# Patient Record
Sex: Female | Born: 1935 | Race: White | Hispanic: No | Marital: Married | Smoking: Never smoker
Health system: Southern US, Community
[De-identification: ages and names within clinical notes are randomized; demographics above are authoritative.]

## PROBLEM LIST (undated history)

## (undated) DIAGNOSIS — M5412 Radiculopathy, cervical region: Secondary | ICD-10-CM

## (undated) DIAGNOSIS — I10 Essential (primary) hypertension: Secondary | ICD-10-CM

## (undated) DIAGNOSIS — K589 Irritable bowel syndrome without diarrhea: Secondary | ICD-10-CM

## (undated) DIAGNOSIS — T7840XA Allergy, unspecified, initial encounter: Secondary | ICD-10-CM

## (undated) DIAGNOSIS — H269 Unspecified cataract: Secondary | ICD-10-CM

## (undated) DIAGNOSIS — D649 Anemia, unspecified: Secondary | ICD-10-CM

## (undated) DIAGNOSIS — I059 Rheumatic mitral valve disease, unspecified: Secondary | ICD-10-CM

## (undated) DIAGNOSIS — E039 Hypothyroidism, unspecified: Secondary | ICD-10-CM

## (undated) DIAGNOSIS — Z7901 Long term (current) use of anticoagulants: Secondary | ICD-10-CM

## (undated) DIAGNOSIS — Z5189 Encounter for other specified aftercare: Secondary | ICD-10-CM

## (undated) DIAGNOSIS — E785 Hyperlipidemia, unspecified: Secondary | ICD-10-CM

## (undated) DIAGNOSIS — I2699 Other pulmonary embolism without acute cor pulmonale: Secondary | ICD-10-CM

## (undated) DIAGNOSIS — D131 Benign neoplasm of stomach: Secondary | ICD-10-CM

## (undated) DIAGNOSIS — J449 Chronic obstructive pulmonary disease, unspecified: Secondary | ICD-10-CM

## (undated) DIAGNOSIS — G629 Polyneuropathy, unspecified: Secondary | ICD-10-CM

## (undated) DIAGNOSIS — K317 Polyp of stomach and duodenum: Secondary | ICD-10-CM

## (undated) DIAGNOSIS — K219 Gastro-esophageal reflux disease without esophagitis: Secondary | ICD-10-CM

## (undated) DIAGNOSIS — M48061 Spinal stenosis, lumbar region without neurogenic claudication: Secondary | ICD-10-CM

## (undated) DIAGNOSIS — F419 Anxiety disorder, unspecified: Secondary | ICD-10-CM

## (undated) DIAGNOSIS — K439 Ventral hernia without obstruction or gangrene: Secondary | ICD-10-CM

## (undated) DIAGNOSIS — D126 Benign neoplasm of colon, unspecified: Secondary | ICD-10-CM

## (undated) DIAGNOSIS — M1990 Unspecified osteoarthritis, unspecified site: Secondary | ICD-10-CM

## (undated) DIAGNOSIS — I517 Cardiomegaly: Secondary | ICD-10-CM

## (undated) DIAGNOSIS — C4490 Unspecified malignant neoplasm of skin, unspecified: Secondary | ICD-10-CM

## (undated) HISTORY — DX: Cardiomegaly: I51.7

## (undated) HISTORY — DX: Hypothyroidism, unspecified: E03.9

## (undated) HISTORY — DX: Other pulmonary embolism without acute cor pulmonale: I26.99

## (undated) HISTORY — DX: Ventral hernia without obstruction or gangrene: K43.9

## (undated) HISTORY — PX: APPENDECTOMY: SHX54

## (undated) HISTORY — DX: Unspecified malignant neoplasm of skin, unspecified: C44.90

## (undated) HISTORY — DX: Hyperlipidemia, unspecified: E78.5

## (undated) HISTORY — DX: Allergy, unspecified, initial encounter: T78.40XA

## (undated) HISTORY — DX: Essential (primary) hypertension: I10

## (undated) HISTORY — DX: Long term (current) use of anticoagulants: Z79.01

## (undated) HISTORY — DX: Rheumatic mitral valve disease, unspecified: I05.9

## (undated) HISTORY — DX: Chronic obstructive pulmonary disease, unspecified: J44.9

## (undated) HISTORY — DX: Encounter for other specified aftercare: Z51.89

## (undated) HISTORY — DX: Unspecified osteoarthritis, unspecified site: M19.90

## (undated) HISTORY — DX: Irritable bowel syndrome, unspecified: K58.9

## (undated) HISTORY — PX: CATARACT EXTRACTION: SUR2

## (undated) HISTORY — DX: Polyp of stomach and duodenum: K31.7

## (undated) HISTORY — DX: Polyneuropathy, unspecified: G62.9

## (undated) HISTORY — PX: CHOLECYSTECTOMY: SHX55

## (undated) HISTORY — DX: Radiculopathy, cervical region: M54.12

## (undated) HISTORY — DX: Spinal stenosis, lumbar region without neurogenic claudication: M48.061

## (undated) HISTORY — DX: Anxiety disorder, unspecified: F41.9

## (undated) HISTORY — DX: Benign neoplasm of colon, unspecified: D12.6

## (undated) HISTORY — DX: Unspecified cataract: H26.9

## (undated) HISTORY — PX: ABDOMINAL HYSTERECTOMY: SHX81

## (undated) HISTORY — DX: Benign neoplasm of stomach: D13.1

## (undated) HISTORY — DX: Anemia, unspecified: D64.9

## (undated) HISTORY — PX: VARICOSE VEIN SURGERY: SHX832

## (undated) HISTORY — DX: Gastro-esophageal reflux disease without esophagitis: K21.9

---

## 2004-05-24 ENCOUNTER — Encounter: Payer: Self-pay | Admitting: Internal Medicine

## 2004-05-24 HISTORY — PX: COLONOSCOPY: SHX5424

## 2005-11-03 ENCOUNTER — Ambulatory Visit: Payer: Self-pay | Admitting: Internal Medicine

## 2005-12-17 ENCOUNTER — Ambulatory Visit: Payer: Self-pay | Admitting: Internal Medicine

## 2006-01-06 ENCOUNTER — Ambulatory Visit: Payer: Self-pay | Admitting: Internal Medicine

## 2006-01-06 ENCOUNTER — Encounter (INDEPENDENT_AMBULATORY_CARE_PROVIDER_SITE_OTHER): Payer: Self-pay | Admitting: Specialist

## 2006-01-21 ENCOUNTER — Ambulatory Visit: Payer: Self-pay | Admitting: Internal Medicine

## 2007-12-07 ENCOUNTER — Ambulatory Visit: Payer: Self-pay | Admitting: Internal Medicine

## 2007-12-10 ENCOUNTER — Ambulatory Visit (HOSPITAL_COMMUNITY): Admission: RE | Admit: 2007-12-10 | Discharge: 2007-12-10 | Payer: Self-pay | Admitting: Internal Medicine

## 2007-12-31 ENCOUNTER — Encounter: Payer: Self-pay | Admitting: Internal Medicine

## 2007-12-31 ENCOUNTER — Ambulatory Visit (HOSPITAL_COMMUNITY): Admission: RE | Admit: 2007-12-31 | Discharge: 2007-12-31 | Payer: Self-pay | Admitting: Internal Medicine

## 2007-12-31 HISTORY — PX: UPPER GASTROINTESTINAL ENDOSCOPY: SHX188

## 2008-01-05 ENCOUNTER — Ambulatory Visit: Payer: Self-pay | Admitting: Internal Medicine

## 2008-01-10 ENCOUNTER — Ambulatory Visit: Payer: Self-pay | Admitting: Vascular Surgery

## 2008-01-10 ENCOUNTER — Emergency Department (HOSPITAL_COMMUNITY): Admission: EM | Admit: 2008-01-10 | Discharge: 2008-01-10 | Payer: Self-pay | Admitting: Emergency Medicine

## 2008-01-10 ENCOUNTER — Encounter (INDEPENDENT_AMBULATORY_CARE_PROVIDER_SITE_OTHER): Payer: Self-pay | Admitting: Emergency Medicine

## 2008-12-22 DIAGNOSIS — I2699 Other pulmonary embolism without acute cor pulmonale: Secondary | ICD-10-CM

## 2008-12-22 HISTORY — DX: Other pulmonary embolism without acute cor pulmonale: I26.99

## 2009-04-25 DIAGNOSIS — E039 Hypothyroidism, unspecified: Secondary | ICD-10-CM | POA: Insufficient documentation

## 2009-04-25 DIAGNOSIS — I059 Rheumatic mitral valve disease, unspecified: Secondary | ICD-10-CM | POA: Insufficient documentation

## 2009-04-25 DIAGNOSIS — K439 Ventral hernia without obstruction or gangrene: Secondary | ICD-10-CM | POA: Insufficient documentation

## 2009-04-25 DIAGNOSIS — I82409 Acute embolism and thrombosis of unspecified deep veins of unspecified lower extremity: Secondary | ICD-10-CM

## 2009-04-25 DIAGNOSIS — I10 Essential (primary) hypertension: Secondary | ICD-10-CM | POA: Insufficient documentation

## 2009-04-25 DIAGNOSIS — I2699 Other pulmonary embolism without acute cor pulmonale: Secondary | ICD-10-CM | POA: Insufficient documentation

## 2009-04-25 DIAGNOSIS — K219 Gastro-esophageal reflux disease without esophagitis: Secondary | ICD-10-CM

## 2009-04-25 DIAGNOSIS — Z8719 Personal history of other diseases of the digestive system: Secondary | ICD-10-CM | POA: Insufficient documentation

## 2009-04-26 ENCOUNTER — Ambulatory Visit: Payer: Self-pay | Admitting: Cardiology

## 2009-05-11 ENCOUNTER — Ambulatory Visit: Payer: Self-pay | Admitting: Internal Medicine

## 2009-05-11 ENCOUNTER — Encounter: Payer: Self-pay | Admitting: Cardiology

## 2009-05-11 ENCOUNTER — Ambulatory Visit: Payer: Self-pay

## 2009-05-11 DIAGNOSIS — K589 Irritable bowel syndrome without diarrhea: Secondary | ICD-10-CM

## 2009-05-11 LAB — CONVERTED CEMR LAB
Basophils Absolute: 0 10*3/uL (ref 0.0–0.1)
Basophils Relative: 0.4 % (ref 0.0–3.0)
CO2: 31 meq/L (ref 19–32)
Calcium: 9 mg/dL (ref 8.4–10.5)
Chloride: 107 meq/L (ref 96–112)
Eosinophils Absolute: 0.2 10*3/uL (ref 0.0–0.7)
Eosinophils Relative: 2 % (ref 0.0–5.0)
HCT: 38.8 % (ref 36.0–46.0)
Hemoglobin: 12.8 g/dL (ref 12.0–15.0)
Lipase: 19 units/L (ref 11.0–59.0)
Lymphocytes Relative: 32.9 % (ref 12.0–46.0)
Lymphs Abs: 3.2 10*3/uL (ref 0.7–4.0)
MCHC: 33.1 g/dL (ref 30.0–36.0)
MCV: 88.1 fL (ref 78.0–100.0)
Monocytes Absolute: 0.8 10*3/uL (ref 0.1–1.0)
Neutro Abs: 5.4 10*3/uL (ref 1.4–7.7)
Neutrophils Relative %: 56.5 % (ref 43.0–77.0)
Platelets: 269 10*3/uL (ref 150.0–400.0)
RDW: 13.5 % (ref 11.5–14.6)
Total Bilirubin: 0.4 mg/dL (ref 0.3–1.2)
Total Protein: 6.4 g/dL (ref 6.0–8.3)

## 2009-05-14 ENCOUNTER — Ambulatory Visit: Payer: Self-pay | Admitting: Cardiovascular Disease

## 2009-05-21 ENCOUNTER — Encounter: Payer: Self-pay | Admitting: Cardiology

## 2009-05-25 ENCOUNTER — Encounter: Payer: Self-pay | Admitting: Internal Medicine

## 2009-06-22 ENCOUNTER — Ambulatory Visit: Payer: Self-pay | Admitting: Internal Medicine

## 2009-06-22 DIAGNOSIS — Z860100 Personal history of colon polyps, unspecified: Secondary | ICD-10-CM | POA: Insufficient documentation

## 2009-11-02 ENCOUNTER — Encounter (INDEPENDENT_AMBULATORY_CARE_PROVIDER_SITE_OTHER): Payer: Self-pay | Admitting: *Deleted

## 2010-01-07 ENCOUNTER — Ambulatory Visit: Payer: Self-pay | Admitting: Cardiology

## 2010-06-04 ENCOUNTER — Encounter: Payer: Self-pay | Admitting: Cardiology

## 2010-07-16 ENCOUNTER — Encounter: Admission: RE | Admit: 2010-07-16 | Discharge: 2010-07-16 | Payer: Self-pay | Admitting: Internal Medicine

## 2011-01-23 NOTE — Consult Note (Signed)
Summary: [facility]  [facility]   Imported By: Roderic Ovens 07/10/2010 12:01:00  _____________________________________________________________________  External Attachment:    Type:   Image     Comment:   External Document

## 2011-01-23 NOTE — Assessment & Plan Note (Signed)
Summary: f81m   Visit Type:  Follow-up Referring Provider:  n/a Primary Provider:  Foye Deer, MD   History of Present Illness: Patient is living in [place], and just moved back three to five years ago.   Patient is working three days per week, and works for [employer].  No chest pain, occasionally feels like heart running a way, mostly at nighttime.  Otherwise, doing well.    Current Medications (verified): 1)  Furosemide 40 Mg Tabs (Furosemide) .... Take 1 Tablet By Mouth Once A Day 2)  Nitroglycerin 0.4 Mg Subl (Nitroglycerin) .... One Tablet Under Tongue Every 5 Minutes As Needed For Chest Pain---May Repeat Times Three 3)  Lorazepam 1 Mg Tabs (Lorazepam) .... Take 1 Tablet By Mouth Two Times A Day 4)  Temazepam 30 Mg Caps (Temazepam) .... Take 1 Tablet By Mouth Once A Day At Bedtime 5)  Prilosec Otc 20 Mg Tbec (Omeprazole Magnesium) .... Take 1 Tablet By Mouth Once A Day 6)  Metoprolol Succinate 100 Mg Xr24h-Tab (Metoprolol Succinate) .... Take 1 Tablet By Mouth Once A Day 7)  Losartan Potassium 25 Mg Tabs (Losartan Potassium) .... Take One Tablet By Mouth Once Daily. 8)  Synthroid 50 Mcg Tabs (Levothyroxine Sodium) .... Take 1 Tablet By Mouth Once A Day 9)  Warfarin Sodium 6 Mg Tabs (Warfarin Sodium) .... Use As Directed By Anticoagulation Clinic 10)  Vitamin B12 Shot .... Once A Month 11)  Hyoscyamine Sulfate 0.125 Mg  Tabs (Hyoscyamine Sulfate) .... Take 1-2 Tablets Every 4-6 Hours As Needed For Abdominal Pain 12)  Amlodipine Besylate 5 Mg Tabs (Amlodipine Besylate) .... Take One Tablet By Mouth Daily 13)  Tramadol Hcl 50 Mg Tabs (Tramadol Hcl) .... As Needed 14)  Warfarin Sodium 1 Mg Tabs (Warfarin Sodium) .... Use As Directed By Anticoagualtion Clinic  Allergies: 1)  ! * Amitriptyline 2)  ! Ampicillin 3)  ! Aspirin 4)  ! Caffeine 5)  ! Penicillin 6)  ! Codeine  Vital Signs:  Patient profile:   77 year old female Height:      68 inches Weight:      181  pounds BMI:     27.62 Pulse rate:   77 / minute Pulse rhythm:   regular BP sitting:   120 / 80 Cuff size:   regular  Vitals Entered By: Laurance Flatten CMA (January 07, 2010 2:45 PM)  Physical Exam  General:  Well developed, well nourished, in no acute distress. Head:  normocephalic and atraumatic Eyes:  PERRLA/EOM intact; conjunctiva and lids normal. Ears:  TM's intact and clear with normal canals and hearing Lungs:  Clear bilaterally to auscultation and percussion. Heart:  Non-displaced PMI, chest non-tender; regular rate and rhythm, S1, S2 without murmurs, rubs or gallops. Carotid upstroke normal, no bruit. Normal abdominal aortic size, no bruits. Femorals normal pulses, no bruits. Pedals normal pulses. No edema, no varicosities. Abdomen:  Bowel sounds positive; abdomen soft and non-tender without masses, organomegaly, or hernias noted. No hepatosplenomegaly. Msk:  Back normal, normal gait. Muscle strength and tone normal. Extremities:  No clubbing or cyanosis. Neurologic:  Alert and oriented x 3.   EKG  Procedure date:  01/07/2010  Findings:      NSR.  WNL.    Impression & Recommendations:  Problem # 1:  PULMONARY EMBOLISM (ICD-415.19) Requires long term anticoagulation.  ? workup in past.  Says this was done at [facility] in [facility]Onc.  No definite source of coagulopathy.  Prob on life long anticoagulation.  Her updated medication list for this problem includes:    Warfarin Sodium 6 Mg Tabs (Warfarin sodium) ..... Use as directed by anticoagulation clinic    Warfarin Sodium 1 Mg Tabs (Warfarin sodium) ..... Use as directed by anticoagualtion clinic  Problem # 2:  HYPERTENSION (ICD-401.9) Well controlled. Her updated medication list for this problem includes:    Furosemide 40 Mg Tabs (Furosemide) .Marland Kitchen... Take 1 tablet by mouth once a day    Metoprolol Succinate 100 Mg Xr24h-tab (Metoprolol succinate) .Marland Kitchen... Take 1 tablet by mouth once a day    Losartan Potassium 25 Mg Tabs  (Losartan potassium) .Marland Kitchen... Take one tablet by mouth once daily.    Amlodipine Besylate 5 Mg Tabs (Amlodipine besylate) .Marland Kitchen... Take one tablet by mouth daily  Orders: EKG w/ Interpretation (93000)  Problem # 3:  DVT (ICD-453.40) See first  Reamains on warfarin.  Patient Instructions: 1)  Your physician recommends that you continue on your current medications as directed. Please refer to the Current Medication list given to you today. 2)  Your physician wants you to follow-up in:   1 YEAR. You will receive a reminder letter in the mail two months in advance. If you don't receive a letter, please call our office to schedule the follow-up appointment.  Prevention & Chronic Care Immunizations   Influenza vaccine: Not documented    Tetanus booster: Not documented    Pneumococcal vaccine: Not documented    H. zoster vaccine: Not documented  Colorectal Screening   Hemoccult: Not documented    Colonoscopy: Not documented  Other Screening   Pap smear: Not documented    Mammogram: Not documented    DXA bone density scan: Not documented   Smoking status: Not documented  Lipids   Total Cholesterol: Not documented   LDL: Not documented   LDL Direct: Not documented   HDL: Not documented   Triglycerides: Not documented  Hypertension   Last Blood Pressure: 120 / 80  (01/07/2010)   Serum creatinine: 0.9  (05/11/2009)   Serum potassium 3.8  (05/11/2009)  Self-Management Support :    Hypertension self-management support: Not documented

## 2011-04-22 ENCOUNTER — Encounter: Payer: Self-pay | Admitting: Cardiology

## 2011-04-23 ENCOUNTER — Encounter: Payer: Self-pay | Admitting: Cardiology

## 2011-04-24 ENCOUNTER — Encounter: Payer: Self-pay | Admitting: Cardiology

## 2011-04-24 ENCOUNTER — Ambulatory Visit (INDEPENDENT_AMBULATORY_CARE_PROVIDER_SITE_OTHER): Payer: Medicare Other | Admitting: Cardiology

## 2011-04-24 VITALS — BP 118/70 | HR 64 | Ht 68.0 in | Wt 178.0 lb

## 2011-04-24 DIAGNOSIS — I10 Essential (primary) hypertension: Secondary | ICD-10-CM

## 2011-04-24 DIAGNOSIS — R002 Palpitations: Secondary | ICD-10-CM

## 2011-04-24 DIAGNOSIS — I2699 Other pulmonary embolism without acute cor pulmonale: Secondary | ICD-10-CM

## 2011-04-24 NOTE — Progress Notes (Signed)
HPI:    Current Outpatient Prescriptions  Medication Sig Dispense Refill  . amLODipine (NORVASC) 5 MG tablet Take 5 mg by mouth daily.        . Cyanocobalamin (VITAMIN B-12 IJ) Inject as directed every 30 (thirty) days.        . furosemide (LASIX) 40 MG tablet Take 40 mg by mouth daily.        Marland Kitchen levothyroxine (SYNTHROID, LEVOTHROID) 50 MCG tablet Take 25 mcg by mouth daily.       Marland Kitchen LORazepam (ATIVAN) 1 MG tablet Take 1 mg by mouth every 8 (eight) hours.        Marland Kitchen losartan (COZAAR) 25 MG tablet Take 25 mg by mouth daily.        . metoprolol (LOPRESSOR) 50 MG tablet Take 50 mg by mouth 2 (two) times daily.        . nitroGLYCERIN (NITROSTAT) 0.4 MG SL tablet Place 0.4 mg under the tongue every 5 (five) minutes as needed.        Marland Kitchen omeprazole (PRILOSEC) 20 MG capsule Take 20 mg by mouth daily.        . temazepam (RESTORIL) 30 MG capsule Take 30 mg by mouth at bedtime as needed.        . warfarin (COUMADIN) 1 MG tablet Take 1 mg by mouth as directed.        Marland Kitchen DISCONTD: hyoscyamine (LEVSIN) 0.125 MG/5ML ELIX Take 125 mcg by mouth every 4 (four) hours as needed.        Marland Kitchen DISCONTD: metoprolol (LOPRESSOR) 100 MG tablet Take 100 mg by mouth daily.          Allergies  Allergen Reactions  . Ampicillin   . Aspirin   . Caffeine   . Codeine   . Penicillins     Past Medical History  Diagnosis Date  . Hypertension   . Pulmonary embolism   . Mitral valve prolapse   . DVT (deep venous thrombosis)   . Hypothyroidism   . Acid reflux   . Gastric polyps   . Ventral hernia   . Adenomatous colon polyp   . COPD (chronic obstructive pulmonary disease)   . Skin cancer   . Hyperlipidemia   . H/O: hysterectomy     Past Surgical History  Procedure Date  . Appendectomy   . Cesarean section   . Cataract surgery   . Cholecystectomy   . Varicose vein surgery     Family History  Problem Relation Age of Onset  . Heart disease Mother   . Clotting disorder Mother   . Cancer Sister     breast  .  Diabetes Sister   . Heart disease Sister   . Diabetes Brother     History   Social History  . Marital Status: Married    Spouse Name: N/A    Number of Children: N/A  . Years of Education: N/A   Occupational History  . baker at [employer]     Social History Main Topics  . Smoking status: Never Smoker   . Smokeless tobacco: Not on file  . Alcohol Use: No  . Drug Use: No  . Sexually Active: Not on file   Other Topics Concern  . Not on file   Social History Narrative  . No narrative on file    ROS: Please see the HPI.  All other systems reviewed and negative.  PHYSICAL EXAM:  BP 118/70  Pulse 64  Ht 5\' 8"  (1.727  m)  Wt 178 lb (80.74 kg)  BMI 27.06 kg/m2  General: Well developed, well nourished, in no acute distress. Head:  Normocephalic and atraumatic. Neck: no JVD Lungs: Clear to auscultation and percussion. Heart: Normal S1 and S2.  No murmur, rubs or gallops.  Abdomen:  Normal bowel sounds; soft; non tender; no organomegaly Pulses: Pulses normal in all 4 extremities. Extremities: No clubbing or cyanosis. No edema. Neurologic: Alert and oriented x 3.  EKG:  NSR.  Occasional PVCs.  Otherwise normal tracing.   ASSESSMENT AND PLAN:

## 2011-04-24 NOTE — Patient Instructions (Signed)
Your physician wants you to follow-up in: 1 YEAR.  You will receive a reminder letter in the mail two months in advance. If you don't receive a letter, please call our office to schedule the follow-up appointment.  Your physician has recommended that you wear an event monitor. Event monitors are medical devices that record the heart's electrical activity. Doctors most often [place] these monitors to diagnose arrhythmias. Arrhythmias are problems with the speed or rhythm of the heartbeat. The monitor is a small, portable device. You can wear one while you do your normal daily activities. This is usually used to diagnose what is causing palpitations/syncope (passing out).  Your physician recommends that you continue on your current medications as directed. Please refer to the Current Medication list given to you today.

## 2011-04-24 NOTE — Assessment & Plan Note (Signed)
Important to note.  Occur mainly at night.  She apparently snores a lot and Dr. Tomasa Blase has mentioned a sleep study to her.  Would suggest this be done.  Will place an event monitor.

## 2011-04-24 NOTE — Assessment & Plan Note (Signed)
No obvious recurrence.  On life long anticoagulation.

## 2011-04-24 NOTE — Assessment & Plan Note (Signed)
Controlled.  

## 2011-05-01 ENCOUNTER — Encounter (INDEPENDENT_AMBULATORY_CARE_PROVIDER_SITE_OTHER): Payer: Medicare Other

## 2011-05-01 DIAGNOSIS — R002 Palpitations: Secondary | ICD-10-CM

## 2011-05-06 NOTE — Letter (Signed)
Apr 26, 2009    Barney Drain, MD  [address], [address]  [place], [place] [address]   RE:  Briana Hartman, Briana Hartman  MRN:  [id number]  /  DOB:  1934/07/05   Dear Gala Romney:   Thanks for asking me to see Briana Hartman back in followup.  I do  not have complete access to her records other than what you have as  well, but I will review them in detail.  She appears to be getting along  well.  I am in the process of going ahead and getting a 2-D echo.  We  will let you know what this shows.  Importantly, this woman has had  recurrent clotting disorders, and she notes that when she came off  Coumadin for 5-7 days for a colonoscopy that she had a recurrent DVT.   This would make the question as to whether some genetic testing might be  in order.  I will leave this to your discretion.  One might consider  doing a prothrombin gene mutation, and a factor V Leiden, and/or  consider referral to the [facility] at [facility] for  complete clotting workup.  I do not know if this has been done in the  past.  We will be happy to see her on an intermittent basis, but at the  present time, there are no active issues that need to be addressed.  Thanks so much for allowing me to share in her care.    Sincerely,      Arturo Morton. Riley Kill, MD, [facility]  Electronically Signed    TDS/MedQ  DD: 04/26/2009  DT: 04/26/2009  Job #: [id number]

## 2011-05-06 NOTE — Assessment & Plan Note (Signed)
[facility]                         GASTROENTEROLOGY OFFICE NOTE   NAME:Rosenberg, EMRIE GAYLE                  MRN:          [id number]  DATE:12/07/2007                            DOB:          1934-11-09    CHIEF COMPLAINT:  Epigastric pain.   Ms. Kneale is in need for surveillance of gastric polyps.  She takes  Coumadin, so she was brought into the office prior to scheduling that.  She has had numerous fundic gland and hyperplastic polyps in the stomach  and we were planning on followup.  She has been having some mild  epigastric discomfort for several months now.  She lifts a lot of things  in her work; she is Engineer, production at [employer], and thinks that may be it.  She is  concerned that something may bleed since she is on Coumadin.  She was  told by her primary care physician that she should lose 20 pounds.  She  eats ice cream every night.  Activity is mainly at work it sounds like.   PAST MEDICAL HISTORY:  Reviewed and unchanged from January 21, 2006 and  November 03, 2005.  She takes Coumadin for pulmonary embolism and  previous DVTs.   MEDICATIONS:  Listed and reviewed in the chart.  She is now off Lopid  and off Premarin compared to her last visit in January 2007.   ALLERGIES:  She remains allergic to CODEINE, AMOXICILLIN and is  sensitive to ASPIRIN.   PHYSICAL EXAMINATION:  Physical exam shows a well-developed, obese,  elderly white woman.  Height 5 feet 8-1/2 inches.  Weight 178 pounds.  Pulse 72, blood pressure 128/78.  ABDOMEN:  Soft.  There is a right upper quadrant vertical scar just to  the right of the midline and just to the right of that is a small  ventral hernia approaching the size of a small baseball.  It is soft and  reducible and minimally tender.  There is no organomegaly, splenomegaly  or mass otherwise.   ASSESSMENT:  1. Mildly symptomatic ventral hernia.  2. History of gastric polyps.  3. Chronic Coumadin therapy for  previous deep venous thrombosis and      pulmonary emboli.  4. She does have some complaints of dysphagia not mentioned above.      Water in particular, even when it is warm, can cause problems and      it stops, as well as some solid food.  She has had these problems      before.  When I saw her last year, the symptoms improved from the      time of office visit and the time of endoscopy and we did not      dilate.   PLAN:  1. Hold Coumadin for 3 days prior to EGD with planned surveillance of      gastric polyps and possible esophageal dilation.  2. Barium swallow to assess motility; we will give a tablet.  Further      plans pending that.  The barium swallow will be completed prior to      the EGD.  Iva Boop, MD,FACG  Electronically Signed    CEG/MedQ  DD: 12/07/2007  DT: 12/08/2007  Job #: [id number]   cc:   Foye Deer MD

## 2011-05-29 ENCOUNTER — Telehealth: Payer: Self-pay | Admitting: Cardiology

## 2011-05-29 NOTE — Telephone Encounter (Signed)
Pt would like results of monitor

## 2011-05-29 NOTE — Telephone Encounter (Signed)
Reviewed monitor results which demonstrate NSR with PAC's and PVC's.  Pt aware that Dr Riley Kill has not reviewed the results as of yet but will be in the office tomorrow.  I will place the results on his cart for review and pt is aware we will call back with any recommendations.

## 2011-06-02 ENCOUNTER — Telehealth: Payer: Self-pay | Admitting: Cardiology

## 2011-06-02 NOTE — Telephone Encounter (Signed)
Pt had heart monitor about 3 weeks ago, was told by the nurse stuckey would call her Friday and she hasn't heard from him, really anxious-pls call

## 2011-06-02 NOTE — Telephone Encounter (Signed)
I spoke with the pt and made her aware of event monitor results.  The monitor showed PAC's and PVC's per Dr Riley Kill.  I spoke with the pt about stopping all caffeine intake, managing anxiety and getting enough rest.

## 2011-09-02 ENCOUNTER — Ambulatory Visit: Payer: Medicare Other | Admitting: Internal Medicine

## 2011-09-02 ENCOUNTER — Institutional Professional Consult (permissible substitution): Payer: Medicare Other | Admitting: Critical Care Medicine

## 2011-09-11 LAB — PROTIME-INR: Prothrombin Time: 16.7 — ABNORMAL HIGH

## 2011-09-11 LAB — BASIC METABOLIC PANEL
BUN: 13
Calcium: 9.2
GFR calc Af Amer: >60
GFR calc non Af Amer: 55 — ABNORMAL LOW
Glucose, Bld: 99

## 2011-09-11 LAB — CBC
HCT: 39.1
Hemoglobin: 13.4
MCHC: 34.3
Platelets: 256

## 2011-09-11 LAB — DIFFERENTIAL
Basophils Absolute: 0
Lymphs Abs: 2.6
Monocytes Absolute: 0.8
Neutrophils Relative %: 67

## 2011-09-14 ENCOUNTER — Encounter: Payer: Self-pay | Admitting: Internal Medicine

## 2011-09-14 DIAGNOSIS — Z0000 Encounter for general adult medical examination without abnormal findings: Secondary | ICD-10-CM | POA: Insufficient documentation

## 2011-09-16 ENCOUNTER — Encounter: Payer: Self-pay | Admitting: Internal Medicine

## 2011-09-16 ENCOUNTER — Ambulatory Visit: Payer: Medicare Other

## 2011-09-16 ENCOUNTER — Ambulatory Visit (INDEPENDENT_AMBULATORY_CARE_PROVIDER_SITE_OTHER): Payer: Medicare Other | Admitting: Internal Medicine

## 2011-09-16 VITALS — BP 122/78 | HR 68 | Temp 98.6°F | Ht 68.0 in | Wt 181.0 lb

## 2011-09-16 DIAGNOSIS — M5412 Radiculopathy, cervical region: Secondary | ICD-10-CM | POA: Insufficient documentation

## 2011-09-16 DIAGNOSIS — D649 Anemia, unspecified: Secondary | ICD-10-CM

## 2011-09-16 DIAGNOSIS — M48061 Spinal stenosis, lumbar region without neurogenic claudication: Secondary | ICD-10-CM | POA: Insufficient documentation

## 2011-09-16 DIAGNOSIS — E039 Hypothyroidism, unspecified: Secondary | ICD-10-CM | POA: Insufficient documentation

## 2011-09-16 DIAGNOSIS — J449 Chronic obstructive pulmonary disease, unspecified: Secondary | ICD-10-CM | POA: Insufficient documentation

## 2011-09-16 DIAGNOSIS — E1140 Type 2 diabetes mellitus with diabetic neuropathy, unspecified: Secondary | ICD-10-CM | POA: Insufficient documentation

## 2011-09-16 DIAGNOSIS — C4490 Unspecified malignant neoplasm of skin, unspecified: Secondary | ICD-10-CM | POA: Insufficient documentation

## 2011-09-16 DIAGNOSIS — H9191 Unspecified hearing loss, right ear: Secondary | ICD-10-CM | POA: Insufficient documentation

## 2011-09-16 DIAGNOSIS — T7840XA Allergy, unspecified, initial encounter: Secondary | ICD-10-CM | POA: Insufficient documentation

## 2011-09-16 DIAGNOSIS — R609 Edema, unspecified: Secondary | ICD-10-CM | POA: Insufficient documentation

## 2011-09-16 DIAGNOSIS — G609 Hereditary and idiopathic neuropathy, unspecified: Secondary | ICD-10-CM

## 2011-09-16 DIAGNOSIS — F411 Generalized anxiety disorder: Secondary | ICD-10-CM

## 2011-09-16 DIAGNOSIS — K219 Gastro-esophageal reflux disease without esophagitis: Secondary | ICD-10-CM | POA: Insufficient documentation

## 2011-09-16 DIAGNOSIS — Z7901 Long term (current) use of anticoagulants: Secondary | ICD-10-CM | POA: Insufficient documentation

## 2011-09-16 DIAGNOSIS — G629 Polyneuropathy, unspecified: Secondary | ICD-10-CM

## 2011-09-16 DIAGNOSIS — Z9229 Personal history of other drug therapy: Secondary | ICD-10-CM | POA: Insufficient documentation

## 2011-09-16 DIAGNOSIS — I10 Essential (primary) hypertension: Secondary | ICD-10-CM | POA: Insufficient documentation

## 2011-09-16 DIAGNOSIS — Z23 Encounter for immunization: Secondary | ICD-10-CM

## 2011-09-16 DIAGNOSIS — F419 Anxiety disorder, unspecified: Secondary | ICD-10-CM | POA: Insufficient documentation

## 2011-09-16 DIAGNOSIS — Z0000 Encounter for general adult medical examination without abnormal findings: Secondary | ICD-10-CM

## 2011-09-16 DIAGNOSIS — M1990 Unspecified osteoarthritis, unspecified site: Secondary | ICD-10-CM | POA: Insufficient documentation

## 2011-09-16 DIAGNOSIS — R600 Localized edema: Secondary | ICD-10-CM

## 2011-09-16 DIAGNOSIS — E785 Hyperlipidemia, unspecified: Secondary | ICD-10-CM | POA: Insufficient documentation

## 2011-09-16 LAB — URINALYSIS, ROUTINE W REFLEX MICROSCOPIC
Bilirubin Urine: NEGATIVE
Ketones, ur: NEGATIVE
Leukocytes, UA: NEGATIVE
Specific Gravity, Urine: 1.015 (ref 1.000–1.030)
Urine Glucose: NEGATIVE
pH: 6 (ref 5.0–8.0)

## 2011-09-16 LAB — CBC WITH DIFFERENTIAL/PLATELET
Basophils Absolute: 0.1 10*3/uL (ref 0.0–0.1)
Eosinophils Relative: 2.7 % (ref 0.0–5.0)
HCT: 40.9 % (ref 36.0–46.0)
Hemoglobin: 13.2 g/dL (ref 12.0–15.0)
Lymphs Abs: 4 10*3/uL (ref 0.7–4.0)
MCV: 85.9 fl (ref 78.0–100.0)
Monocytes Absolute: 0.9 10*3/uL (ref 0.1–1.0)
Neutro Abs: 6 10*3/uL (ref 1.4–7.7)
Platelets: 299 10*3/uL (ref 150.0–400.0)
RDW: 15.7 % — ABNORMAL HIGH (ref 11.5–14.6)

## 2011-09-16 LAB — PROTIME-INR: Prothrombin Time: 19.4 seconds — ABNORMAL HIGH (ref 11.6–15.2)

## 2011-09-16 LAB — BASIC METABOLIC PANEL
Calcium: 9.7 mg/dL (ref 8.4–10.5)
GFR: 61.66 mL/min (ref >60.00)
Potassium: 4.1 mEq/L (ref 3.5–5.1)
Sodium: 143 mEq/L (ref 135–145)

## 2011-09-16 LAB — HEPATIC FUNCTION PANEL
AST: 16 U/L (ref 0–37)
Alkaline Phosphatase: 59 U/L (ref 39–117)
Total Bilirubin: 0.4 mg/dL (ref 0.3–1.2)

## 2011-09-16 LAB — LIPID PANEL
HDL: 41.3 mg/dL (ref >39.00)
Total CHOL/HDL Ratio: 5
VLDL: 43.4 mg/dL — ABNORMAL HIGH (ref 0.0–40.0)

## 2011-09-16 LAB — TSH: TSH: 2.82 u[IU]/mL (ref 0.35–5.50)

## 2011-09-16 LAB — IBC PANEL
Iron: 43 ug/dL (ref 42–145)
Transferrin: 268.4 mg/dL (ref 212.0–360.0)

## 2011-09-16 LAB — BRAIN NATRIURETIC PEPTIDE: Pro B Natriuretic peptide (BNP): 99 pg/mL (ref 0.0–100.0)

## 2011-09-16 MED ORDER — FUROSEMIDE 40 MG PO TABS: 40.0000 mg | ORAL_TABLET | Freq: Two times a day (BID) | ORAL | Status: DC

## 2011-09-16 MED ORDER — AMITRIPTYLINE HCL 50 MG PO TABS: 50.0000 mg | ORAL_TABLET | Freq: Every day | ORAL | Status: DC

## 2011-09-16 MED ORDER — PNEUMOCOCCAL VAC POLYVALENT 25 MCG/0.5ML IJ INJ: 0.5000 mL | INJECTION | Freq: Once | INTRAMUSCULAR | Status: DC

## 2011-09-16 NOTE — Assessment & Plan Note (Signed)
With pain at night - for elavil 50 qhs,  to f/u any worsening symptoms or concerns

## 2011-09-16 NOTE — Assessment & Plan Note (Signed)
Moderate, situanally worse, declines further tx at this tim

## 2011-09-16 NOTE — Assessment & Plan Note (Signed)
stable overall by hx and exam, most recent data reviewed with pt, and pt to continue medical treatment as before  For INR today, and refer to coumadin clinic

## 2011-09-16 NOTE — Assessment & Plan Note (Addendum)
To also check iron/b12, no overt bleeding, bruising,  to f/u any worsening symptoms or concerns

## 2011-09-16 NOTE — Progress Notes (Signed)
Subjective:    Patient ID: Briana Hartman, female    DOB: 1934/04/17, 77 y.o.   MRN: [id number]  HPI  Here for wellness and f/u;  Overall doing ok;  Pt denies CP, worsening SOB, DOE, wheezing, orthopnea, PND, , palpitations, dizziness or syncope but has had increased LE edema that "stays" instead of off and on with her lasix.  Pt denies neurological change such as new Headache, facial weakness.  Pt denies polydipsia, polyuria, or low sugar symptoms. Pt states overall good compliance with treatment and medications, good tolerability, and trying to follow lower cholesterol, DM diet.  Pt denies worsening depressive symptoms, suicidal ideation or panic. No fever, wt loss, night sweats, loss of appetite, or other constitutional symptoms.  Pt states good ability with ADL's, low fall risk, home safety reviewed and adequate, no significant changes in hearing or vision, and occasionally active with exercise.  Husband has been ill since late June, she has been standing quite bit, last coumadin check late July, had to move out of her house due to mold which affected her husband.  Current coumadin 6 mg per day. No overt bleeding or bruising.  Normally has her coumadin checked regularly, just not recently due to husband situation.  Also with LUE numbness that seems postiional in that sh has numbness to keepng the limb more dependent, resolves with raising the limb above the shoulder level. For 2 mo, with some mild weakness intermittent   Did have echo and heart monitor recently - sees Dr Riley Kill in [place], and Dr Brennan Bailey.  Also asks for some type of pain medication to help the neuropathic pain at night.  Due for flu and pneumovax,.  Last colonscopy 2005 at [facility], with rec for f/u at 5 yrs, now sees Dr Brennan Bailey Past Medical History  Diagnosis Date  . Pulmonary embolism   . Mitral valve prolapse   . DVT (deep venous thrombosis)   . Gastric polyps   . Ventral hernia   . Adenomatous colon polyp   .  H/O: hysterectomy   . COLONIC POLYPS, HX OF 06/22/2009  . DVT 04/25/2009  . GASTRIC POLYP, HX OF 04/25/2009  . Heart palpitations 04/24/2011  . Irritable bowel syndrome 05/11/2009  . MITRAL VALVE PROLAPSE 04/25/2009  . PULMONARY EMBOLISM 04/25/2009  . VENTRAL HERNIA 04/25/2009  . Phlebitis   . Family history of polyps in the colon   . Allergy   . Arthritis   . Skin cancer   . Skin cancer   . COPD (chronic obstructive pulmonary disease)   . Diabetes mellitus   . Acid reflux   . ACID REFLUX DISEASE 04/25/2009  . Hyperlipidemia   . Hypertension   . HYPERTENSION 04/25/2009  . Hypothyroidism   . HYPOTHYROIDISM 04/25/2009  . Lumbar spinal stenosis   . Peripheral neuropathy   . Anxiety   . Asthma   . Anemia    Past Surgical History  Procedure Date  . Appendectomy   . Cesarean section   . Cataract surgery   . Cholecystectomy   . Varicose vein surgery   . Abdominal hysterectomy     reports that she has never smoked. She does not have any smokeless tobacco history on file. She reports that she does not drink alcohol or use illicit drugs. family history includes Cancer in her sister; Clotting disorder in her mother; Diabetes in her brother and sister; and Heart disease in her mother and sister. Allergies  Allergen Reactions  . Ampicillin   .  Aspirin   . Caffeine   . Codeine   . Penicillins    Current Outpatient Prescriptions on File Prior to Visit  Medication Sig Dispense Refill  . amLODipine (NORVASC) 5 MG tablet Take 5 mg by mouth daily.        . Cyanocobalamin (VITAMIN B-12 IJ) Inject as directed every 30 (thirty) days.        . furosemide (LASIX) 40 MG tablet Take 40 mg by mouth daily.        Marland Kitchen levothyroxine (SYNTHROID, LEVOTHROID) 50 MCG tablet Take 25 mcg by mouth daily.       Marland Kitchen LORazepam (ATIVAN) 1 MG tablet Take 1 mg by mouth every 8 (eight) hours.        Marland Kitchen losartan (COZAAR) 25 MG tablet Take 25 mg by mouth daily.        . metoprolol (LOPRESSOR) 50 MG tablet Take 50 mg by mouth 2  (two) times daily.        . nitroGLYCERIN (NITROSTAT) 0.4 MG SL tablet Place 0.4 mg under the tongue every 5 (five) minutes as needed.        Marland Kitchen omeprazole (PRILOSEC) 20 MG capsule Take 20 mg by mouth daily.        . temazepam (RESTORIL) 30 MG capsule Take 30 mg by mouth at bedtime as needed.        . warfarin (COUMADIN) 1 MG tablet Take 1 mg by mouth as directed.         Review of Systems Review of Systems  Constitutional: Negative for diaphoresis, activity change, appetite change and unexpected weight change.  HENT: Negative for hearing loss, ear pain, facial swelling, mouth sores and neck stiffness.   Eyes: Negative for pain, redness and visual disturbance.  Respiratory: Negative for shortness of breath and wheezing.   Cardiovascular: Negative for chest pain and palpitations.  Gastrointestinal: Negative for diarrhea, blood in stool, abdominal distention and rectal pain.  Genitourinary: Negative for hematuria, flank pain and decreased urine volume.  Musculoskeletal: Negative for myalgias and joint swelling.  Skin: Negative for color change and wound.  Neurological: Negative for syncope and numbness.  Hematological: Negative for adenopathy.  Psychiatric/Behavioral: Negative for hallucinations, self-injury, decreased concentration and agitation.     Objective:   Physical Exam BP 122/78  Pulse 68  Temp(Src) 98.6 F (37 C) (Oral)  Ht 5\' 8"  (1.727 m)  Wt 181 lb (82.101 kg)  BMI 27.52 kg/m2  SpO2 96% Physical Exam  VS noted Constitutional: Pt is oriented to person, place, and time. Appears well-developed and well-nourished.  HENT:  Head: Normocephalic and atraumatic.  Right Ear: External ear normal.  Left Ear: External ear normal.  Nose: Nose normal.  Mouth/Throat: Oropharynx is clear and moist.  Eyes: Conjunctivae and EOM are normal. Pupils are equal, round, and reactive to light.  Neck: Normal range of motion. Neck supple. No JVD present. No tracheal deviation present.    Cardiovascular: Normal rate, regular rhythm, normal heart sounds and intact distal pulses.   Pulmonary/Chest: Effort normal and breath sounds normal.  Abdominal: Soft. Bowel sounds are normal. There is no tenderness.  Musculoskeletal: Normal range of motion. Exhibits 2+ edema to knees.  Lymphadenopathy:  Has no cervical adenopathy.  Neurological: Pt is alert and oriented to person, place, and time. Pt has normal reflexes. No cranial nerve deficit.  Skin: Skin is warm and dry. No rash noted.  Psychiatric:  . Behavior is normal. 1-2+ nervous    Assessment & Plan:

## 2011-09-16 NOTE — Patient Instructions (Addendum)
You had the flu shot, and the pneumonia shots today Increase the lasix to 40 mg twice per day, and start the amytriptylene at 50 mg for the leg pain at night (call if you think you need 100 mg) Please go to LAB in the Basement for the blood and/or urine tests to be done today, including the coumadin test Please call the phone number [phone] (the [employer]Tree System) for results of testing in 2-3 days;  When calling, simply dial the number, and when prompted enter the MRN number above (the Medical Record Number) and the # key, then the message should start. You will be contacted regarding the referral for: coumadin clinic, and colonoscopy

## 2011-09-16 NOTE — Assessment & Plan Note (Signed)
Mild LUE symptoms but persistent  - for MRI c-spine and f/u with Dr Newell Coral whom she has seen in the past for lumbar

## 2011-09-16 NOTE — Assessment & Plan Note (Signed)
Recently worsening, no hx of CHF or reduced EF per recent echo per pt with Dr Riley Kill;  ? Worsening pulm HTN vs other such as anemia;  For incresaed lasix 40 bid, f/u 2 wks

## 2011-09-17 ENCOUNTER — Other Ambulatory Visit: Payer: Self-pay | Admitting: Internal Medicine

## 2011-09-17 ENCOUNTER — Telehealth: Payer: Self-pay | Admitting: Internal Medicine

## 2011-09-17 ENCOUNTER — Telehealth: Payer: Self-pay

## 2011-09-17 DIAGNOSIS — Z7901 Long term (current) use of anticoagulants: Secondary | ICD-10-CM

## 2011-09-17 MED ORDER — TRAMADOL HCL 50 MG PO TABS: 50.0000 mg | ORAL_TABLET | Freq: Four times a day (QID) | ORAL | Status: AC | PRN

## 2011-09-17 NOTE — Telephone Encounter (Signed)
Patient called requesting a prescription for pain medication. She received a prescription at her office visit, but informed it was a muscle relaxer and would like pain medication.

## 2011-09-17 NOTE — Telephone Encounter (Signed)
Ok for tramadol prn - done per emr 

## 2011-09-17 NOTE — Telephone Encounter (Signed)
Patient is wanting to discuss colon and she is on coumadin.  Patient is scheduled for an office visit for 10/31/11

## 2011-09-18 NOTE — Telephone Encounter (Signed)
Called patient informed prescription requested has been sent to pharmacy

## 2011-09-19 ENCOUNTER — Ambulatory Visit (INDEPENDENT_AMBULATORY_CARE_PROVIDER_SITE_OTHER): Payer: Medicare Other | Admitting: *Deleted

## 2011-09-19 DIAGNOSIS — I82409 Acute embolism and thrombosis of unspecified deep veins of unspecified lower extremity: Secondary | ICD-10-CM

## 2011-09-19 DIAGNOSIS — Z7901 Long term (current) use of anticoagulants: Secondary | ICD-10-CM

## 2011-09-19 DIAGNOSIS — I2699 Other pulmonary embolism without acute cor pulmonale: Secondary | ICD-10-CM

## 2011-09-19 LAB — POCT INR: INR: 2.1

## 2011-09-21 NOTE — Assessment & Plan Note (Signed)
Overall doing well, age appropriate education and counseling updated, referrals for preventative services and immunizations addressed, dietary and smoking counseling addressed, most recent labs and ECG reviewed.  I have personally reviewed and have noted: 1) the patient's medical and social history 2) The pt's use of alcohol, tobacco, and illicit drugs 3) The patient's current medications and supplements 4) Functional ability including ADL's, fall risk, home safety risk, hearing and visual impairment 5) Diet and physical activities 6) Evidence for depression or mood disorder 7) The patient's height, weight, and BMI have been recorded in the chart I have made referrals, and provided counseling and education based on review of the above Due for colonoscpy - for referral

## 2011-09-23 ENCOUNTER — Ambulatory Visit: Payer: Medicare Other | Admitting: Internal Medicine

## 2011-09-23 ENCOUNTER — Other Ambulatory Visit: Payer: Medicare Other

## 2011-09-30 ENCOUNTER — Institutional Professional Consult (permissible substitution): Payer: Medicare Other | Admitting: Critical Care Medicine

## 2011-09-30 ENCOUNTER — Ambulatory Visit: Payer: Medicare Other | Admitting: Internal Medicine

## 2011-10-03 ENCOUNTER — Ambulatory Visit (INDEPENDENT_AMBULATORY_CARE_PROVIDER_SITE_OTHER): Payer: Medicare Other | Admitting: *Deleted

## 2011-10-03 DIAGNOSIS — Z7901 Long term (current) use of anticoagulants: Secondary | ICD-10-CM

## 2011-10-03 DIAGNOSIS — I2699 Other pulmonary embolism without acute cor pulmonale: Secondary | ICD-10-CM

## 2011-10-03 DIAGNOSIS — I82409 Acute embolism and thrombosis of unspecified deep veins of unspecified lower extremity: Secondary | ICD-10-CM

## 2011-10-03 LAB — POCT INR: INR: 2.6

## 2011-10-07 ENCOUNTER — Other Ambulatory Visit: Payer: Self-pay | Admitting: Pharmacist

## 2011-10-07 MED ORDER — WARFARIN SODIUM 6 MG PO TABS: ORAL_TABLET | ORAL | Status: DC

## 2011-10-14 ENCOUNTER — Encounter: Payer: Medicare Other | Admitting: *Deleted

## 2011-10-29 ENCOUNTER — Ambulatory Visit (INDEPENDENT_AMBULATORY_CARE_PROVIDER_SITE_OTHER): Payer: Medicare Other

## 2011-10-29 DIAGNOSIS — D649 Anemia, unspecified: Secondary | ICD-10-CM

## 2011-10-29 MED ORDER — CYANOCOBALAMIN 1000 MCG/ML IJ SOLN
1000.0000 ug | Freq: Once | INTRAMUSCULAR | Status: AC
  Administered 2011-10-29: 1000 ug via INTRAMUSCULAR

## 2011-10-30 ENCOUNTER — Ambulatory Visit (INDEPENDENT_AMBULATORY_CARE_PROVIDER_SITE_OTHER): Payer: Medicare Other | Admitting: *Deleted

## 2011-10-30 ENCOUNTER — Ambulatory Visit: Payer: Medicare Other | Admitting: Internal Medicine

## 2011-10-30 DIAGNOSIS — I82409 Acute embolism and thrombosis of unspecified deep veins of unspecified lower extremity: Secondary | ICD-10-CM

## 2011-10-30 DIAGNOSIS — Z7901 Long term (current) use of anticoagulants: Secondary | ICD-10-CM

## 2011-10-30 DIAGNOSIS — I2699 Other pulmonary embolism without acute cor pulmonale: Secondary | ICD-10-CM

## 2011-10-30 LAB — POCT INR: INR: 3.6

## 2011-10-30 MED ORDER — WARFARIN SODIUM 6 MG PO TABS: ORAL_TABLET | ORAL | Status: DC

## 2011-10-31 ENCOUNTER — Encounter: Payer: Medicare Other | Admitting: *Deleted

## 2011-11-13 ENCOUNTER — Other Ambulatory Visit: Payer: Self-pay | Admitting: Internal Medicine

## 2011-11-20 ENCOUNTER — Ambulatory Visit (INDEPENDENT_AMBULATORY_CARE_PROVIDER_SITE_OTHER): Payer: Medicare Other | Admitting: *Deleted

## 2011-11-20 DIAGNOSIS — I2699 Other pulmonary embolism without acute cor pulmonale: Secondary | ICD-10-CM

## 2011-11-20 DIAGNOSIS — I82409 Acute embolism and thrombosis of unspecified deep veins of unspecified lower extremity: Secondary | ICD-10-CM

## 2011-11-20 DIAGNOSIS — Z7901 Long term (current) use of anticoagulants: Secondary | ICD-10-CM

## 2011-11-28 ENCOUNTER — Telehealth: Payer: Self-pay

## 2011-11-28 ENCOUNTER — Ambulatory Visit (INDEPENDENT_AMBULATORY_CARE_PROVIDER_SITE_OTHER): Payer: Medicare Other

## 2011-11-28 DIAGNOSIS — D649 Anemia, unspecified: Secondary | ICD-10-CM

## 2011-11-28 MED ORDER — CYANOCOBALAMIN 1000 MCG/ML IJ SOLN
1000.0000 ug | Freq: Once | INTRAMUSCULAR | Status: AC
  Administered 2011-11-28: 1000 ug via INTRAMUSCULAR

## 2011-11-28 NOTE — Telephone Encounter (Signed)
Message copied by Pincus Sanes on Fri Nov 28, 2011 12:02 PM ------      Message from: Corwin Levins      Created: Fri Nov 28, 2011 12:00 PM       I would try to stay with the current coumadin clinic for now, since they have not been able to get the new one started her yet      ----- Message -----         From: Scharlene Gloss         Sent: 11/28/2011  10:05 AM           To: Oliver Barre, MD            The patient would like to know if she could start coming here to get her PT checked. They are charged $40 each time at the coumadin clinic and due to her husbands health problems they cannot continue to do.

## 2011-11-28 NOTE — Telephone Encounter (Signed)
Patient informed. 

## 2011-12-18 ENCOUNTER — Encounter: Payer: Medicare Other | Admitting: *Deleted

## 2011-12-22 ENCOUNTER — Ambulatory Visit (INDEPENDENT_AMBULATORY_CARE_PROVIDER_SITE_OTHER): Payer: Medicare Other | Admitting: *Deleted

## 2011-12-22 ENCOUNTER — Other Ambulatory Visit: Payer: Self-pay | Admitting: *Deleted

## 2011-12-22 DIAGNOSIS — D649 Anemia, unspecified: Secondary | ICD-10-CM

## 2011-12-22 MED ORDER — CYANOCOBALAMIN 1000 MCG/ML IJ SOLN
1000.0000 ug | Freq: Once | INTRAMUSCULAR | Status: AC
  Administered 2011-12-22: 1000 ug via INTRAMUSCULAR

## 2011-12-22 MED ORDER — LORAZEPAM 1 MG PO TABS: 1.0000 mg | ORAL_TABLET | Freq: Two times a day (BID) | ORAL | Status: DC | PRN

## 2011-12-22 NOTE — Telephone Encounter (Signed)
Pt is requesting refill of Lorazepam 1 mg bid to go to [facility] on [address] advise

## 2011-12-22 NOTE — Telephone Encounter (Signed)
Faxed hardcopy to [facility]

## 2011-12-22 NOTE — Telephone Encounter (Signed)
Done hardcopy to robin  

## 2011-12-30 ENCOUNTER — Ambulatory Visit: Payer: Medicare Other

## 2011-12-30 ENCOUNTER — Ambulatory Visit (INDEPENDENT_AMBULATORY_CARE_PROVIDER_SITE_OTHER): Payer: Medicare Other | Admitting: *Deleted

## 2011-12-30 DIAGNOSIS — Z7901 Long term (current) use of anticoagulants: Secondary | ICD-10-CM

## 2011-12-30 DIAGNOSIS — I2699 Other pulmonary embolism without acute cor pulmonale: Secondary | ICD-10-CM

## 2011-12-30 DIAGNOSIS — I82409 Acute embolism and thrombosis of unspecified deep veins of unspecified lower extremity: Secondary | ICD-10-CM

## 2011-12-30 LAB — POCT INR: INR: 2.7

## 2011-12-31 ENCOUNTER — Ambulatory Visit: Payer: Medicare Other

## 2012-01-21 ENCOUNTER — Ambulatory Visit: Payer: Medicare Other

## 2012-01-23 ENCOUNTER — Telehealth: Payer: Self-pay

## 2012-01-23 ENCOUNTER — Other Ambulatory Visit: Payer: Self-pay

## 2012-01-23 MED ORDER — LEVOTHYROXINE SODIUM 50 MCG PO TABS: 25.0000 ug | ORAL_TABLET | Freq: Every day | ORAL | Status: DC

## 2012-01-23 MED ORDER — LEVOTHYROXINE SODIUM 25 MCG PO TABS: 25.0000 ug | ORAL_TABLET | Freq: Every day | ORAL | Status: DC

## 2012-01-23 NOTE — Telephone Encounter (Signed)
Pharmacy received new prescription for Synthroid 50 mcg to take 1/2 daily. Please clarify as she could get .

## 2012-01-23 NOTE — Telephone Encounter (Signed)
rx re-done per pharmacy request

## 2012-01-29 ENCOUNTER — Other Ambulatory Visit: Payer: Self-pay | Admitting: Cardiology

## 2012-02-17 ENCOUNTER — Other Ambulatory Visit: Payer: Self-pay

## 2012-02-17 ENCOUNTER — Other Ambulatory Visit: Payer: Self-pay | Admitting: Internal Medicine

## 2012-02-17 MED ORDER — METOPROLOL TARTRATE 50 MG PO TABS: 50.0000 mg | ORAL_TABLET | Freq: Two times a day (BID) | ORAL | Status: DC

## 2012-02-17 MED ORDER — LORAZEPAM 1 MG PO TABS: 1.0000 mg | ORAL_TABLET | Freq: Two times a day (BID) | ORAL | Status: DC | PRN

## 2012-02-17 NOTE — Telephone Encounter (Signed)
Ok for early refill this time only;  I reviewed Briana Hartman's chart as well and he has been prescribed ativan same rx in the past as well and is now recently post cabg  Please remind pt that "sharing" of medication of a controlled substance nature cannot be condoned or supported, and doing so in the future may result in d/c from the practice, as the laws against this are strict, and any MD supporting this can have medical license ended  Briana Hartman (or Briana Hartman) should let us know if he needs his own prescription refilled

## 2012-02-17 NOTE — Telephone Encounter (Signed)
Patient is requesting refill on her Lorazepam. States she is asking early but has been giving her medication to her husband Briana Hartman), please advise on refill

## 2012-02-17 NOTE — Telephone Encounter (Signed)
Faxed hardcopy to pharmacy and informed the patient as well of MD's instructions.

## 2012-02-24 ENCOUNTER — Ambulatory Visit (INDEPENDENT_AMBULATORY_CARE_PROVIDER_SITE_OTHER): Payer: Medicare Other

## 2012-02-24 DIAGNOSIS — I2699 Other pulmonary embolism without acute cor pulmonale: Secondary | ICD-10-CM

## 2012-02-24 DIAGNOSIS — Z7901 Long term (current) use of anticoagulants: Secondary | ICD-10-CM

## 2012-02-24 DIAGNOSIS — I82409 Acute embolism and thrombosis of unspecified deep veins of unspecified lower extremity: Secondary | ICD-10-CM

## 2012-03-01 ENCOUNTER — Other Ambulatory Visit: Payer: Self-pay

## 2012-03-01 MED ORDER — TEMAZEPAM 30 MG PO CAPS: 30.0000 mg | ORAL_CAPSULE | Freq: Every evening | ORAL | Status: DC | PRN

## 2012-03-01 NOTE — Telephone Encounter (Signed)
Done hardcopy to robin  

## 2012-03-01 NOTE — Telephone Encounter (Signed)
Faxed hardcopy to pharmacy. 

## 2012-03-12 ENCOUNTER — Other Ambulatory Visit: Payer: Self-pay

## 2012-03-12 MED ORDER — AMLODIPINE BESYLATE 5 MG PO TABS: 5.0000 mg | ORAL_TABLET | Freq: Every day | ORAL | Status: DC

## 2012-03-16 ENCOUNTER — Ambulatory Visit (INDEPENDENT_AMBULATORY_CARE_PROVIDER_SITE_OTHER): Payer: Medicare Other

## 2012-03-16 DIAGNOSIS — E538 Deficiency of other specified B group vitamins: Secondary | ICD-10-CM

## 2012-03-16 MED ORDER — CYANOCOBALAMIN 1000 MCG/ML IJ SOLN
1000.0000 ug | Freq: Once | INTRAMUSCULAR | Status: AC
  Administered 2012-03-16: 1000 ug via INTRAMUSCULAR

## 2012-03-17 ENCOUNTER — Ambulatory Visit: Payer: Medicare Other | Admitting: Internal Medicine

## 2012-04-12 ENCOUNTER — Ambulatory Visit (INDEPENDENT_AMBULATORY_CARE_PROVIDER_SITE_OTHER): Payer: Medicare Other

## 2012-04-12 DIAGNOSIS — Z7901 Long term (current) use of anticoagulants: Secondary | ICD-10-CM

## 2012-04-12 DIAGNOSIS — I82409 Acute embolism and thrombosis of unspecified deep veins of unspecified lower extremity: Secondary | ICD-10-CM

## 2012-04-12 DIAGNOSIS — I2699 Other pulmonary embolism without acute cor pulmonale: Secondary | ICD-10-CM

## 2012-04-21 ENCOUNTER — Other Ambulatory Visit: Payer: Self-pay | Admitting: Cardiology

## 2012-05-12 ENCOUNTER — Other Ambulatory Visit: Payer: Self-pay

## 2012-05-12 MED ORDER — LORAZEPAM 1 MG PO TABS: 1.0000 mg | ORAL_TABLET | Freq: Two times a day (BID) | ORAL | Status: DC | PRN

## 2012-05-13 NOTE — Telephone Encounter (Signed)
Rx faxed to pharmacy  

## 2012-05-26 ENCOUNTER — Other Ambulatory Visit: Payer: Self-pay

## 2012-05-26 MED ORDER — TEMAZEPAM 30 MG PO CAPS: 30.0000 mg | ORAL_CAPSULE | Freq: Every evening | ORAL | Status: DC | PRN

## 2012-05-26 NOTE — Telephone Encounter (Signed)
Done hardcopy to robin  

## 2012-05-27 ENCOUNTER — Ambulatory Visit (INDEPENDENT_AMBULATORY_CARE_PROVIDER_SITE_OTHER): Payer: Medicare Other | Admitting: Pharmacist

## 2012-05-27 DIAGNOSIS — I82409 Acute embolism and thrombosis of unspecified deep veins of unspecified lower extremity: Secondary | ICD-10-CM

## 2012-05-27 DIAGNOSIS — Z7901 Long term (current) use of anticoagulants: Secondary | ICD-10-CM

## 2012-05-27 DIAGNOSIS — I2699 Other pulmonary embolism without acute cor pulmonale: Secondary | ICD-10-CM

## 2012-05-27 LAB — POCT INR: INR: 2.6

## 2012-05-27 NOTE — Telephone Encounter (Signed)
Faxed hardcopy to pharmacy. 

## 2012-06-15 ENCOUNTER — Ambulatory Visit (INDEPENDENT_AMBULATORY_CARE_PROVIDER_SITE_OTHER): Payer: Medicare Other | Admitting: *Deleted

## 2012-06-15 DIAGNOSIS — E538 Deficiency of other specified B group vitamins: Secondary | ICD-10-CM

## 2012-06-15 MED ORDER — CYANOCOBALAMIN 1000 MCG/ML IJ SOLN
1000.0000 ug | Freq: Once | INTRAMUSCULAR | Status: AC
  Administered 2012-06-15: 1000 ug via INTRAMUSCULAR

## 2012-07-15 ENCOUNTER — Ambulatory Visit (INDEPENDENT_AMBULATORY_CARE_PROVIDER_SITE_OTHER): Payer: Medicare Other | Admitting: *Deleted

## 2012-07-15 ENCOUNTER — Ambulatory Visit: Payer: Medicare Other

## 2012-07-15 DIAGNOSIS — I82409 Acute embolism and thrombosis of unspecified deep veins of unspecified lower extremity: Secondary | ICD-10-CM

## 2012-07-15 DIAGNOSIS — I2699 Other pulmonary embolism without acute cor pulmonale: Secondary | ICD-10-CM

## 2012-07-15 DIAGNOSIS — Z7901 Long term (current) use of anticoagulants: Secondary | ICD-10-CM

## 2012-07-20 ENCOUNTER — Ambulatory Visit: Payer: Medicare Other

## 2012-08-09 ENCOUNTER — Telehealth: Payer: Self-pay

## 2012-08-09 NOTE — Telephone Encounter (Signed)
Sorry, this is not the standard of care at this time, and I cannot offer that kind of check as an option  We will a coumadin clinic soon in September, we hope;  Until then, all I can do is recommend to continue the same treatment and followup testing

## 2012-08-09 NOTE — Telephone Encounter (Signed)
The patient is having financial problems due to cost of medical bills for her husband and has difficulty coming once a month to check coumadin.  She has heard of a monitor that you can check coumadin with at home and call and report the results to MD.  Please advise

## 2012-08-09 NOTE — Telephone Encounter (Signed)
Patient informed. 

## 2012-08-09 NOTE — Telephone Encounter (Signed)
Pt called specifically requesting a return call from [place], [place]

## 2012-08-19 ENCOUNTER — Other Ambulatory Visit: Payer: Self-pay | Admitting: *Deleted

## 2012-08-19 MED ORDER — LORAZEPAM 1 MG PO TABS: 1.0000 mg | ORAL_TABLET | Freq: Two times a day (BID) | ORAL | Status: DC | PRN

## 2012-08-19 NOTE — Telephone Encounter (Signed)
Faxed script back to [facility]...Raechel Chute

## 2012-08-19 NOTE — Telephone Encounter (Signed)
Done hardcopy to robin   Needs rov

## 2012-08-24 ENCOUNTER — Other Ambulatory Visit: Payer: Self-pay

## 2012-08-24 MED ORDER — TEMAZEPAM 30 MG PO CAPS: 30.0000 mg | ORAL_CAPSULE | Freq: Every evening | ORAL | Status: DC | PRN

## 2012-08-24 NOTE — Telephone Encounter (Signed)
Faxed hardcopy to pharmacy. 

## 2012-08-24 NOTE — Telephone Encounter (Signed)
Done hardcopy to robin  

## 2012-08-27 ENCOUNTER — Ambulatory Visit: Payer: Medicare Other | Admitting: Cardiology

## 2012-08-30 ENCOUNTER — Ambulatory Visit (INDEPENDENT_AMBULATORY_CARE_PROVIDER_SITE_OTHER): Payer: Medicare Other | Admitting: *Deleted

## 2012-08-30 ENCOUNTER — Ambulatory Visit (INDEPENDENT_AMBULATORY_CARE_PROVIDER_SITE_OTHER): Payer: Medicare Other | Admitting: Cardiology

## 2012-08-30 ENCOUNTER — Encounter: Payer: Self-pay | Admitting: Cardiology

## 2012-08-30 VITALS — BP 126/80 | HR 67 | Ht 68.0 in | Wt 174.0 lb

## 2012-08-30 DIAGNOSIS — Z86711 Personal history of pulmonary embolism: Secondary | ICD-10-CM

## 2012-08-30 DIAGNOSIS — Z7901 Long term (current) use of anticoagulants: Secondary | ICD-10-CM

## 2012-08-30 DIAGNOSIS — I82409 Acute embolism and thrombosis of unspecified deep veins of unspecified lower extremity: Secondary | ICD-10-CM

## 2012-08-30 DIAGNOSIS — G479 Sleep disorder, unspecified: Secondary | ICD-10-CM

## 2012-08-30 DIAGNOSIS — I2699 Other pulmonary embolism without acute cor pulmonale: Secondary | ICD-10-CM

## 2012-08-30 DIAGNOSIS — I10 Essential (primary) hypertension: Secondary | ICD-10-CM

## 2012-08-30 DIAGNOSIS — I2782 Chronic pulmonary embolism: Secondary | ICD-10-CM

## 2012-08-30 LAB — BASIC METABOLIC PANEL
BUN: 16 mg/dL (ref 6–23)
Calcium: 9.1 mg/dL (ref 8.4–10.5)
GFR: 72 mL/min (ref >60.00)
Glucose, Bld: 91 mg/dL (ref 70–99)
Sodium: 139 mEq/L (ref 135–145)

## 2012-08-30 LAB — CBC WITH DIFFERENTIAL/PLATELET
Basophils Absolute: 0.1 10*3/uL (ref 0.0–0.1)
Eosinophils Relative: 1.2 % (ref 0.0–5.0)
Lymphocytes Relative: 36.1 % (ref 12.0–46.0)
Monocytes Relative: 6.2 % (ref 3.0–12.0)
Neutrophils Relative %: 55.9 % (ref 43.0–77.0)
Platelets: 266 10*3/uL (ref 150.0–400.0)
RDW: 14 % (ref 11.5–14.6)
WBC: 10.5 10*3/uL (ref 4.5–10.5)

## 2012-08-30 NOTE — Assessment & Plan Note (Signed)
The patient snores at night, she does not sleep very well. She gets up and down frequently. We previously suspected some sleep apnea, but we will go ahead and get a sleep study to evaluate this.

## 2012-08-30 NOTE — Progress Notes (Signed)
HPI:  The patient is in for followup. Overall she is doing well. Her major limitation is that she doesn't sleep very well at night she gets up frequently. We previously suggested that she have a sleep study, but this had to be delayed as she had not seen her primary care physician. She still has not had that done. In the urine her husband said bypass surgery the patient itself is done extremely well without major limitations  Current Outpatient Prescriptions  Medication Sig Dispense Refill  . amLODipine (NORVASC) 5 MG tablet Take 1 tablet (5 mg total) by mouth daily.  30 tablet  11  . BAYER CONTOUR TEST test strip as directed.      Marland Kitchen BAYER MICROLET LANCETS lancets as directed.      . Cyanocobalamin (VITAMIN B-12 IJ) Inject as directed every 30 (thirty) days.        . furosemide (LASIX) 40 MG tablet Take 1 tablet (40 mg total) by mouth 2 (two) times daily.  60 tablet  11  . levothyroxine (LEVOTHROID) 25 MCG tablet Take 1 tablet (25 mcg total) by mouth daily.  90 tablet  3  . LORazepam (ATIVAN) 1 MG tablet Take 1 tablet (1 mg total) by mouth 2 (two) times daily as needed for anxiety.  60 tablet  0  . losartan (COZAAR) 100 MG tablet TAKE ONE TABLET BY MOUTH ONE TIME DAILY  30 tablet  11  . metoprolol (LOPRESSOR) 50 MG tablet Take 1 tablet (50 mg total) by mouth 2 (two) times daily.  60 tablet  6  . nitroGLYCERIN (NITROSTAT) 0.4 MG SL tablet Place 0.4 mg under the tongue every 5 (five) minutes as needed.        Marland Kitchen omeprazole (PRILOSEC) 20 MG capsule Take 20 mg by mouth daily. As needed      . temazepam (RESTORIL) 30 MG capsule Take 1 capsule (30 mg total) by mouth at bedtime as needed.  30 capsule  0  . warfarin (COUMADIN) 6 MG tablet TAKE AS DIRECTED BY ANTICOAGULATION CLINIC.  40 tablet  3  . DISCONTD: losartan (COZAAR) 25 MG tablet Take 25 mg by mouth daily.          Allergies  Allergen Reactions  . Ampicillin   . Aspirin   . Caffeine   . Codeine   . Penicillins     Past Medical  History  Diagnosis Date  . Adenomatous colon polyp   . DVT 04/25/2009  . GASTRIC POLYP, HX OF 04/25/2009  . Heart palpitations 04/24/2011  . Irritable bowel syndrome 05/11/2009  . MITRAL VALVE PROLAPSE 04/25/2009  . PULMONARY EMBOLISM 04/25/2009  . VENTRAL HERNIA 04/25/2009  . Phlebitis   . Allergy   . Arthritis   . Skin cancer   . COPD (chronic obstructive pulmonary disease)   . Diabetes mellitus   . ACID REFLUX DISEASE 04/25/2009  . Hyperlipidemia   . HYPERTENSION 04/25/2009  . HYPOTHYROIDISM 04/25/2009  . Lumbar spinal stenosis   . Peripheral neuropathy   . Anxiety   . Asthma   . Anemia   . Hearing loss, right 09/16/2011  . Long term current use of anticoagulant 09/16/2011  . Cervical radiculopathy 09/16/2011    Past Surgical History  Procedure Date  . Appendectomy   . Cesarean section   . Cataract surgery   . Cholecystectomy   . Varicose vein surgery   . Abdominal hysterectomy   . Colonoscopy 05/24/2004    interal hemorrhoids, diverticulosis  . Upper  gastrointestinal endoscopy 12/31/2007    gastric polyp    Family History  Problem Relation Age of Onset  . Heart disease Mother   . Clotting disorder Mother   . Breast cancer Sister   . Diabetes Sister     x 3  . Heart disease Sister   . Diabetes Brother     x 3    History   Social History  . Marital Status: Married    Spouse Name: N/A    Number of Children: 3  . Years of Education: N/A   Occupational History  . baker at [employer]     Social History Main Topics  . Smoking status: Never Smoker   . Smokeless tobacco: Not on file  . Alcohol Use: No  . Drug Use: No  . Sexually Active: Not on file   Other Topics Concern  . Not on file   Social History Narrative  . No narrative on file    ROS: Please see the HPI.  All other systems reviewed and negative.  PHYSICAL EXAM:  BP 126/80  Pulse 67  Ht 5\' 8"  (1.727 m)  Wt 174 lb (78.926 kg)  BMI 26.46 kg/m2  General: Well developed, well nourished, in no acute  distress. Head:  Normocephalic and atraumatic. Neck: no JVD Lungs: Clear to auscultation and percussion. Heart: Normal S1 and S2.  No murmur, rubs or gallops.  Pulses: Pulses normal in all 4 extremities. Extremities: No clubbing or cyanosis. No edema. Neurologic: Alert and oriented x 3.  EKG:   NSR.  Minor nonspecific T flattening.    ASSESSMENT AND PLAN:

## 2012-08-30 NOTE — Assessment & Plan Note (Signed)
The patient is stable from a cardiac standpoint. We will go ahead and get labs as she is on chronic anticoagulation therapy.

## 2012-08-30 NOTE — Patient Instructions (Addendum)
Your physician recommends that you continue on your current medications as directed. Please refer to the Current Medication list given to you today.  Your physician wants you to follow-up in: 6 MONTHS WITH DR. Riley Kill You will receive a reminder letter in the mail two months in advance. If you don't receive a letter, please call our office to schedule the follow-up appointment.   Your physician recommends that you HAVE lab work TODAY; CBC, BMET  Your physician has recommended that you have a sleep study. This test records several body functions during sleep, including: brain activity, eye movement, oxygen and carbon dioxide blood levels, heart rate and rhythm, breathing rate and rhythm, the flow of air through your mouth and nose, snoring, body muscle movements, and chest and belly movement.  AT [facility] LONG FOR SLEEP DISTURBANCES

## 2012-08-30 NOTE — Assessment & Plan Note (Signed)
Patient is a remote history of pulmonary embolism. She's been on long-term Coumadin anticoagulation. She's remained stable.

## 2012-08-30 NOTE — Assessment & Plan Note (Signed)
The patient's blood pressure appears to be well-controlled at the present time on medical regimen.

## 2012-09-01 ENCOUNTER — Ambulatory Visit (INDEPENDENT_AMBULATORY_CARE_PROVIDER_SITE_OTHER): Payer: Medicare Other | Admitting: *Deleted

## 2012-09-01 DIAGNOSIS — E538 Deficiency of other specified B group vitamins: Secondary | ICD-10-CM

## 2012-09-01 DIAGNOSIS — Z23 Encounter for immunization: Secondary | ICD-10-CM

## 2012-09-01 MED ORDER — CYANOCOBALAMIN 1000 MCG/ML IJ SOLN
1000.0000 ug | Freq: Once | INTRAMUSCULAR | Status: AC
  Administered 2012-09-01: 1000 ug via INTRAMUSCULAR

## 2012-09-15 ENCOUNTER — Other Ambulatory Visit: Payer: Self-pay

## 2012-09-15 MED ORDER — LORAZEPAM 1 MG PO TABS: 1.0000 mg | ORAL_TABLET | Freq: Two times a day (BID) | ORAL | Status: DC | PRN

## 2012-09-15 NOTE — Telephone Encounter (Signed)
Done hardcopy to robin  

## 2012-09-16 NOTE — Telephone Encounter (Signed)
Faxed hardcopy to pharmacy. 

## 2012-09-20 ENCOUNTER — Other Ambulatory Visit: Payer: Self-pay | Admitting: *Deleted

## 2012-09-20 MED ORDER — WARFARIN SODIUM 6 MG PO TABS: 6.0000 mg | ORAL_TABLET | ORAL | Status: DC

## 2012-09-22 ENCOUNTER — Encounter (HOSPITAL_BASED_OUTPATIENT_CLINIC_OR_DEPARTMENT_OTHER): Payer: Medicare Other

## 2012-09-23 ENCOUNTER — Encounter: Payer: Medicare Other | Admitting: Internal Medicine

## 2012-09-24 ENCOUNTER — Other Ambulatory Visit: Payer: Self-pay | Admitting: Internal Medicine

## 2012-09-24 ENCOUNTER — Other Ambulatory Visit: Payer: Self-pay

## 2012-09-24 MED ORDER — TEMAZEPAM 30 MG PO CAPS: 30.0000 mg | ORAL_CAPSULE | Freq: Every evening | ORAL | Status: DC | PRN

## 2012-09-24 NOTE — Telephone Encounter (Signed)
Done hardcopy to robin  Due for OV

## 2012-09-24 NOTE — Telephone Encounter (Signed)
Faxed hardcopy to pharmacy. 

## 2012-10-04 ENCOUNTER — Ambulatory Visit (INDEPENDENT_AMBULATORY_CARE_PROVIDER_SITE_OTHER): Payer: Medicare Other | Admitting: General Practice

## 2012-10-04 DIAGNOSIS — Z7901 Long term (current) use of anticoagulants: Secondary | ICD-10-CM

## 2012-10-04 DIAGNOSIS — I2699 Other pulmonary embolism without acute cor pulmonale: Secondary | ICD-10-CM

## 2012-10-04 DIAGNOSIS — I82409 Acute embolism and thrombosis of unspecified deep veins of unspecified lower extremity: Secondary | ICD-10-CM

## 2012-10-04 LAB — POCT INR: INR: 2.9

## 2012-10-14 ENCOUNTER — Other Ambulatory Visit: Payer: Self-pay

## 2012-10-14 ENCOUNTER — Ambulatory Visit (HOSPITAL_BASED_OUTPATIENT_CLINIC_OR_DEPARTMENT_OTHER): Payer: Medicare Other | Attending: Cardiology | Admitting: Radiology

## 2012-10-14 VITALS — Ht 68.0 in | Wt 176.0 lb

## 2012-10-14 DIAGNOSIS — G479 Sleep disorder, unspecified: Secondary | ICD-10-CM

## 2012-10-14 DIAGNOSIS — I2782 Chronic pulmonary embolism: Secondary | ICD-10-CM | POA: Insufficient documentation

## 2012-10-14 MED ORDER — LORAZEPAM 1 MG PO TABS: 1.0000 mg | ORAL_TABLET | Freq: Two times a day (BID) | ORAL | Status: DC | PRN

## 2012-10-14 NOTE — Telephone Encounter (Signed)
Done hardcopy to robin  Zella Ball to remind pt - due for OV for further refills, last seen here sept 2012

## 2012-10-15 NOTE — Telephone Encounter (Signed)
Faxed hardcopy to pharmacy and called the patient to remind to schedule OV.  She stated she has already scheduled appt. In December 2013.

## 2012-10-26 DIAGNOSIS — G4710 Hypersomnia, unspecified: Secondary | ICD-10-CM

## 2012-10-26 DIAGNOSIS — G4730 Sleep apnea, unspecified: Secondary | ICD-10-CM

## 2012-10-26 NOTE — Procedures (Cosign Needed)
NAMEALZINA, Briana Hartman           ACCOUNT NO.:  [id number]  MEDICAL RECORD NO.:  [id number]          PATIENT TYPE:  OUT  LOCATION:  SLEEP CENTER                 FACILITY[facility]  [facility]  PHYSICIAN:  Barbaraann Share, MD,FCCPDATE OF BIRTH:  July 03[birth year] 1936  DATE OF STUDY:  10/14/2012                           NOCTURNAL POLYSOMNOGRAM  REFERRING PHYSICIAN:  Arturo Morton. Riley Kill, MD, [facility]  REFERRING PHYSICIAN:  Arturo Morton. Riley Kill, MD, [facility]  INDICATION FOR STUDY:  Hypersomnia with sleep apnea.  EPWORTH SLEEPINESS SCORE:  Zero.  SLEEP ARCHITECTURE:  The patient had a total sleep time of 286 minutes with no slow-wave sleep and decreased quantity of REM.  Sleep onset latency was normal at 27 minutes and REM onset was normal at 98 minutes. Sleep efficiency was moderately reduced at 77%.  RESPIRATORY DATA:  The patient was found to have 16 apneas and 19 obstructive hypopneas, giving her an apnea/hypopnea index of 7 events per hour.  The events occurred in all body positions that were primarily associated with REM.  Moderate snoring was noted throughout.  OXYGEN DATA:  The patient had oxygen desaturation as low as 82% with her obstructive events, but was very transient.  CARDIAC DATA:  Occasional PVC noted, but no clinically significant arrhythmias were seen.  MOVEMENT/PARASOMNIA:  The patient was found to have 158 limb movements but only 0.4 per hour resulted in arousal or awakening.  There were no abnormal behaviors seen.  IMPRESSION/RECOMMENDATION: 1. Very mild obstructive sleep apnea/hypopnea syndrome with an AHI of     7 events per hour and oxygen desaturation as low as 82%.  Treatment     for this degree of sleep apnea can include a trial of weight loss     alone, upper airway surgery, dental appliance, and also CPAP.     Clinical correlation is suggested. 2. Occasional premature ventricular contraction noted, but no     clinically significant arrhythmias were seen. 3. Large numbers of  periodic limb movements, however, very little     sleep disruption was noted.  It is unclear if this is contributing     to the patient's symptomatology, and again clinical correlation is     suggested.     Barbaraann Share, MD,FCCP Diplomate, American Board of Sleep Medicine    [facility]/MEDQ  D:  10/26/2012 08:25:07  T:  10/26/2012 11:02:43  Job:  [id number]

## 2012-10-27 ENCOUNTER — Other Ambulatory Visit: Payer: Self-pay | Admitting: Internal Medicine

## 2012-10-29 ENCOUNTER — Telehealth: Payer: Self-pay | Admitting: Internal Medicine

## 2012-10-29 MED ORDER — TEMAZEPAM 30 MG PO CAPS: 30.0000 mg | ORAL_CAPSULE | Freq: Every evening | ORAL | Status: DC | PRN

## 2012-10-29 NOTE — Telephone Encounter (Signed)
Pt req refill for Temazepam 30mg  to be call into [facility]. Please call pt once this is done or with any question.

## 2012-10-29 NOTE — Telephone Encounter (Signed)
Ok this time  Please remind pt, pt is due for rov, no further refills without OV

## 2012-10-29 NOTE — Telephone Encounter (Signed)
Faxed hardcopy to pharmacy, patient informed and does have appt.scheduled for 12/13

## 2012-11-04 ENCOUNTER — Telehealth: Payer: Self-pay | Admitting: Cardiology

## 2012-11-04 NOTE — Telephone Encounter (Signed)
New problem:   Sleep study test results.

## 2012-11-04 NOTE — Telephone Encounter (Signed)
I spoke with the pt and made her aware of sleep study results.  Study shows mild OSA. I did make the pt aware that weight loss was recommended for treatment of mild OSA.  I will forward this information to Dr Riley Kill to review sleep study (results are located in your sleep study folder in inbasket).

## 2012-11-09 ENCOUNTER — Other Ambulatory Visit: Payer: Self-pay

## 2012-11-09 MED ORDER — LORAZEPAM 1 MG PO TABS: 1.0000 mg | ORAL_TABLET | Freq: Two times a day (BID) | ORAL | Status: DC | PRN

## 2012-11-09 NOTE — Telephone Encounter (Signed)
Done hardcopy to robin  [name] to notify pt - needs OV for further refills

## 2012-11-10 NOTE — Telephone Encounter (Signed)
Patient has appointment on 11/30/12.  Faxed hardcopy to pharmacy

## 2012-11-17 ENCOUNTER — Ambulatory Visit (INDEPENDENT_AMBULATORY_CARE_PROVIDER_SITE_OTHER): Payer: Medicare Other | Admitting: General Practice

## 2012-11-17 DIAGNOSIS — I82409 Acute embolism and thrombosis of unspecified deep veins of unspecified lower extremity: Secondary | ICD-10-CM

## 2012-11-17 DIAGNOSIS — Z7901 Long term (current) use of anticoagulants: Secondary | ICD-10-CM

## 2012-11-17 DIAGNOSIS — I2699 Other pulmonary embolism without acute cor pulmonale: Secondary | ICD-10-CM

## 2012-11-30 ENCOUNTER — Ambulatory Visit (INDEPENDENT_AMBULATORY_CARE_PROVIDER_SITE_OTHER): Payer: Medicare Other | Admitting: Internal Medicine

## 2012-11-30 ENCOUNTER — Other Ambulatory Visit: Payer: Self-pay | Admitting: Internal Medicine

## 2012-11-30 ENCOUNTER — Encounter: Payer: Self-pay | Admitting: Internal Medicine

## 2012-11-30 ENCOUNTER — Other Ambulatory Visit (INDEPENDENT_AMBULATORY_CARE_PROVIDER_SITE_OTHER): Payer: Medicare Other

## 2012-11-30 VITALS — BP 120/72 | HR 56 | Temp 97.2°F | Ht 68.0 in | Wt 173.1 lb

## 2012-11-30 DIAGNOSIS — E538 Deficiency of other specified B group vitamins: Secondary | ICD-10-CM

## 2012-11-30 DIAGNOSIS — Z23 Encounter for immunization: Secondary | ICD-10-CM

## 2012-11-30 DIAGNOSIS — G4700 Insomnia, unspecified: Secondary | ICD-10-CM | POA: Insufficient documentation

## 2012-11-30 DIAGNOSIS — IMO0001 Reserved for inherently not codable concepts without codable children: Secondary | ICD-10-CM

## 2012-11-30 DIAGNOSIS — Z0000 Encounter for general adult medical examination without abnormal findings: Secondary | ICD-10-CM

## 2012-11-30 DIAGNOSIS — H9191 Unspecified hearing loss, right ear: Secondary | ICD-10-CM

## 2012-11-30 DIAGNOSIS — F419 Anxiety disorder, unspecified: Secondary | ICD-10-CM

## 2012-11-30 LAB — BASIC METABOLIC PANEL
BUN: 17 mg/dL (ref 6–23)
Creatinine, Ser: 0.8 mg/dL (ref 0.4–1.2)
GFR: 77.37 mL/min (ref >60.00)
Potassium: 3.9 mEq/L (ref 3.5–5.1)

## 2012-11-30 LAB — LDL CHOLESTEROL, DIRECT: Direct LDL: 141.1 mg/dL

## 2012-11-30 LAB — CBC WITH DIFFERENTIAL/PLATELET
Basophils Relative: 0.7 % (ref 0.0–3.0)
Eosinophils Relative: 1.3 % (ref 0.0–5.0)
HCT: 43.8 % (ref 36.0–46.0)
Hemoglobin: 14.3 g/dL (ref 12.0–15.0)
Lymphs Abs: 3.9 10*3/uL (ref 0.7–4.0)
MCV: 89.4 fl (ref 78.0–100.0)
Monocytes Absolute: 0.7 10*3/uL (ref 0.1–1.0)
RBC: 4.9 Mil/uL (ref 3.87–5.11)
WBC: 10.1 10*3/uL (ref 4.5–10.5)

## 2012-11-30 LAB — URINALYSIS, ROUTINE W REFLEX MICROSCOPIC
Nitrite: POSITIVE
Total Protein, Urine: NEGATIVE

## 2012-11-30 LAB — LIPID PANEL
Cholesterol: 203 mg/dL — ABNORMAL HIGH (ref 0–200)
Total CHOL/HDL Ratio: 6

## 2012-11-30 LAB — MICROALBUMIN / CREATININE URINE RATIO
Creatinine,U: 145.2 mg/dL
Microalb, Ur: 2.6 mg/dL — ABNORMAL HIGH (ref 0.0–1.9)

## 2012-11-30 LAB — HEPATIC FUNCTION PANEL: Total Bilirubin: 0.5 mg/dL (ref 0.3–1.2)

## 2012-11-30 MED ORDER — METOPROLOL TARTRATE 50 MG PO TABS: 50.0000 mg | ORAL_TABLET | Freq: Two times a day (BID) | ORAL | Status: DC

## 2012-11-30 MED ORDER — ATORVASTATIN CALCIUM 10 MG PO TABS: 10.0000 mg | ORAL_TABLET | Freq: Every day | ORAL | Status: DC

## 2012-11-30 MED ORDER — CITALOPRAM HYDROBROMIDE 10 MG PO TABS: 10.0000 mg | ORAL_TABLET | Freq: Every day | ORAL | Status: DC

## 2012-11-30 MED ORDER — CYANOCOBALAMIN 1000 MCG/ML IJ SOLN
1000.0000 ug | Freq: Once | INTRAMUSCULAR | Status: AC
  Administered 2012-11-30: 1000 ug via INTRAMUSCULAR

## 2012-11-30 MED ORDER — TRAZODONE HCL 50 MG PO TABS: 25.0000 mg | ORAL_TABLET | Freq: Every evening | ORAL | Status: DC | PRN

## 2012-11-30 MED ORDER — LORAZEPAM 1 MG PO TABS: 1.0000 mg | ORAL_TABLET | Freq: Two times a day (BID) | ORAL | Status: DC | PRN

## 2012-11-30 NOTE — Assessment & Plan Note (Signed)
Ok to change the temazepam to trazodone due to cost of copoay per pt request

## 2012-11-30 NOTE — Assessment & Plan Note (Signed)
Ok to add citalopram 10 qd, Continue all other medications as before,declines counseling or psychiatric referral

## 2012-11-30 NOTE — Progress Notes (Signed)
Subjective:    Patient ID: Briana Hartman, female    DOB: [birth year], 78 y.o.   MRN: [id number]  HPI  Here for wellness and f/u;  Overall doing ok;  Pt denies CP, worsening SOB, DOE, wheezing, orthopnea, PND, worsening LE edema, palpitations, dizziness or syncope.  Pt denies neurological change such as new Headache, facial or extremity weakness.  Pt denies polydipsia, polyuria, or low sugar symptoms. Pt states overall good compliance with treatment and medications, good tolerability, and trying to follow lower cholesterol diet.  Pt denies worsening depressive symptoms, suicidal ideation or panic. No fever, wt loss, night sweats, loss of appetite, or other constitutional symptoms.  Pt states good ability with ADL's, low fall risk, home safety reviewed and adequate, no significant changes in hearing or vision, and occasionally active with exercise.  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  Does also have chronic neurpathic pain to the legs, but doesnot want further meds for this Past Medical History  Diagnosis Date  . Adenomatous colon polyp   . DVT 04/25/2009  . GASTRIC POLYP, HX OF 04/25/2009  . Heart palpitations 04/24/2011  . Irritable bowel syndrome 05/11/2009  . MITRAL VALVE PROLAPSE 04/25/2009  . PULMONARY EMBOLISM 04/25/2009  . VENTRAL HERNIA 04/25/2009  . Phlebitis   . Allergy   . Arthritis   . Skin cancer   . COPD (chronic obstructive pulmonary disease)   . Diabetes mellitus   . ACID REFLUX DISEASE 04/25/2009  . Hyperlipidemia   . HYPERTENSION 04/25/2009  . HYPOTHYROIDISM 04/25/2009  . Lumbar spinal stenosis   . Peripheral neuropathy   . Anxiety   . Asthma   . Anemia   . Hearing loss, right 09/16/2011  . Long term current use of anticoagulant 09/16/2011  . Cervical radiculopathy 09/16/2011  . Insomnia  11/30/2012   Past Surgical History  Procedure Date  . Appendectomy   . Cesarean section   . Cataract surgery   . Cholecystectomy   . Varicose vein surgery   . Abdominal hysterectomy   . Colonoscopy 05/24/2004    interal hemorrhoids, diverticulosis  . Upper gastrointestinal endoscopy 12/31/2007    gastric polyp    reports that she has never smoked. She does not have any smokeless tobacco history on file. She reports that she does not drink alcohol or use illicit drugs. family history includes Breast cancer in her sister; Clotting disorder in her mother; Diabetes in her brother and sister; and Heart disease in her mother and sister. Allergies  Allergen Reactions  . Ampicillin   . Aspirin   . Caffeine   . Codeine   . Penicillins    Current Outpatient Prescriptions on File Prior to Visit  Medication Sig Dispense Refill  . BAYER CONTOUR TEST test strip as directed.      Marland Kitchen BAYER MICROLET LANCETS lancets as directed.      . Cyanocobalamin (VITAMIN B-12 IJ) Inject as directed every 30 (thirty) days.        Marland Kitchen levothyroxine (LEVOTHROID) 25 MCG tablet Take 1 tablet (25 mcg total) by mouth daily.  90 tablet  3  . losartan (COZAAR) 100 MG tablet TAKE ONE TABLET BY MOUTH ONE TIME DAILY  30 tablet  11  . nitroGLYCERIN (NITROSTAT) 0.4 MG SL tablet Place 0.4 mg under the tongue every 5 (  five) minutes as needed.        Marland Kitchen omeprazole (PRILOSEC) 20 MG capsule Take 20 mg by mouth daily. As needed      . warfarin (COUMADIN) 6 MG tablet Take 1 tablet (6 mg total) by mouth as directed.  40 tablet  3  . [DISCONTINUED] metoprolol (LOPRESSOR) 50 MG tablet TAKE ONE TABLET BY MOUTH TWICE DAILY  60 tablet  0  . amLODipine (NORVASC) 5 MG tablet Take 1 tablet (5 mg total) by mouth daily.  30 tablet  11  . citalopram (CELEXA) 10 MG tablet Take 1 tablet (10 mg total) by mouth daily.  90 tablet  3  . furosemide (LASIX) 40 MG tablet Take 1 tablet (40 mg total) by mouth 2 (two) times daily.  60 tablet  11  . traZODone  (DESYREL) 50 MG tablet Take 0.5-1 tablets (25-50 mg total) by mouth at bedtime as needed for sleep.  90 tablet  1   Review of Systems Review of Systems  Constitutional: Negative for diaphoresis, activity change, appetite change and unexpected weight change.  HENT: Negative for hearing loss, ear pain, facial swelling, mouth sores and neck stiffness.   Eyes: Negative for pain, redness and visual disturbance.  Respiratory: Negative for shortness of breath and wheezing.   Cardiovascular: Negative for chest pain and palpitations.  Gastrointestinal: Negative for diarrhea, blood in stool, abdominal distention and rectal pain.  Genitourinary: Negative for hematuria, flank pain and decreased urine volume.  Musculoskeletal: Negative for myalgias and joint swelling.  Skin: Negative for color change and wound.  Neurological: Negative for syncope and numbness.  Hematological: Negative for adenopathy.  Psychiatric/Behavioral: Negative for hallucinations, self-injury, decreased concentration and agitation.      Objective:   Physical Exam BP 120/72  Pulse 56  Temp 97.2 F (36.2 C) (Oral)  Ht 5\' 8"  (1.727 m)  Wt 173 lb 2 oz (78.529 kg)  BMI 26.32 kg/m2  SpO2 95% Physical Exam  VS noted Constitutional: Pt is oriented to person, place, and time. Appears well-developed and well-nourished.  Head: Normocephalic and atraumatic.  Right Ear: External ear normal.  Left Ear: External ear normal.  Nose: Nose normal.  Mouth/Throat: Oropharynx is clear and moist.  Bilat tm's mild erythema.  Sinus nontender.  Pharynx mild erythema Eyes: Conjunctivae and EOM are normal. Pupils are equal, round, and reactive to light.  Neck: Normal range of motion. Neck supple. No JVD present. No tracheal deviation present.  Cardiovascular: Normal rate, regular rhythm, normal heart sounds and intact distal pulses.   Pulmonary/Chest: Effort normal and breath sounds normal.  Abdominal: Soft. Bowel sounds are normal. There is no  tenderness.  Musculoskeletal: Normal range of motion. Exhibits no edema.  Lymphadenopathy:  Has no cervical adenopathy.  Neurological: Pt is alert and oriented to person, place, and time. Pt has normal reflexes. No cranial nerve deficit. Motor/dtr intact Skin: Skin is warm and dry. No rash noted.  Psychiatric:  Has  normal mood and affect. Behavior is normal. except 1-2+ nervous    Assessment & Plan:

## 2012-11-30 NOTE — Assessment & Plan Note (Signed)

## 2012-11-30 NOTE — Assessment & Plan Note (Signed)
Acute on chronic, for right wax irrigation, also for allegra/mucinex for bilat ear eustachian valve symptoms as well

## 2012-11-30 NOTE — Patient Instructions (Addendum)
Your right ear was irrigated today of wax OK to change the temazepam to trazodone for sleep You lorazepam and metoprolol were refilled today as well You should try mucinex and allegra OTC for the ear congestion and fullness Continue all other medications as before Please have the pharmacy call with any other refills you may need. You had the tetanus and B12 shots today Take all new medications as prescribed - the citalopram 10 mg for nerves Please remember to followup with the yearly  mammogram Please remember to sign up for My Chart at your earliest convenience, as this will be important to you in the future with finding out test results. You are otherwise up to date with prevention Please return in 6 mo with Lab testing done 3-5 days before

## 2012-12-07 ENCOUNTER — Telehealth: Payer: Self-pay

## 2012-12-07 ENCOUNTER — Other Ambulatory Visit: Payer: Self-pay | Admitting: Internal Medicine

## 2012-12-07 NOTE — Telephone Encounter (Signed)
It's not clear to me that this was due to the lipitor, but I did list this on her allergy list  OK to stay off all statin med for now, try to follow lower chol diet, might consider a different statin in future

## 2012-12-07 NOTE — Telephone Encounter (Signed)
The patient started on Friday.  30 to 40 mins after taking she had blurred vision, nausea, numbness in lips and face/neck.  She took another one on Saturday and the same symptoms returned.  She has not had one since Saturday, she has had some diarrhea and nausea since the weekend. She has completely stopped none since Saturday.  Advise please

## 2012-12-07 NOTE — Telephone Encounter (Signed)
plaese ask for detail on symptoms or was there a rash?

## 2012-12-07 NOTE — Telephone Encounter (Signed)
The patient called informing she has had a reaction to Lipitor.  Called her back no answer left message to call back.  Please advise

## 2012-12-08 NOTE — Telephone Encounter (Signed)
Called the patient informed of MD instructions. 

## 2012-12-11 ENCOUNTER — Encounter (HOSPITAL_COMMUNITY): Payer: Self-pay

## 2012-12-11 ENCOUNTER — Emergency Department (HOSPITAL_COMMUNITY): Payer: Medicare Other

## 2012-12-11 ENCOUNTER — Emergency Department (HOSPITAL_COMMUNITY)
Admission: EM | Admit: 2012-12-11 | Discharge: 2012-12-11 | Disposition: A | Discharge status: Home / Self Care | Payer: Medicare Other | Attending: Emergency Medicine | Admitting: Emergency Medicine

## 2012-12-11 DIAGNOSIS — E039 Hypothyroidism, unspecified: Secondary | ICD-10-CM | POA: Insufficient documentation

## 2012-12-11 DIAGNOSIS — S51019A Laceration without foreign body of unspecified elbow, initial encounter: Secondary | ICD-10-CM

## 2012-12-11 DIAGNOSIS — H9190 Unspecified hearing loss, unspecified ear: Secondary | ICD-10-CM | POA: Insufficient documentation

## 2012-12-11 DIAGNOSIS — E785 Hyperlipidemia, unspecified: Secondary | ICD-10-CM | POA: Insufficient documentation

## 2012-12-11 DIAGNOSIS — Z86711 Personal history of pulmonary embolism: Secondary | ICD-10-CM | POA: Insufficient documentation

## 2012-12-11 DIAGNOSIS — Z8679 Personal history of other diseases of the circulatory system: Secondary | ICD-10-CM | POA: Insufficient documentation

## 2012-12-11 DIAGNOSIS — J45909 Unspecified asthma, uncomplicated: Secondary | ICD-10-CM | POA: Insufficient documentation

## 2012-12-11 DIAGNOSIS — F411 Generalized anxiety disorder: Secondary | ICD-10-CM | POA: Insufficient documentation

## 2012-12-11 DIAGNOSIS — S0990XA Unspecified injury of head, initial encounter: Secondary | ICD-10-CM

## 2012-12-11 DIAGNOSIS — E119 Type 2 diabetes mellitus without complications: Secondary | ICD-10-CM | POA: Insufficient documentation

## 2012-12-11 DIAGNOSIS — I10 Essential (primary) hypertension: Secondary | ICD-10-CM | POA: Insufficient documentation

## 2012-12-11 DIAGNOSIS — Z79899 Other long term (current) drug therapy: Secondary | ICD-10-CM | POA: Insufficient documentation

## 2012-12-11 DIAGNOSIS — S0100XA Unspecified open wound of scalp, initial encounter: Secondary | ICD-10-CM | POA: Insufficient documentation

## 2012-12-11 DIAGNOSIS — Z8669 Personal history of other diseases of the nervous system and sense organs: Secondary | ICD-10-CM | POA: Insufficient documentation

## 2012-12-11 DIAGNOSIS — R51 Headache: Secondary | ICD-10-CM | POA: Insufficient documentation

## 2012-12-11 DIAGNOSIS — Z860100 Personal history of colon polyps, unspecified: Secondary | ICD-10-CM | POA: Insufficient documentation

## 2012-12-11 DIAGNOSIS — Z8719 Personal history of other diseases of the digestive system: Secondary | ICD-10-CM | POA: Insufficient documentation

## 2012-12-11 DIAGNOSIS — Y929 Unspecified place or not applicable: Secondary | ICD-10-CM | POA: Insufficient documentation

## 2012-12-11 DIAGNOSIS — Z86718 Personal history of other venous thrombosis and embolism: Secondary | ICD-10-CM | POA: Insufficient documentation

## 2012-12-11 DIAGNOSIS — Z7901 Long term (current) use of anticoagulants: Secondary | ICD-10-CM | POA: Insufficient documentation

## 2012-12-11 DIAGNOSIS — D649 Anemia, unspecified: Secondary | ICD-10-CM | POA: Insufficient documentation

## 2012-12-11 DIAGNOSIS — K219 Gastro-esophageal reflux disease without esophagitis: Secondary | ICD-10-CM | POA: Insufficient documentation

## 2012-12-11 DIAGNOSIS — Z85828 Personal history of other malignant neoplasm of skin: Secondary | ICD-10-CM | POA: Insufficient documentation

## 2012-12-11 DIAGNOSIS — W108XXA Fall (on) (from) other stairs and steps, initial encounter: Secondary | ICD-10-CM | POA: Insufficient documentation

## 2012-12-11 DIAGNOSIS — S51009A Unspecified open wound of unspecified elbow, initial encounter: Secondary | ICD-10-CM | POA: Insufficient documentation

## 2012-12-11 DIAGNOSIS — S0101XA Laceration without foreign body of scalp, initial encounter: Secondary | ICD-10-CM

## 2012-12-11 DIAGNOSIS — Z8739 Personal history of other diseases of the musculoskeletal system and connective tissue: Secondary | ICD-10-CM | POA: Insufficient documentation

## 2012-12-11 DIAGNOSIS — S0190XA Unspecified open wound of unspecified part of head, initial encounter: Secondary | ICD-10-CM | POA: Insufficient documentation

## 2012-12-11 DIAGNOSIS — Y939 Activity, unspecified: Secondary | ICD-10-CM | POA: Insufficient documentation

## 2012-12-11 LAB — COMPREHENSIVE METABOLIC PANEL
ALT: 23 U/L (ref 0–35)
Alkaline Phosphatase: 58 U/L (ref 39–117)
CO2: 24 mEq/L (ref 19–32)
GFR calc Af Amer: >90 mL/min (ref >90)
Glucose, Bld: 116 mg/dL — ABNORMAL HIGH (ref 70–99)
Potassium: 3.8 mEq/L (ref 3.5–5.1)
Sodium: 138 mEq/L (ref 135–145)
Total Protein: 6.7 g/dL (ref 6.0–8.3)

## 2012-12-11 LAB — CBC WITH DIFFERENTIAL/PLATELET
Lymphocytes Relative: 28 % (ref 12–46)
Lymphs Abs: 3.2 10*3/uL (ref 0.7–4.0)
Neutro Abs: 7.3 10*3/uL (ref 1.7–7.7)
Neutrophils Relative %: 65 % (ref 43–77)
Platelets: 262 10*3/uL (ref 150–400)
RBC: 4.7 MIL/uL (ref 3.87–5.11)
WBC: 11.3 10*3/uL — ABNORMAL HIGH (ref 4.0–10.5)

## 2012-12-11 MED ORDER — LIDOCAINE HCL (PF) 1 % IJ SOLN
30.0000 mL | Freq: Once | INTRAMUSCULAR | Status: AC
  Administered 2012-12-11: 30 mL
  Filled 2012-12-11: qty 30

## 2012-12-11 NOTE — ED Notes (Signed)
EMS-pt lost balance falling backwards and striking the back of head (right side) and right eyebrow (mild laceration noted), small avulsion to right elbow. No LOC. Pt placed on LSB and on CCollar. Vitals stable.

## 2012-12-11 NOTE — ED Provider Notes (Addendum)
History     CSN: [id number]  Arrival date & time 12/11/12  1730   First MD Initiated Contact with Patient 12/11/12 1739      Chief Complaint  Patient presents with  . Fall  . Head Laceration  . Elbow Pain    (Consider location/radiation/quality/duration/timing/severity/associated sxs/prior treatment) HPI Pt fell backward down several steps striking head. No LOC. No focal weakness or numbness. Pt was in normal states of health and lost balance. Denies CP, SOB. +headache and R elbow pain.  Past Medical History  Diagnosis Date  . Adenomatous colon polyp   . DVT 04/25/2009  . GASTRIC POLYP, HX OF 04/25/2009  . Heart palpitations 04/24/2011  . Irritable bowel syndrome 05/11/2009  . MITRAL VALVE PROLAPSE 04/25/2009  . PULMONARY EMBOLISM 04/25/2009  . VENTRAL HERNIA 04/25/2009  . Phlebitis   . Allergy   . Arthritis   . Skin cancer   . COPD (chronic obstructive pulmonary disease)   . Diabetes mellitus   . ACID REFLUX DISEASE 04/25/2009  . Hyperlipidemia   . HYPERTENSION 04/25/2009  . HYPOTHYROIDISM 04/25/2009  . Lumbar spinal stenosis   . Peripheral neuropathy   . Anxiety   . Asthma   . Anemia   . Hearing loss, right 09/16/2011  . Long term current use of anticoagulant 09/16/2011  . Cervical radiculopathy 09/16/2011  . Insomnia 11/30/2012    Past Surgical History  Procedure Date  . Appendectomy   . Cesarean section   . Cataract surgery   . Cholecystectomy   . Varicose vein surgery   . Abdominal hysterectomy   . Colonoscopy 05/24/2004    interal hemorrhoids, diverticulosis  . Upper gastrointestinal endoscopy 12/31/2007    gastric polyp    Family History  Problem Relation Age of Onset  . Heart disease Mother   . Clotting disorder Mother   . Breast cancer Sister   . Diabetes Sister     x 3  . Heart disease Sister   . Diabetes Brother     x 3    History  Substance Use Topics  . Smoking status: Never Smoker   . Smokeless tobacco: Not on file  . Alcohol Use: No    OB  History    Grav Para Term Preterm Abortions TAB SAB Ect Mult Living                  Review of Systems  Constitutional: Negative for fever and chills.  HENT: Negative for neck pain.   Eyes: Negative for visual disturbance.  Respiratory: Negative for shortness of breath.   Cardiovascular: Negative for chest pain.  Gastrointestinal: Negative for nausea, vomiting and abdominal pain.  Musculoskeletal: Negative for myalgias and back pain.  Skin: Positive for wound. Negative for rash.  Neurological: Positive for headaches. Negative for dizziness, seizures, syncope, weakness, light-headedness and numbness.  All other systems reviewed and are negative.    Allergies  Ampicillin; Aspirin; Caffeine; Codeine; Lipitor; and Penicillins  Home Medications   Current Outpatient Rx  Name  Route  Sig  Dispense  Refill  . AMLODIPINE BESYLATE 5 MG PO TABS   Oral   Take 1 tablet (5 mg total) by mouth daily.   30 tablet   11   . CITALOPRAM HYDROBROMIDE 10 MG PO TABS   Oral   Take 1 tablet (10 mg total) by mouth daily.   90 tablet   3   . FUROSEMIDE 40 MG PO TABS      TAKE ONE TABLET  BY MOUTH TWICE DAILY   60 tablet   11   . LEVOTHYROXINE SODIUM 25 MCG PO TABS   Oral   Take 1 tablet (25 mcg total) by mouth daily.   90 tablet   3   . LORAZEPAM 1 MG PO TABS   Oral   Take 1 tablet (1 mg total) by mouth 2 (two) times daily as needed for anxiety.   60 tablet   5   . LOSARTAN POTASSIUM 100 MG PO TABS      TAKE ONE TABLET BY MOUTH ONE TIME DAILY   30 tablet   11   . METOPROLOL TARTRATE 50 MG PO TABS   Oral   Take 1 tablet (50 mg total) by mouth 2 (two) times daily.   180 tablet   3   . NITROGLYCERIN 0.4 MG SL SUBL   Sublingual   Place 0.4 mg under the tongue every 5 (five) minutes as needed.           Marland Kitchen OMEPRAZOLE 20 MG PO CPDR   Oral   Take 20 mg by mouth daily. As needed         . WARFARIN SODIUM 6 MG PO TABS   Oral   Take 1 tablet (6 mg total) by mouth as  directed.   40 tablet   3   . BAYER CONTOUR TEST VI STRP      as directed.         Marland Kitchen BAYER MICROLET LANCETS MISC      as directed.         Marland Kitchen VITAMIN B-12 IJ   Injection   Inject as directed every 30 (thirty) days.             BP 154/73  Pulse 78  Temp 99.3 F (37.4 C) (Oral)  Resp 15  SpO2 95%  Physical Exam  Nursing note and vitals reviewed. Constitutional: She is oriented to person, place, and time. She appears well-developed and well-nourished. No distress.  HENT:  Head: Normocephalic.  Mouth/Throat: Oropharynx is clear and moist.       Dried blood R parietal scalp without active bleeding  Eyes: EOM are normal. Pupils are equal, round, and reactive to light.  Neck:       c-collar in place  Cardiovascular: Normal rate and regular rhythm.   Pulmonary/Chest: Effort normal and breath sounds normal. No respiratory distress. She has no wheezes. She has no rales.  Abdominal: Soft. Bowel sounds are normal. There is no tenderness. There is no rebound and no guarding.  Musculoskeletal: Normal range of motion. She exhibits tenderness (R elbow over olecrannon . Decreased ROOm due to pain). She exhibits no edema.  Neurological: She is alert and oriented to person, place, and time.       5/5 motor in all ext, sensation intact  Skin: Skin is warm and dry. No rash noted. No erythema.       2-3 cm to R elbow. No active bleeding  Psychiatric: She has a normal mood and affect. Her behavior is normal.    ED Course  LACERATION REPAIR Date/Time: 12/11/2012 7:52 AM Performed by: Loren Racer Authorized by: Ranae Palms, Chalee Hirota Consent: Verbal consent obtained. Body area: head/neck Location details: scalp Laceration length: 2 cm Irrigation solution: saline Amount of cleaning: standard Debridement: none Degree of undermining: none Skin closure: staples Approximation: close Approximation difficulty: simple Patient tolerance: Patient tolerated the procedure well with no  immediate complications.  LACERATION REPAIR Date/Time: 12/11/2012 7:53  AM Performed by: Loren Racer Authorized by: Loren Racer Consent: Verbal consent obtained. Body area: upper extremity Location details: right elbow Laceration length: 3 cm Foreign bodies: no foreign bodies Tendon involvement: none Nerve involvement: none Vascular damage: no Local anesthetic: lidocaine 1% with epinephrine Irrigation solution: saline Amount of cleaning: standard Skin closure: 5-0 nylon Technique: simple Approximation: close Approximation difficulty: simple Dressing: gauze roll Patient tolerance: Patient tolerated the procedure well with no immediate complications.   (including critical care time)  Labs Reviewed  CBC WITH DIFFERENTIAL - Abnormal; Notable for the following:    WBC 11.3 (*)     All other components within normal limits  COMPREHENSIVE METABOLIC PANEL - Abnormal; Notable for the following:    Glucose, Bld 116 (*)     Total Bilirubin 0.2 (*)     GFR calc non Af Amer 80 (*)     All other components within normal limits  PROTIME-INR - Abnormal; Notable for the following:    Prothrombin Time 21.7 (*)     INR 1.98 (*)     All other components within normal limits  APTT  LAB REPORT - SCANNED   Dg Elbow Complete Right  12/11/2012  *RADIOLOGY REPORT*  Clinical Data: Fall, elbow pain  RIGHT ELBOW - COMPLETE 3+ VIEW  Comparison: None.  Findings: Four views of the right elbow submitted.  No acute fracture or subluxation.  No posterior fat pad sign.  There is spurring of the humeral epicondyles.  IMPRESSION: No acute fracture or subluxation.   Original Report Authenticated By: Natasha Mead, M.D.    Ct Head Wo Contrast  12/11/2012  *RADIOLOGY REPORT*  Clinical Data:  Larey Seat on stairs striking back of head, on Coumadin  CT HEAD WITHOUT CONTRAST CT CERVICAL SPINE WITHOUT CONTRAST  Technique:  Multidetector CT imaging of the head and cervical spine was performed following the standard  protocol without intravenous contrast.  Multiplanar CT image reconstructions of the cervical spine were also generated.  Comparison:  None  CT HEAD  Findings: Normal ventricular morphology. No midline shift or mass effect. Dural calcifications within falx. No intracranial hemorrhage, mass lesion or evidence of acute infarction. No extra-axial fluid collections. Visualized paranasal sinuses and mastoid air cells clear. Atherosclerotic calcification within internal carotid and vertebral arteries at skull base. Hyperostosis frontalis interna. No acute osseous findings.  IMPRESSION: No acute intracranial abnormalities.  CT CERVICAL SPINE  Findings: Visualized skull base intact. Atherosclerotic calcifications throughout the carotid systems. Prevertebral soft tissues normal thickness. Disc space narrowing at C4-C5 through C6-C7. Bulky anterior C5-C6 and posterior C6-C7 endplate spurs are identified. AP narrowing of spinal canal at C6-C7 due primarily to endplate spur formation, slightly greater left of midline. Scattered facet degenerative changes. No acute fracture, subluxation or bone destruction. Lung apices clear. Asymmetric soft tissue at the right lateral aspect of the pharynx question 13 mm nodule/mass.  IMPRESSION: Degenerative disc disease changes of the cervical spine greatest at C5-C6 and C6-C7. No acute cervical spine abnormalities. Question soft tissue mass/nodule at right lateral aspect of the pharynx 13 mm diameter, recommend direct visualization to exclude tumor.  Findings called to Dr. Ranae Palms on 12/11/2012 at 1918 hrs.   Original Report Authenticated By: Ulyses Southward, M.D.    Ct Cervical Spine Wo Contrast  12/11/2012  *RADIOLOGY REPORT*  Clinical Data:  Larey Seat on stairs striking back of head, on Coumadin  CT HEAD WITHOUT CONTRAST CT CERVICAL SPINE WITHOUT CONTRAST  Technique:  Multidetector CT imaging of the head and cervical spine was  performed following the standard protocol without intravenous  contrast.  Multiplanar CT image reconstructions of the cervical spine were also generated.  Comparison:  None  CT HEAD  Findings: Normal ventricular morphology. No midline shift or mass effect. Dural calcifications within falx. No intracranial hemorrhage, mass lesion or evidence of acute infarction. No extra-axial fluid collections. Visualized paranasal sinuses and mastoid air cells clear. Atherosclerotic calcification within internal carotid and vertebral arteries at skull base. Hyperostosis frontalis interna. No acute osseous findings.  IMPRESSION: No acute intracranial abnormalities.  CT CERVICAL SPINE  Findings: Visualized skull base intact. Atherosclerotic calcifications throughout the carotid systems. Prevertebral soft tissues normal thickness. Disc space narrowing at C4-C5 through C6-C7. Bulky anterior C5-C6 and posterior C6-C7 endplate spurs are identified. AP narrowing of spinal canal at C6-C7 due primarily to endplate spur formation, slightly greater left of midline. Scattered facet degenerative changes. No acute fracture, subluxation or bone destruction. Lung apices clear. Asymmetric soft tissue at the right lateral aspect of the pharynx question 13 mm nodule/mass.  IMPRESSION: Degenerative disc disease changes of the cervical spine greatest at C5-C6 and C6-C7. No acute cervical spine abnormalities. Question soft tissue mass/nodule at right lateral aspect of the pharynx 13 mm diameter, recommend direct visualization to exclude tumor.  Findings called to Dr. Ranae Palms on 12/11/2012 at 1918 hrs.   Original Report Authenticated By: Ulyses Southward, M.D.      1. Closed head injury   2. Scalp laceration   3. Elbow laceration       MDM   Unable to visualize OP well. Informed of CT result and need to f/u with ENT for findings.        Loren Racer, MD 12/12/12 1610  Loren Racer, MD 01/07/13 [phone]

## 2012-12-14 ENCOUNTER — Telehealth: Payer: Self-pay | Admitting: Internal Medicine

## 2012-12-14 DIAGNOSIS — J392 Other diseases of pharynx: Secondary | ICD-10-CM

## 2012-12-14 NOTE — Telephone Encounter (Signed)
Patient informed. 

## 2012-12-14 NOTE — Telephone Encounter (Signed)
Briana Hartman needs a referral to [facility].  She fell and has staples in her head and elbow.  She had a CT at [facility] that showed a mass in her throat.  She was advised to go to an ENT. She has an appt Friday Dec.27 with Dr Felicity Coyer for staple removal.

## 2012-12-14 NOTE — Telephone Encounter (Signed)
referall done per emr

## 2012-12-17 ENCOUNTER — Encounter: Payer: Self-pay | Admitting: Internal Medicine

## 2012-12-17 ENCOUNTER — Ambulatory Visit (INDEPENDENT_AMBULATORY_CARE_PROVIDER_SITE_OTHER): Payer: Medicare Other | Admitting: Internal Medicine

## 2012-12-17 ENCOUNTER — Ambulatory Visit (INDEPENDENT_AMBULATORY_CARE_PROVIDER_SITE_OTHER): Payer: Medicare Other | Admitting: General Practice

## 2012-12-17 ENCOUNTER — Other Ambulatory Visit: Payer: Self-pay | Admitting: General Practice

## 2012-12-17 VITALS — BP 132/72 | HR 64 | Temp 98.0°F | Ht 68.0 in | Wt 174.0 lb

## 2012-12-17 DIAGNOSIS — S0101XA Laceration without foreign body of scalp, initial encounter: Secondary | ICD-10-CM

## 2012-12-17 DIAGNOSIS — I2699 Other pulmonary embolism without acute cor pulmonale: Secondary | ICD-10-CM

## 2012-12-17 DIAGNOSIS — I82409 Acute embolism and thrombosis of unspecified deep veins of unspecified lower extremity: Secondary | ICD-10-CM

## 2012-12-17 DIAGNOSIS — S51009A Unspecified open wound of unspecified elbow, initial encounter: Secondary | ICD-10-CM

## 2012-12-17 DIAGNOSIS — S51019A Laceration without foreign body of unspecified elbow, initial encounter: Secondary | ICD-10-CM

## 2012-12-17 DIAGNOSIS — J392 Other diseases of pharynx: Secondary | ICD-10-CM

## 2012-12-17 DIAGNOSIS — S0100XA Unspecified open wound of scalp, initial encounter: Secondary | ICD-10-CM

## 2012-12-17 DIAGNOSIS — Z7901 Long term (current) use of anticoagulants: Secondary | ICD-10-CM

## 2012-12-17 MED ORDER — WARFARIN SODIUM 6 MG PO TABS: 6.0000 mg | ORAL_TABLET | ORAL | Status: DC

## 2012-12-17 NOTE — Patient Instructions (Addendum)
It was good to see you today. We have reviewed your prior records including labs and tests today We have removed the staples and sutures today - protime check today as discussed continue wound care of your elbow as reviewed - wash, neosporin, then cover daily until healed follow up with ENT on Friday as scheduled Staple Removal Care After The staples used to close your skin have been removed. The wound needs continued care so it can heal completely and without problems. The care described here will need to be done for another 5-10 days unless your caregiver advises otherwise.   HOME CARE INSTRUCTIONS    Keep wound site dry and clean.   If skin adhesive strips were applied after the staples were removed, they will begin to peel off in a few days. If they remain after fourteen days, they may be peeled off and discarded.   If you still have a dressing, change it at least once a day or as instructed by your caregiver. If the bandage sticks, soak it off with warm water. Pat dry with a clean towel. Look for signs of infection (see below).   Reapply cream or ointment according to your caregiver's instruction. This will help prevent infection and keep the bandage from sticking. Use of a non-stick material over the wound and under the dressing or wrap will also help keep the bandage from sticking.   If the bandage becomes wet, dirty or develops a foul smell, change it as soon as possible.   New scars become sunburned easily. Use sunscreens with protection factor (SPF) of at least 15 when out in the sun.   Only take over-the-counter or prescription medicines for pain, discomfort or fever as directed by your caregiver.  SEEK IMMEDIATE MEDICAL CARE IF:    There is redness, swelling or increasing pain in the wound.   Pus is coming from the wound.   An unexplained oral temperature above 102 F (38.9 C) develops.   You notice a foul smell coming from the wound or dressing.   There is a breaking  open of the suture line (edges not staying together) of the wound edges after staples have been removed.  Document Released: 11/20/2008 Document Revised: 03/01/2012 Document Reviewed: 11/20/2008 [facility] Patient Information 2013 [place], [place].

## 2012-12-17 NOTE — Progress Notes (Signed)
Subjective:    Patient ID: Briana Hartman, female    DOB: October 25, 1934, 78 y.o.   MRN: [id number]  HPI  Here for ER follow up and suture removal - accidental fall 12/21 -back down few stairs - laceration to R parietal scalp - R elbow pain but no fracture identified on xray - CT head ok, CT c-spine with incidental ?pharyngeal mass - refer to ENT made 12/14/12  Past Medical History  Diagnosis Date  . Adenomatous colon polyp   . Irritable bowel syndrome   . MITRAL VALVE PROLAPSE   . 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 VENTRAL HERNIA   . Arthritis   . Skin cancer   . COPD (chronic obstructive pulmonary disease)   . Diabetes mellitus   . ACID REFLUX DISEASE   . Hyperlipidemia   . HYPERTENSION   . HYPOTHYROIDISM   . Lumbar spinal stenosis   . Peripheral neuropathy   . Anxiety   . Asthma   . Anemia   . Long term current use of anticoagulant   . Cervical radiculopathy    Review of Systems  Constitutional: Negative for fever and fatigue.  Musculoskeletal: Positive for myalgias. Negative for joint swelling.  Neurological: Negative for dizziness, weakness and headaches.       Objective:   Physical Exam BP 132/72  Pulse 64  Temp 98 F (36.7 C) (Oral)  Ht 5\' 8"  (1.727 m)  Wt 174 lb (78.926 kg)  BMI 26.46 kg/m2  SpO2 95% Wt Readings from Last 3 Encounters:  12/17/12 174 lb (78.926 kg)  11/30/12 173 lb 2 oz (78.529 kg)  10/14/12 176 lb (79.833 kg)   Constitutional: She appears well-developed and well-nourished. No distress.  HENT: Head: Normocephalic and atraumatic. Ears: B TMs ok, no erythema or effusion; Nose: Nose normal. Mouth/Throat: Oropharynx is clear and moist. No oropharyngeal exudate.  Eyes: Conjunctivae and EOM are normal. Pupils are equal, round, and reactive to light. No scleral icterus.  Neck: Normal range of motion. Neck supple. No JVD present. No thyromegaly present.  Cardiovascular: Normal rate, regular rhythm and normal heart  sounds.  No murmur heard. No BLE edema. Pulmonary/Chest: Effort normal and breath sounds normal. No respiratory distress. She has no wheezes.  Neurological: She is alert and oriented to person, place, and time. No cranial nerve deficit. Coordination normal.  Skin: R parietal scalp laceration well approx - 3 staples intact, removed without complication - R elbow laceration over olecronon - 5 sutures removed without complication - remaining skin is warm and dry. No rash noted. No erythema.  Psychiatric: She has a normal mood and affect. Her behavior is normal. Judgment and thought content normal.   Lab Results  Component Value Date   WBC 11.3* 12/11/2012   HGB 14.0 12/11/2012   HCT 41.7 12/11/2012   PLT 262 12/11/2012   GLUCOSE 116* 12/11/2012   CHOL 203* 11/30/2012   TRIG 188.0* 11/30/2012   HDL 34.30* 11/30/2012   LDLDIRECT 141.1 11/30/2012   ALT 23 12/11/2012   AST 29 12/11/2012   NA 138 12/11/2012   K 3.8 12/11/2012   CL 101 12/11/2012   CREATININE 0.77 12/11/2012   BUN 13 12/11/2012   CO2 24 12/11/2012   TSH 3.04 11/30/2012   INR 1.98* 12/11/2012   HGBA1C 6.3 11/30/2012   MICROALBUR 2.6* 11/30/2012   Dg Elbow Complete Right  12/11/2012  *RADIOLOGY REPORT*  Clinical Data: Fall, elbow pain  RIGHT ELBOW - COMPLETE 3+ VIEW  Comparison: None.  Findings: Four views of the right elbow submitted.  No acute fracture or subluxation.  No posterior fat pad sign.  There is spurring of the humeral epicondyles.  IMPRESSION: No acute fracture or subluxation.   Original Report Authenticated By: Natasha Mead, M.D.    Ct Head Wo Contrast  12/11/2012  *RADIOLOGY REPORT*  Clinical Data:  Larey Seat on stairs striking back of head, on Coumadin  CT HEAD WITHOUT CONTRAST CT CERVICAL SPINE WITHOUT CONTRAST  Technique:  Multidetector CT imaging of the head and cervical spine was performed following the standard protocol without intravenous contrast.  Multiplanar CT image reconstructions of the cervical spine  were also generated.  Comparison:  None  CT HEAD  Findings: Normal ventricular morphology. No midline shift or mass effect. Dural calcifications within falx. No intracranial hemorrhage, mass lesion or evidence of acute infarction. No extra-axial fluid collections. Visualized paranasal sinuses and mastoid air cells clear. Atherosclerotic calcification within internal carotid and vertebral arteries at skull base. Hyperostosis frontalis interna. No acute osseous findings.  IMPRESSION: No acute intracranial abnormalities.  CT CERVICAL SPINE  Findings: Visualized skull base intact. Atherosclerotic calcifications throughout the carotid systems. Prevertebral soft tissues normal thickness. Disc space narrowing at C4-C5 through C6-C7. Bulky anterior C5-C6 and posterior C6-C7 endplate spurs are identified. AP narrowing of spinal canal at C6-C7 due primarily to endplate spur formation, slightly greater left of midline. Scattered facet degenerative changes. No acute fracture, subluxation or bone destruction. Lung apices clear. Asymmetric soft tissue at the right lateral aspect of the pharynx question 13 mm nodule/mass.  IMPRESSION: Degenerative disc disease changes of the cervical spine greatest at C5-C6 and C6-C7. No acute cervical spine abnormalities. Question soft tissue mass/nodule at right lateral aspect of the pharynx 13 mm diameter, recommend direct visualization to exclude tumor.  Findings called to Dr. Ranae Palms on 12/11/2012 at 1918 hrs.   Original Report Authenticated By: Ulyses Southward, M.D.    Ct Cervical Spine Wo Contrast  12/11/2012  *RADIOLOGY REPORT*  Clinical Data:  Larey Seat on stairs striking back of head, on Coumadin  CT HEAD WITHOUT CONTRAST CT CERVICAL SPINE WITHOUT CONTRAST  Technique:  Multidetector CT imaging of the head and cervical spine was performed following the standard protocol without intravenous contrast.  Multiplanar CT image reconstructions of the cervical spine were also generated.  Comparison:   None  CT HEAD  Findings: Normal ventricular morphology. No midline shift or mass effect. Dural calcifications within falx. No intracranial hemorrhage, mass lesion or evidence of acute infarction. No extra-axial fluid collections. Visualized paranasal sinuses and mastoid air cells clear. Atherosclerotic calcification within internal carotid and vertebral arteries at skull base. Hyperostosis frontalis interna. No acute osseous findings.  IMPRESSION: No acute intracranial abnormalities.  CT CERVICAL SPINE  Findings: Visualized skull base intact. Atherosclerotic calcifications throughout the carotid systems. Prevertebral soft tissues normal thickness. Disc space narrowing at C4-C5 through C6-C7. Bulky anterior C5-C6 and posterior C6-C7 endplate spurs are identified. AP narrowing of spinal canal at C6-C7 due primarily to endplate spur formation, slightly greater left of midline. Scattered facet degenerative changes. No acute fracture, subluxation or bone destruction. Lung apices clear. Asymmetric soft tissue at the right lateral aspect of the pharynx question 13 mm nodule/mass.  IMPRESSION: Degenerative disc disease changes of the cervical spine greatest at C5-C6 and C6-C7. No acute cervical spine abnormalities. Question soft tissue mass/nodule at right lateral aspect of the pharynx 13 mm diameter, recommend direct visualization to exclude tumor.  Findings called to Dr. Ranae Palms on 12/11/2012  at 1918 hrs.   Original Report Authenticated By: Ulyses Southward, M.D.       Assessment & Plan:   Scalp laceration, well approximated, no drainage - staples removed today - no residual complications - aftercare instructions provided  R elbow laceration - sutures removed today - no evidence for infection/ complications - aftercare instructions provided  pharyngeal mass? - as identified on CT c-spine 12/11/12 - pt aware of same- ENT apt for Fri 12/24/12  anti coag monitoring - check INR today with [facility]

## 2013-01-28 ENCOUNTER — Ambulatory Visit (INDEPENDENT_AMBULATORY_CARE_PROVIDER_SITE_OTHER): Payer: Medicare Other | Admitting: General Practice

## 2013-01-28 DIAGNOSIS — I2699 Other pulmonary embolism without acute cor pulmonale: Secondary | ICD-10-CM

## 2013-01-28 DIAGNOSIS — I82409 Acute embolism and thrombosis of unspecified deep veins of unspecified lower extremity: Secondary | ICD-10-CM

## 2013-01-28 DIAGNOSIS — Z7901 Long term (current) use of anticoagulants: Secondary | ICD-10-CM

## 2013-01-28 LAB — POCT INR: INR: 2.4

## 2013-02-16 ENCOUNTER — Other Ambulatory Visit: Payer: Self-pay | Admitting: Internal Medicine

## 2013-02-24 ENCOUNTER — Other Ambulatory Visit: Payer: Self-pay | Admitting: Internal Medicine

## 2013-03-11 ENCOUNTER — Ambulatory Visit (INDEPENDENT_AMBULATORY_CARE_PROVIDER_SITE_OTHER): Payer: Medicare Other | Admitting: General Practice

## 2013-03-11 DIAGNOSIS — I2699 Other pulmonary embolism without acute cor pulmonale: Secondary | ICD-10-CM

## 2013-03-11 DIAGNOSIS — Z7901 Long term (current) use of anticoagulants: Secondary | ICD-10-CM

## 2013-03-11 DIAGNOSIS — I82409 Acute embolism and thrombosis of unspecified deep veins of unspecified lower extremity: Secondary | ICD-10-CM

## 2013-03-14 ENCOUNTER — Ambulatory Visit: Payer: Medicare Other | Admitting: Cardiology

## 2013-03-21 ENCOUNTER — Other Ambulatory Visit: Payer: Self-pay | Admitting: Internal Medicine

## 2013-04-22 ENCOUNTER — Ambulatory Visit (INDEPENDENT_AMBULATORY_CARE_PROVIDER_SITE_OTHER): Payer: Medicare Other | Admitting: General Practice

## 2013-04-22 DIAGNOSIS — I2699 Other pulmonary embolism without acute cor pulmonale: Secondary | ICD-10-CM

## 2013-04-22 DIAGNOSIS — Z7901 Long term (current) use of anticoagulants: Secondary | ICD-10-CM

## 2013-04-22 DIAGNOSIS — I82409 Acute embolism and thrombosis of unspecified deep veins of unspecified lower extremity: Secondary | ICD-10-CM

## 2013-04-28 ENCOUNTER — Other Ambulatory Visit: Payer: Self-pay | Admitting: Internal Medicine

## 2013-04-28 NOTE — Telephone Encounter (Signed)
Faxed hardcopy to [facility] [place] 

## 2013-04-28 NOTE — Telephone Encounter (Signed)
Done hardcopy to robin  

## 2013-05-25 ENCOUNTER — Other Ambulatory Visit: Payer: Self-pay | Admitting: Internal Medicine

## 2013-05-31 ENCOUNTER — Ambulatory Visit (INDEPENDENT_AMBULATORY_CARE_PROVIDER_SITE_OTHER): Payer: Medicare Other | Admitting: Family Medicine

## 2013-05-31 ENCOUNTER — Ambulatory Visit (INDEPENDENT_AMBULATORY_CARE_PROVIDER_SITE_OTHER): Payer: Medicare Other | Admitting: Internal Medicine

## 2013-05-31 ENCOUNTER — Encounter: Payer: Self-pay | Admitting: Internal Medicine

## 2013-05-31 ENCOUNTER — Other Ambulatory Visit (INDEPENDENT_AMBULATORY_CARE_PROVIDER_SITE_OTHER): Payer: Medicare Other

## 2013-05-31 ENCOUNTER — Ambulatory Visit (INDEPENDENT_AMBULATORY_CARE_PROVIDER_SITE_OTHER)
Admission: RE | Admit: 2013-05-31 | Discharge: 2013-05-31 | Disposition: A | Discharge status: Home / Self Care | Payer: Medicare Other | Source: Ambulatory Visit | Attending: Internal Medicine | Admitting: Internal Medicine

## 2013-05-31 VITALS — BP 112/74 | HR 74 | Temp 100.5°F | Ht 68.0 in | Wt 177.2 lb

## 2013-05-31 DIAGNOSIS — R61 Generalized hyperhidrosis: Secondary | ICD-10-CM | POA: Insufficient documentation

## 2013-05-31 DIAGNOSIS — E1149 Type 2 diabetes mellitus with other diabetic neurological complication: Secondary | ICD-10-CM

## 2013-05-31 DIAGNOSIS — I2699 Other pulmonary embolism without acute cor pulmonale: Secondary | ICD-10-CM

## 2013-05-31 DIAGNOSIS — R509 Fever, unspecified: Secondary | ICD-10-CM

## 2013-05-31 DIAGNOSIS — G609 Hereditary and idiopathic neuropathy, unspecified: Secondary | ICD-10-CM

## 2013-05-31 DIAGNOSIS — E1142 Type 2 diabetes mellitus with diabetic polyneuropathy: Secondary | ICD-10-CM

## 2013-05-31 DIAGNOSIS — E1140 Type 2 diabetes mellitus with diabetic neuropathy, unspecified: Secondary | ICD-10-CM

## 2013-05-31 DIAGNOSIS — E785 Hyperlipidemia, unspecified: Secondary | ICD-10-CM

## 2013-05-31 DIAGNOSIS — Z0000 Encounter for general adult medical examination without abnormal findings: Secondary | ICD-10-CM

## 2013-05-31 DIAGNOSIS — G629 Polyneuropathy, unspecified: Secondary | ICD-10-CM

## 2013-05-31 DIAGNOSIS — I82409 Acute embolism and thrombosis of unspecified deep veins of unspecified lower extremity: Secondary | ICD-10-CM

## 2013-05-31 DIAGNOSIS — Z7901 Long term (current) use of anticoagulants: Secondary | ICD-10-CM

## 2013-05-31 LAB — CBC WITH DIFFERENTIAL/PLATELET
Basophils Absolute: 0.1 10*3/uL (ref 0.0–0.1)
Eosinophils Relative: 1.2 % (ref 0.0–5.0)
Lymphocytes Relative: 41.9 % (ref 12.0–46.0)
Monocytes Relative: 7.8 % (ref 3.0–12.0)
Neutrophils Relative %: 48.6 % (ref 43.0–77.0)
Platelets: 292 10*3/uL (ref 150.0–400.0)
WBC: 12.8 10*3/uL — ABNORMAL HIGH (ref 4.5–10.5)

## 2013-05-31 LAB — HEPATIC FUNCTION PANEL
ALT: 14 U/L (ref 0–35)
AST: 15 U/L (ref 0–37)
Albumin: 3.7 g/dL (ref 3.5–5.2)
Total Protein: 7.1 g/dL (ref 6.0–8.3)

## 2013-05-31 LAB — URINALYSIS, ROUTINE W REFLEX MICROSCOPIC
Bilirubin Urine: NEGATIVE
Leukocytes, UA: NEGATIVE
Nitrite: NEGATIVE
Urobilinogen, UA: 0.2 (ref 0.0–1.0)

## 2013-05-31 LAB — HEMOGLOBIN A1C: Hgb A1c MFr Bld: 6.2 % (ref 4.6–6.5)

## 2013-05-31 LAB — BASIC METABOLIC PANEL
BUN: 16 mg/dL (ref 6–23)
CO2: 31 mEq/L (ref 19–32)
Chloride: 105 mEq/L (ref 96–112)
GFR: 61.38 mL/min (ref >60.00)
Glucose, Bld: 76 mg/dL (ref 70–99)
Potassium: 3.8 mEq/L (ref 3.5–5.1)

## 2013-05-31 LAB — POCT INR: INR: 2.5

## 2013-05-31 LAB — LIPID PANEL: HDL: 35.3 mg/dL — ABNORMAL LOW (ref >39.00)

## 2013-05-31 MED ORDER — AMITRIPTYLINE HCL 100 MG PO TABS: ORAL_TABLET | ORAL | Status: DC

## 2013-05-31 NOTE — Assessment & Plan Note (Signed)
Ok for elavil qhs prn,  to f/u any worsening symptoms or concerns  

## 2013-05-31 NOTE — Assessment & Plan Note (Signed)
Etiology unclear, .for labs,  to f/u any worsening symptoms or concerns  

## 2013-05-31 NOTE — Assessment & Plan Note (Addendum)
stable overall by history and exam, recent data reviewed with pt, and pt to continue medical treatment as before,  to f/u any worsening symptoms or concerns Lab Results  Component Value Date   CHOL 205* 05/31/2013   HDL 35.30* 05/31/2013   LDLDIRECT 113.4 05/31/2013   TRIG 329.0* 05/31/2013   CHOLHDL 6 05/31/2013   Consider other statin than lipitor

## 2013-05-31 NOTE — Patient Instructions (Addendum)
Please take all new medication as prescribed - the generic elavil for at bedtime for sleep and pain Please continue all other medications as before Please have the pharmacy call with any other refills you may need. Please go to the XRAY Department in the Basement (go straight as you get off the elevator) for the x-ray testing  Please go to the LAB in the Basement (turn left off the elevator) for the tests to be done today You will be contacted by phone if any changes need to be made immediately.  Otherwise, you will receive a letter about your results with an explanation  Please remember to sign up for My Chart if you have not done so, as this will be important to you in the future with finding out test results, communicating by private email, and scheduling acute appointments online when needed.   Please return in 6 months, or sooner if needed

## 2013-05-31 NOTE — Assessment & Plan Note (Signed)
Exam bening, for cxr

## 2013-05-31 NOTE — Assessment & Plan Note (Signed)
stable overall by history and exam, recent data reviewed with pt, and pt to continue medical treatment as before,  to f/u any worsening symptoms or concerns Lab Results  Component Value Date   HGBA1C 6.2 05/31/2013

## 2013-05-31 NOTE — Progress Notes (Signed)
Subjective:    Patient ID: Briana Hartman, female    DOB: 24-Oct-1934, 79 y.o.   MRN: [id number]  HPI   Here to f/u; overall doing ok,  Pt denies chest pain, increased sob or doe, wheezing, orthopnea, PND, increased LE swelling, palpitations, dizziness or syncope.  Pt denies polydipsia, polyuria, or low sugar symptoms such as weakness or confusion improved with po intake.  Pt denies new neurological symptoms such as new headache, or facial or extremity weakness, but has numbness and increased qhs discomfort related to her neuropathy in last few months.   Pt states overall good compliance with meds, has been trying to follow lower cholesterol diet, with wt overall stable,  but little exercise however.  Does c/o ongoing fatigue, but denies signficant daytime hypersomnolence, depression and recent sleep study neg.  Has significant night sweats and low grade temp she is unaware of today,  Denies urinary symptoms such as dysuria, frequency, urgency, flank pain, hematuria or n/v, fever, chills.    Sister with recent pancreatic ca, now with hospice. Denies worsening depressive symptoms, suicidal ideation, or panic; has ongoing anxiety. Not taking lipitor due to side effects Past Medical History  Diagnosis Date  . Adenomatous colon polyp   . Irritable bowel syndrome   . MITRAL VALVE PROLAPSE   . 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 VENTRAL HERNIA   . Arthritis   . Skin cancer   . COPD (chronic obstructive pulmonary disease)   . Diabetes mellitus   . ACID REFLUX DISEASE   . Hyperlipidemia   . HYPERTENSION   . HYPOTHYROIDISM   . Lumbar spinal stenosis   . Peripheral neuropathy   . Anxiety   . Asthma   . Anemia   . Long term current use of anticoagulant   . Cervical radiculopathy    Past Surgical History  Procedure Laterality Date  . Appendectomy    . Cesarean section    . Cataract surgery    . Cholecystectomy    . Varicose vein surgery    . Abdominal hysterectomy     . Colonoscopy  05/24/2004    interal hemorrhoids, diverticulosis  . Upper gastrointestinal endoscopy  12/31/2007    gastric polyp    reports that she has never smoked. She does not have any smokeless tobacco history on file. She reports that she does not drink alcohol or use illicit drugs. family history includes Breast cancer in her sister; Clotting disorder in her mother; Diabetes in her brother and sister; and Heart disease in her mother and sister. Allergies  Allergen Reactions  . Ampicillin   . Aspirin   . Caffeine   . Codeine   . Lipitor (Atorvastatin) Other (See Comments)    Headache, numbness  . Penicillins    Current Outpatient Prescriptions on File Prior to Visit  Medication Sig Dispense Refill  . amLODipine (NORVASC) 5 MG tablet TAKE ONE TABLET BY MOUTH ONE TIME DAILY  30 tablet  5  . BAYER CONTOUR TEST test strip as directed.      Marland Kitchen BAYER MICROLET LANCETS lancets as directed.      . citalopram (CELEXA) 10 MG tablet Take 1 tablet (10 mg total) by mouth daily.  90 tablet  3  . Cyanocobalamin (VITAMIN B-12 IJ) Inject as directed every 30 (thirty) days.        . furosemide (LASIX) 40 MG tablet TAKE ONE TABLET BY MOUTH TWICE DAILY  60 tablet  11  .  levothyroxine (SYNTHROID, LEVOTHROID) 25 MCG tablet TAKE ONE TABLET BY MOUTH ONE TIME DAILY  90 tablet  3  . LORazepam (ATIVAN) 1 MG tablet TAKE ONE TABLET BY MOUTH TWICE A DAY AS NEEDED FOR ANXIETY  60 tablet  3  . losartan (COZAAR) 100 MG tablet TAKE ONE TABLET BY MOUTH ONE TIME DAILY  30 tablet  9  . metoprolol (LOPRESSOR) 50 MG tablet Take 1 tablet (50 mg total) by mouth 2 (two) times daily.  180 tablet  3  . nitroGLYCERIN (NITROSTAT) 0.4 MG SL tablet Place 0.4 mg under the tongue every 5 (five) minutes as needed.        Marland Kitchen omeprazole (PRILOSEC) 20 MG capsule Take 20 mg by mouth daily. As needed      . warfarin (COUMADIN) 6 MG tablet TAKE ONE TABLET BY MOUTH AS DIRECTED BY COUMADIN CLINIC  40 tablet  0   No current  facility-administered medications on file prior to visit.   Review of Systems  Constitutional: Negative for unexpected weight change, or unusual diaphoresis  HENT: Negative for tinnitus.   Eyes: Negative for photophobia and visual disturbance.  Respiratory: Negative for choking and stridor.   Gastrointestinal: Negative for vomiting and blood in stool.  Genitourinary: Negative for hematuria and decreased urine volume.  Musculoskeletal: Negative for acute joint swelling Skin: Negative for color change and wound.  Neurological: Negative for tremors and numbness other than noted  Psychiatric/Behavioral: Negative for decreased concentration or  hyperactivity.       Objective:   Physical Exam BP 112/74  Pulse 74  Temp(Src) 100.5 F (38.1 C) (Oral)  Ht 5\' 8"  (1.727 m)  Wt 177 lb 4 oz (80.4 kg)  BMI 26.96 kg/m2  SpO2 94% VS noted,  Constitutional: Pt appears well-developed and well-nourished.  HENT: Head: NCAT.  Right Ear: External ear normal.  Left Ear: External ear normal.  Eyes: Conjunctivae and EOM are normal. Pupils are equal, round, and reactive to light.  Neck: Normal range of motion. Neck supple.  Cardiovascular: Normal rate and regular rhythm.   Pulmonary/Chest: Effort normal and breath sounds normal.  Abd:  Soft, NT, non-distended, + BS Neurological: Pt is alert. Not confused , distal LE with decreased sens to LT Skin: Skin is warm. No erythema.  Psychiatric: Pt behavior is normal. Thought content normal.     Assessment & Plan:

## 2013-06-02 ENCOUNTER — Telehealth: Payer: Self-pay

## 2013-06-02 NOTE — Telephone Encounter (Signed)
Patient informed of results and letter mailed on May 31, 2013.

## 2013-06-02 NOTE — Telephone Encounter (Signed)
Patient called [facility] requesting that Briana Hartman return her call regarding xray results. Thanks

## 2013-07-05 ENCOUNTER — Other Ambulatory Visit: Payer: Self-pay | Admitting: Internal Medicine

## 2013-07-12 ENCOUNTER — Ambulatory Visit (INDEPENDENT_AMBULATORY_CARE_PROVIDER_SITE_OTHER): Payer: Medicare Other | Admitting: General Practice

## 2013-07-12 DIAGNOSIS — I2699 Other pulmonary embolism without acute cor pulmonale: Secondary | ICD-10-CM

## 2013-07-12 DIAGNOSIS — I82409 Acute embolism and thrombosis of unspecified deep veins of unspecified lower extremity: Secondary | ICD-10-CM

## 2013-07-12 DIAGNOSIS — Z7901 Long term (current) use of anticoagulants: Secondary | ICD-10-CM

## 2013-07-31 ENCOUNTER — Other Ambulatory Visit: Payer: Self-pay | Admitting: Internal Medicine

## 2013-08-01 NOTE — Telephone Encounter (Signed)
Should have one more refill I suspect on the may refill ativan

## 2013-08-09 ENCOUNTER — Other Ambulatory Visit: Payer: Self-pay | Admitting: Internal Medicine

## 2013-08-09 NOTE — Telephone Encounter (Signed)
Faxed hardcopy to [facility]

## 2013-08-09 NOTE — Telephone Encounter (Signed)
Done hardcopy to robin  

## 2013-08-24 ENCOUNTER — Ambulatory Visit (INDEPENDENT_AMBULATORY_CARE_PROVIDER_SITE_OTHER): Payer: Medicare Other | Admitting: General Practice

## 2013-08-24 DIAGNOSIS — Z7901 Long term (current) use of anticoagulants: Secondary | ICD-10-CM

## 2013-08-24 DIAGNOSIS — I2699 Other pulmonary embolism without acute cor pulmonale: Secondary | ICD-10-CM

## 2013-08-24 DIAGNOSIS — I82409 Acute embolism and thrombosis of unspecified deep veins of unspecified lower extremity: Secondary | ICD-10-CM

## 2013-08-24 LAB — POCT INR: INR: 2.5

## 2013-09-19 ENCOUNTER — Encounter: Payer: Self-pay | Admitting: Internal Medicine

## 2013-09-19 ENCOUNTER — Ambulatory Visit (INDEPENDENT_AMBULATORY_CARE_PROVIDER_SITE_OTHER): Payer: Medicare Other | Admitting: Internal Medicine

## 2013-09-19 VITALS — BP 138/80 | HR 78 | Temp 97.9°F | Wt 184.2 lb

## 2013-09-19 DIAGNOSIS — J309 Allergic rhinitis, unspecified: Secondary | ICD-10-CM

## 2013-09-19 DIAGNOSIS — J019 Acute sinusitis, unspecified: Secondary | ICD-10-CM

## 2013-09-19 MED ORDER — CEFUROXIME AXETIL 250 MG PO TABS: 250.0000 mg | ORAL_TABLET | Freq: Two times a day (BID) | ORAL | Status: DC

## 2013-09-19 MED ORDER — BENZONATATE 100 MG PO CAPS: 100.0000 mg | ORAL_CAPSULE | Freq: Two times a day (BID) | ORAL | Status: DC | PRN

## 2013-09-19 NOTE — Progress Notes (Signed)
HPI: Pt presents to office today with one week of head and chest congestion, fever, nasal drainage, yellow/green sputum production, cough, and wheezing. Pt states her head feels "full" and endorses ear pain/fullness. Pt denies shortness of breath, chest pain, and chest tightness. Pt denies any sick contacts. Pt tried taking OTC Tylenol and Robitussin with minimal relief. Pt states he pain is 6/10 and feels very uncomfortable.   Past Medical History  Diagnosis Date  . Adenomatous colon polyp   . Irritable bowel syndrome   . MITRAL VALVE PROLAPSE   . 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 VENTRAL HERNIA   . Arthritis   . Skin cancer   . COPD (chronic obstructive pulmonary disease)   . Diabetes mellitus   . ACID REFLUX DISEASE   . Hyperlipidemia   . HYPERTENSION   . HYPOTHYROIDISM   . Lumbar spinal stenosis   . Peripheral neuropathy   . Anxiety   . Asthma   . Anemia   . Long term current use of anticoagulant   . Cervical radiculopathy     Current Outpatient Prescriptions  Medication Sig Dispense Refill  . amitriptyline (ELAVIL) 100 MG tablet 1/2 - 1 tab by mouth at bedtime as needed for sleep, pain  90 tablet  1  . amLODipine (NORVASC) 5 MG tablet TAKE ONE TABLET BY MOUTH ONE TIME DAILY  30 tablet  5  . BAYER CONTOUR TEST test strip as directed.      Marland Kitchen BAYER MICROLET LANCETS lancets as directed.      . citalopram (CELEXA) 10 MG tablet Take 1 tablet (10 mg total) by mouth daily.  90 tablet  3  . Cyanocobalamin (VITAMIN B-12 IJ) Inject as directed every 30 (thirty) days.        . furosemide (LASIX) 40 MG tablet TAKE ONE TABLET BY MOUTH TWICE DAILY  60 tablet  11  . levothyroxine (SYNTHROID, LEVOTHROID) 25 MCG tablet TAKE ONE TABLET BY MOUTH ONE TIME DAILY  90 tablet  3  . LORazepam (ATIVAN) 1 MG tablet TAKE 1 TABLET BY MOUTH TWICE DAILY AS NEEDED FOR ANXIETY  60 tablet  2  . losartan (COZAAR) 100 MG tablet TAKE ONE TABLET BY MOUTH ONE TIME DAILY  30 tablet  9  .  metoprolol (LOPRESSOR) 50 MG tablet Take 1 tablet (50 mg total) by mouth 2 (two) times daily.  180 tablet  3  . nitroGLYCERIN (NITROSTAT) 0.4 MG SL tablet Place 0.4 mg under the tongue every 5 (five) minutes as needed.        Marland Kitchen omeprazole (PRILOSEC) 20 MG capsule Take 20 mg by mouth daily. As needed      . warfarin (COUMADIN) 6 MG tablet TAKE 1 TABLET BY MOUTH AS DIRECTED BY COUMADIN CLINIC  35 tablet  2   No current facility-administered medications for this visit.    Allergies  Allergen Reactions  . Ampicillin   . Aspirin   . Caffeine   . Codeine   . Lipitor [Atorvastatin] Other (See Comments)    Headache, numbness  . Penicillins      ROS:  Constitutional: Endorses fever, chills, headache, and malaise. Denies abrupt weight changes.  HEENT: Endorses bilateral ear pain, nasal discharge/congestion, and sore throat. Denies eye pain or eye redness. Respiratory: Endorses cough, chest congestions, wheezing, and sputum production. Denies difficulty breathing or shortness of breath,  Cardiovascular: Denies chest pain, chest tightness, palpitations or swelling in the hands or feet.  Neurological: Denies  dizziness, difficulty with memory, difficulty with speech or problems with balance and coordination.   No other specific complaints in a complete review of systems (except as listed in HPI above).  PE:  BP 138/80  Pulse 78  Temp(Src) 97.9 F (36.6 C) (Oral)  Wt 184 lb 4 oz (83.575 kg)  BMI 28.02 kg/m2  SpO2 94% Wt Readings from Last 3 Encounters:  09/19/13 184 lb 4 oz (83.575 kg)  05/31/13 177 lb 4 oz (80.4 kg)  12/17/12 174 lb (78.926 kg)    General: Appears their stated age, well developed, well nourished in NAD. HEENT: Head: normal shape and size; Eyes: sclera white, no icterus, conjunctiva pink, PERRLA and EOMs intact; Ears: Tm's gray and intact, serous fluid noted right ear, normal light reflex; Nose: mucosa pink and moist, purulent drainage, unilateral maxillary/frontal  tenderness to right side, septum midline; Throat/Mouth: Teeth present, mucosa pink and moist, no lesions or ulcerations noted.  Neck: Normal range of motion. Neck supple, trachea midline. No massses, lumps or thyromegaly present.  Cardiovascular: Normal rate and rhythm. S1,S2 noted.  No murmur, rubs or gallops noted. No JVD or BLE edema. No carotid bruits noted. Pulmonary/Chest: Normal effort and positive vesicular breath sounds. No respiratory distress. No wheezes, rales or ronchi noted.    Assessment and Plan: Acute Sinusitis Start Ceftin 250mg  by mouth twice daily for 10 days (20) no refills Increase fluids and rest Follow up in 3 days if symptoms do not improve  Cough Tessalon pearles 200mg  three times a day as needed for cough   Laithan Conchas S, Student-NP

## 2013-09-19 NOTE — Patient Instructions (Signed)

## 2013-09-19 NOTE — Progress Notes (Signed)
HPI  Pt presents to the clinic today with c/o headache. Nasal congestion, cough and fatigue. This started 1 week ago. She also reports she has had fever, chills and body aches. She has taken Robitussin OTC and tylenol without much relief. She does have a history of allergy but does not take an antihistamine because she is unsure if it will affect her coumadin. She has had sick contacts.  Review of Systems    Past Medical History  Diagnosis Date  . Adenomatous colon polyp   . Irritable bowel syndrome   . MITRAL VALVE PROLAPSE   . 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 VENTRAL HERNIA   . Arthritis   . Skin cancer   . COPD (chronic obstructive pulmonary disease)   . Diabetes mellitus   . ACID REFLUX DISEASE   . Hyperlipidemia   . HYPERTENSION   . HYPOTHYROIDISM   . Lumbar spinal stenosis   . Peripheral neuropathy   . Anxiety   . Asthma   . Anemia   . Long term current use of anticoagulant   . Cervical radiculopathy     Family History  Problem Relation Age of Onset  . Heart disease Mother   . Clotting disorder Mother   . Breast cancer Sister   . Diabetes Sister     x 3  . Heart disease Sister   . Diabetes Brother     x 3    History   Social History  . Marital Status: Married    Spouse Name: N/A    Number of Children: 3  . Years of Education: N/A   Occupational History  . baker at [employer]     Social History Main Topics  . Smoking status: Never Smoker   . Smokeless tobacco: Not on file  . Alcohol Use: No  . Drug Use: No  . Sexual Activity: Not on file   Other Topics Concern  . Not on file   Social History Narrative  . No narrative on file    Allergies  Allergen Reactions  . Ampicillin   . Aspirin   . Caffeine   . Codeine   . Lipitor [Atorvastatin] Other (See Comments)    Headache, numbness  . Penicillins      Constitutional: Positive headache, fatigue and fever. Denies abrupt weight changes.  HEENT:  Positive eye pain, pressure  behind the eyes, facial pain, nasal congestion and sore throat. Denies eye redness, ear pain, ringing in the ears, wax buildup, runny nose or bloody nose. Respiratory: Positive cough and thick green sputum production. Denies difficulty breathing or shortness of breath.  Cardiovascular: Denies chest pain, chest tightness, palpitations or swelling in the hands or feet.   No other specific complaints in a complete review of systems (except as listed in HPI above).  Objective:    BP 138/80  Pulse 78  Temp(Src) 97.9 F (36.6 C) (Oral)  Wt 184 lb 4 oz (83.575 kg)  BMI 28.02 kg/m2  SpO2 94% Wt Readings from Last 3 Encounters:  09/19/13 184 lb 4 oz (83.575 kg)  05/31/13 177 lb 4 oz (80.4 kg)  12/17/12 174 lb (78.926 kg)    General: Appears his stated age, well developed, well nourished in NAD. HEENT: Head: normal shape and size; Eyes: sclera white, no icterus, conjunctiva pink, PERRLA and EOMs intact; Ears: Tm's gray and intact, normal light reflex; Nose: mucosa pink and moist, septum midline; Throat/Mouth: + PND. Teeth present, mucosa pink and  moist, no exudate noted, no lesions or ulcerations noted.  Neck: Mild cervical lymphadenopathy. Neck supple, trachea midline. No massses, lumps or thyromegaly present.  Cardiovascular: Normal rate and rhythm. S1,S2 noted.  No murmur, rubs or gallops noted. No JVD or BLE edema. No carotid bruits noted. Pulmonary/Chest: Normal effort and positive vesicular breath sounds. No respiratory distress. No wheezes, rales or ronchi noted.      Assessment & Plan:   Acute sinusitis:  Can use a Neti Pot which can be purchased from your local drug store. OTC zyrtec at night Drink lots of fluids to avoid dehydration eRx for Ceftin 250 mg BID x 10 days eRx for tessalon pearls  RTC as needed or if symptoms persist.

## 2013-09-27 ENCOUNTER — Other Ambulatory Visit: Payer: Self-pay | Admitting: Internal Medicine

## 2013-10-02 ENCOUNTER — Other Ambulatory Visit: Payer: Self-pay | Admitting: Internal Medicine

## 2013-10-03 NOTE — Telephone Encounter (Signed)
Pt was rx 3 mo total ativan  On Aug 09, 2013, too soon for refill

## 2013-10-05 ENCOUNTER — Ambulatory Visit: Payer: Medicare Other

## 2013-10-07 ENCOUNTER — Ambulatory Visit (INDEPENDENT_AMBULATORY_CARE_PROVIDER_SITE_OTHER): Payer: Medicare Other | Admitting: General Practice

## 2013-10-07 DIAGNOSIS — I2699 Other pulmonary embolism without acute cor pulmonale: Secondary | ICD-10-CM

## 2013-10-07 DIAGNOSIS — Z23 Encounter for immunization: Secondary | ICD-10-CM

## 2013-10-07 DIAGNOSIS — I82409 Acute embolism and thrombosis of unspecified deep veins of unspecified lower extremity: Secondary | ICD-10-CM

## 2013-10-07 DIAGNOSIS — Z7901 Long term (current) use of anticoagulants: Secondary | ICD-10-CM

## 2013-10-07 LAB — POCT INR: INR: 2.5

## 2013-10-17 ENCOUNTER — Other Ambulatory Visit: Payer: Self-pay | Admitting: General Practice

## 2013-10-17 ENCOUNTER — Other Ambulatory Visit: Payer: Self-pay | Admitting: Internal Medicine

## 2013-10-17 MED ORDER — WARFARIN SODIUM 6 MG PO TABS: ORAL_TABLET | ORAL | Status: DC

## 2013-10-26 ENCOUNTER — Other Ambulatory Visit: Payer: Self-pay | Admitting: Internal Medicine

## 2013-10-26 NOTE — Telephone Encounter (Signed)
Done hardcopy to robin  

## 2013-10-27 NOTE — Telephone Encounter (Signed)
Faxed hardcopy to [facility] [address] [place] 

## 2013-11-21 ENCOUNTER — Other Ambulatory Visit: Payer: Self-pay | Admitting: Internal Medicine

## 2013-11-22 NOTE — Telephone Encounter (Signed)
Faxed hardcopy to [facility] [place] 

## 2013-11-22 NOTE — Telephone Encounter (Signed)
Done hardcopy to robin  

## 2013-11-23 ENCOUNTER — Ambulatory Visit (INDEPENDENT_AMBULATORY_CARE_PROVIDER_SITE_OTHER): Payer: Medicare Other | Admitting: General Practice

## 2013-11-23 ENCOUNTER — Telehealth: Payer: Self-pay | Admitting: Internal Medicine

## 2013-11-23 DIAGNOSIS — Z7901 Long term (current) use of anticoagulants: Secondary | ICD-10-CM

## 2013-11-23 DIAGNOSIS — I82409 Acute embolism and thrombosis of unspecified deep veins of unspecified lower extremity: Secondary | ICD-10-CM

## 2013-11-23 DIAGNOSIS — I2699 Other pulmonary embolism without acute cor pulmonale: Secondary | ICD-10-CM

## 2013-11-23 LAB — POCT INR: INR: 2.3

## 2013-11-23 NOTE — Telephone Encounter (Signed)
Message copied by Corwin Levins on Wed Nov 23, 2013  7:13 PM ------      Message from: Lodema Pilot D      Created: Wed Nov 23, 2013  8:29 AM       Patient want to get Prevnar today when she come to have INR checked.  Can I give it to her?            Thanks,      Arline Asp ------

## 2013-11-23 NOTE — Telephone Encounter (Signed)
Yes, since she is due and has been more than 2 wks since the flu shot

## 2013-11-23 NOTE — Progress Notes (Signed)
Pre-visit discussion using our clinic review tool. No additional management support is needed unless otherwise documented below in the visit note.  

## 2013-11-29 ENCOUNTER — Other Ambulatory Visit: Payer: Self-pay | Admitting: Internal Medicine

## 2013-12-17 ENCOUNTER — Other Ambulatory Visit: Payer: Self-pay | Admitting: Internal Medicine

## 2014-01-04 ENCOUNTER — Ambulatory Visit (INDEPENDENT_AMBULATORY_CARE_PROVIDER_SITE_OTHER): Payer: Managed Care, Other (non HMO) | Admitting: General Practice

## 2014-01-04 DIAGNOSIS — Z7901 Long term (current) use of anticoagulants: Secondary | ICD-10-CM

## 2014-01-04 DIAGNOSIS — I2699 Other pulmonary embolism without acute cor pulmonale: Secondary | ICD-10-CM

## 2014-01-04 DIAGNOSIS — I82409 Acute embolism and thrombosis of unspecified deep veins of unspecified lower extremity: Secondary | ICD-10-CM

## 2014-01-04 LAB — POCT INR: INR: 3.1

## 2014-01-04 NOTE — Progress Notes (Signed)
Pre-visit discussion using our clinic review tool. No additional management support is needed unless otherwise documented below in the visit note.  

## 2014-01-10 ENCOUNTER — Other Ambulatory Visit: Payer: Self-pay

## 2014-01-10 MED ORDER — FUROSEMIDE 40 MG PO TABS: 40.0000 mg | ORAL_TABLET | Freq: Two times a day (BID) | ORAL | Status: AC

## 2014-01-10 MED ORDER — LORAZEPAM 1 MG PO TABS: 1.0000 mg | ORAL_TABLET | Freq: Two times a day (BID) | ORAL | Status: DC | PRN

## 2014-01-10 NOTE — Telephone Encounter (Signed)
Faxed [facility] to [facility].../lmb 

## 2014-01-10 NOTE — Telephone Encounter (Signed)
Done hardcopy to robin  

## 2014-02-15 ENCOUNTER — Ambulatory Visit (INDEPENDENT_AMBULATORY_CARE_PROVIDER_SITE_OTHER): Payer: Managed Care, Other (non HMO) | Admitting: General Practice

## 2014-02-15 ENCOUNTER — Other Ambulatory Visit: Payer: Self-pay | Admitting: Internal Medicine

## 2014-02-15 DIAGNOSIS — I82409 Acute embolism and thrombosis of unspecified deep veins of unspecified lower extremity: Secondary | ICD-10-CM

## 2014-02-15 DIAGNOSIS — Z5181 Encounter for therapeutic drug level monitoring: Secondary | ICD-10-CM | POA: Insufficient documentation

## 2014-02-15 DIAGNOSIS — Z7901 Long term (current) use of anticoagulants: Secondary | ICD-10-CM

## 2014-02-15 DIAGNOSIS — I2699 Other pulmonary embolism without acute cor pulmonale: Secondary | ICD-10-CM

## 2014-02-15 LAB — POCT INR: INR: 2.9

## 2014-02-15 NOTE — Progress Notes (Signed)
Pre visit review using our clinic review tool, if applicable. No additional management support is needed unless otherwise documented below in the visit note. 

## 2014-02-24 ENCOUNTER — Other Ambulatory Visit: Payer: Self-pay | Admitting: Internal Medicine

## 2014-03-15 ENCOUNTER — Other Ambulatory Visit: Payer: Self-pay | Admitting: Internal Medicine

## 2014-03-16 NOTE — Telephone Encounter (Signed)
Called in prescription to [facility]. Lorazepam 1 mg #60 with 1 RF.

## 2014-03-29 ENCOUNTER — Ambulatory Visit (INDEPENDENT_AMBULATORY_CARE_PROVIDER_SITE_OTHER): Payer: Managed Care, Other (non HMO) | Admitting: General Practice

## 2014-03-29 DIAGNOSIS — Z7901 Long term (current) use of anticoagulants: Secondary | ICD-10-CM

## 2014-03-29 DIAGNOSIS — I2699 Other pulmonary embolism without acute cor pulmonale: Secondary | ICD-10-CM

## 2014-03-29 DIAGNOSIS — Z5181 Encounter for therapeutic drug level monitoring: Secondary | ICD-10-CM

## 2014-03-29 DIAGNOSIS — I82409 Acute embolism and thrombosis of unspecified deep veins of unspecified lower extremity: Secondary | ICD-10-CM

## 2014-03-29 LAB — POCT INR: INR: 2.7

## 2014-03-29 NOTE — Progress Notes (Signed)
Pre visit review using our clinic review tool, if applicable. No additional management support is needed unless otherwise documented below in the visit note. 

## 2014-04-14 ENCOUNTER — Ambulatory Visit (INDEPENDENT_AMBULATORY_CARE_PROVIDER_SITE_OTHER): Payer: Managed Care, Other (non HMO) | Admitting: Cardiovascular Disease

## 2014-04-14 ENCOUNTER — Encounter: Payer: Self-pay | Admitting: Cardiovascular Disease

## 2014-04-14 VITALS — BP 150/82 | HR 86 | Ht 68.0 in | Wt 181.0 lb

## 2014-04-14 DIAGNOSIS — E1142 Type 2 diabetes mellitus with diabetic polyneuropathy: Secondary | ICD-10-CM

## 2014-04-14 DIAGNOSIS — E1140 Type 2 diabetes mellitus with diabetic neuropathy, unspecified: Secondary | ICD-10-CM

## 2014-04-14 DIAGNOSIS — R002 Palpitations: Secondary | ICD-10-CM

## 2014-04-14 DIAGNOSIS — I059 Rheumatic mitral valve disease, unspecified: Secondary | ICD-10-CM

## 2014-04-14 DIAGNOSIS — E785 Hyperlipidemia, unspecified: Secondary | ICD-10-CM

## 2014-04-14 DIAGNOSIS — Z7901 Long term (current) use of anticoagulants: Secondary | ICD-10-CM

## 2014-04-14 DIAGNOSIS — I10 Essential (primary) hypertension: Secondary | ICD-10-CM

## 2014-04-14 DIAGNOSIS — E1149 Type 2 diabetes mellitus with other diabetic neurological complication: Secondary | ICD-10-CM

## 2014-04-14 NOTE — Assessment & Plan Note (Signed)
Presumed hypercoagulable state with PE and recurrent DVTls  Consider lovenox bridging if stopping coumadin INR Rx F/u clinic

## 2014-04-14 NOTE — Progress Notes (Signed)
Patient ID: Briana Hartman, female   DOB: April 02, 1934, 80 y.o.   MRN: [id number]  80 yo patient of Dr Lia Foyer Last seen in 2013  Has HTN.  History of PE in 1995 with presumed hypercoagulable state on chronic coumadin  She indicates recurrent DVT when taken off coumadin  Has fluttery feeling in chest More anxious last 8 weeks Husband had heart surgery.  Compliant with meds No chest pain , dyspnea or LE edema INR have been Rx No embolic events or TIA No bleeding issues.  Distant history of MVP  Cannot find any old echos in chart    ROS: Denies fever, malais, weight loss, blurry vision, decreased visual acuity, cough, sputum, SOB, hemoptysis, pleuritic pain, palpitaitons, heartburn, abdominal pain, melena, lower extremity edema, claudication, or rash.  All other systems reviewed and negative  General: Affect appropriate Healthy:  appears stated age 13: normal Neck supple with no adenopathy JVP normal no bruits no thyromegaly Lungs clear with no wheezing and good diaphragmatic motion Heart:  S1/S2 no murmur, no rub, gallop or click PMI normal Abdomen: benighn, BS positve, no tenderness, no AAA no bruit.  No HSM or HJR Distal pulses intact with no bruits No edema Neuro non-focal Skin warm and dry No muscular weakness   Current Outpatient Prescriptions  Medication Sig Dispense Refill  . amitriptyline (ELAVIL) 100 MG tablet 1/2 - 1 tab by mouth at bedtime as needed for sleep, pain  90 tablet  1  . cefUROXime (CEFTIN) 250 MG tablet Take 1 tablet (250 mg total) by mouth 2 (two) times daily.  20 tablet  0  . citalopram (CELEXA) 10 MG tablet Take 1 tablet (10 mg total) by mouth daily.  90 tablet  3  . furosemide (LASIX) 40 MG tablet Take 1 tablet (40 mg total) by mouth 2 (two) times daily.  60 tablet  11  . levothyroxine (SYNTHROID, LEVOTHROID) 25 MCG tablet TAKE ONE TABLET BY MOUTH ONCE DAILY  30 tablet  6  . LORazepam (ATIVAN) 1 MG tablet TAKE ONE TABLET BY MOUTH TWICE DAILY AS NEEDED  FOR ANXIETY  60 tablet  1  . losartan (COZAAR) 100 MG tablet TAKE 1 TABLET BY MOUTH EVERY DAY  30 tablet  11  . metoprolol (LOPRESSOR) 50 MG tablet TAKE ONE TABLET BY MOUTH TWICE DAILY  180 tablet  3  . nitroGLYCERIN (NITROSTAT) 0.4 MG SL tablet Place 0.4 mg under the tongue every 5 (five) minutes as needed.        Marland Kitchen omeprazole (PRILOSEC) 20 MG capsule Take 20 mg by mouth daily. As needed      . warfarin (COUMADIN) 6 MG tablet Take as directed by anticoagulation clinic  35 tablet  3   No current facility-administered medications for this visit.    Allergies  Ampicillin; Aspirin; Caffeine; Codeine; Lipitor; and Penicillins  Electrocardiogram:  SR rate 86 PVC otherwise normal   Assessment and Plan

## 2014-04-14 NOTE — Assessment & Plan Note (Signed)
Cholesterol is at goal.  Continue current dose of statin and diet Rx.  No myalgias or side effects.  F/U  LFT's in 6 months. No results found for this basename: LDLCALC             

## 2014-04-14 NOTE — Patient Instructions (Signed)

## 2014-04-14 NOTE — Assessment & Plan Note (Signed)
Discussed low carb diet.  Target hemoglobin A1c is 6.5 or less.  Continue current medications.  

## 2014-04-14 NOTE — Assessment & Plan Note (Signed)
No murmur on exam F/U echo

## 2014-04-14 NOTE — Assessment & Plan Note (Signed)
Well controlled.  Continue current medications and low sodium Dash type diet.    

## 2014-04-21 ENCOUNTER — Ambulatory Visit: Payer: Managed Care, Other (non HMO) | Admitting: Internal Medicine

## 2014-04-27 ENCOUNTER — Ambulatory Visit (HOSPITAL_COMMUNITY): Payer: Medicare HMO | Attending: Cardiovascular Disease | Admitting: Radiology

## 2014-04-27 DIAGNOSIS — R002 Palpitations: Secondary | ICD-10-CM | POA: Insufficient documentation

## 2014-04-27 DIAGNOSIS — I059 Rheumatic mitral valve disease, unspecified: Secondary | ICD-10-CM

## 2014-04-27 NOTE — Progress Notes (Signed)
Echocardiogram performed.  

## 2014-05-10 ENCOUNTER — Other Ambulatory Visit: Payer: Self-pay | Admitting: Internal Medicine

## 2014-05-10 ENCOUNTER — Ambulatory Visit (INDEPENDENT_AMBULATORY_CARE_PROVIDER_SITE_OTHER): Payer: Managed Care, Other (non HMO) | Admitting: General Practice

## 2014-05-10 ENCOUNTER — Encounter: Payer: Self-pay | Admitting: Internal Medicine

## 2014-05-10 ENCOUNTER — Ambulatory Visit (INDEPENDENT_AMBULATORY_CARE_PROVIDER_SITE_OTHER): Payer: Managed Care, Other (non HMO) | Admitting: Internal Medicine

## 2014-05-10 ENCOUNTER — Other Ambulatory Visit (INDEPENDENT_AMBULATORY_CARE_PROVIDER_SITE_OTHER): Payer: Medicare HMO

## 2014-05-10 VITALS — BP 122/70 | HR 77 | Temp 97.9°F | Ht 68.0 in | Wt 181.5 lb

## 2014-05-10 DIAGNOSIS — Z23 Encounter for immunization: Secondary | ICD-10-CM

## 2014-05-10 DIAGNOSIS — Z5181 Encounter for therapeutic drug level monitoring: Secondary | ICD-10-CM

## 2014-05-10 DIAGNOSIS — Z7901 Long term (current) use of anticoagulants: Secondary | ICD-10-CM

## 2014-05-10 DIAGNOSIS — Z0000 Encounter for general adult medical examination without abnormal findings: Secondary | ICD-10-CM

## 2014-05-10 DIAGNOSIS — E785 Hyperlipidemia, unspecified: Secondary | ICD-10-CM

## 2014-05-10 DIAGNOSIS — I10 Essential (primary) hypertension: Secondary | ICD-10-CM

## 2014-05-10 DIAGNOSIS — F419 Anxiety disorder, unspecified: Secondary | ICD-10-CM

## 2014-05-10 DIAGNOSIS — F411 Generalized anxiety disorder: Secondary | ICD-10-CM

## 2014-05-10 DIAGNOSIS — I82409 Acute embolism and thrombosis of unspecified deep veins of unspecified lower extremity: Secondary | ICD-10-CM

## 2014-05-10 DIAGNOSIS — I2699 Other pulmonary embolism without acute cor pulmonale: Secondary | ICD-10-CM

## 2014-05-10 LAB — CBC WITH DIFFERENTIAL/PLATELET
BASOS ABS: 0 10*3/uL (ref 0.0–0.1)
Basophils Relative: 0.3 % (ref 0.0–3.0)
EOS ABS: 0.2 10*3/uL (ref 0.0–0.7)
Eosinophils Relative: 1.2 % (ref 0.0–5.0)
HCT: 42.3 % (ref 36.0–46.0)
Hemoglobin: 14.1 g/dL (ref 12.0–15.0)
LYMPHS PCT: 28.4 % (ref 12.0–46.0)
Lymphs Abs: 3.7 10*3/uL (ref 0.7–4.0)
MCHC: 33.3 g/dL (ref 30.0–36.0)
MCV: 90.1 fl (ref 78.0–100.0)
Monocytes Absolute: 0.9 10*3/uL (ref 0.1–1.0)
Monocytes Relative: 7.3 % (ref 3.0–12.0)
Neutro Abs: 8.1 10*3/uL — ABNORMAL HIGH (ref 1.4–7.7)
Neutrophils Relative %: 62.8 % (ref 43.0–77.0)
Platelets: 286 10*3/uL (ref 150.0–400.0)
RBC: 4.7 Mil/uL (ref 3.87–5.11)
RDW: 14.2 % (ref 11.5–15.5)
WBC: 12.9 10*3/uL — ABNORMAL HIGH (ref 4.0–10.5)

## 2014-05-10 LAB — LIPID PANEL
Cholesterol: 208 mg/dL — ABNORMAL HIGH (ref 0–200)
HDL: 32.4 mg/dL — ABNORMAL LOW (ref >39.00)
LDL CALC: 119 mg/dL — AB (ref 0–99)
TRIGLYCERIDES: 285 mg/dL — AB (ref 0.0–149.0)
Total CHOL/HDL Ratio: 6
VLDL: 57 mg/dL — ABNORMAL HIGH (ref 0.0–40.0)

## 2014-05-10 LAB — URINALYSIS, ROUTINE W REFLEX MICROSCOPIC
Bilirubin Urine: NEGATIVE
Ketones, ur: NEGATIVE
Leukocytes, UA: NEGATIVE
Nitrite: NEGATIVE
Specific Gravity, Urine: 1.015 (ref 1.000–1.030)
Total Protein, Urine: NEGATIVE
URINE GLUCOSE: NEGATIVE
UROBILINOGEN UA: 0.2 (ref 0.0–1.0)
pH: 5.5 (ref 5.0–8.0)

## 2014-05-10 LAB — VITAMIN B12: VITAMIN B 12: 318 pg/mL (ref 211–911)

## 2014-05-10 LAB — HEPATIC FUNCTION PANEL
ALK PHOS: 50 U/L (ref 39–117)
ALT: 17 U/L (ref 0–35)
AST: 16 U/L (ref 0–37)
Albumin: 3.5 g/dL (ref 3.5–5.2)
BILIRUBIN TOTAL: 0.3 mg/dL (ref 0.2–1.2)
Bilirubin, Direct: 0.1 mg/dL (ref 0.0–0.3)
Total Protein: 6.6 g/dL (ref 6.0–8.3)

## 2014-05-10 LAB — BASIC METABOLIC PANEL
BUN: 16 mg/dL (ref 6–23)
CO2: 30 mEq/L (ref 19–32)
CREATININE: 1 mg/dL (ref 0.4–1.2)
Calcium: 8.9 mg/dL (ref 8.4–10.5)
Chloride: 106 mEq/L (ref 96–112)
GFR: 55.72 mL/min — ABNORMAL LOW (ref >60.00)
Glucose, Bld: 91 mg/dL (ref 70–99)
Potassium: 3.7 mEq/L (ref 3.5–5.1)
SODIUM: 142 meq/L (ref 135–145)

## 2014-05-10 LAB — TSH: TSH: 2.32 u[IU]/mL (ref 0.35–4.50)

## 2014-05-10 LAB — POCT INR: INR: 2.5

## 2014-05-10 MED ORDER — ATORVASTATIN CALCIUM 10 MG PO TABS: 10.0000 mg | ORAL_TABLET | Freq: Every day | ORAL | Status: DC

## 2014-05-10 NOTE — Assessment & Plan Note (Signed)

## 2014-05-10 NOTE — Assessment & Plan Note (Signed)
Declines add zoloft

## 2014-05-10 NOTE — Patient Instructions (Signed)
You had the new Prevnar pneumonia shot  Please continue all other medications as before, and refills have been done if requested. Please have the pharmacy call with any other refills you may need.  Please continue your efforts at being more active, low cholesterol diet, and weight control.  You are otherwise up to date with prevention measures today.  Please go to the LAB in the Basement (turn left off the elevator) for the tests to be done today, including the B12 level  You will be contacted by phone if any changes need to be made immediately.  Otherwise, you will receive a letter about your results with an explanation, but please check with MyChart first.  Please remember to sign up for MyChart if you have not done so, as this will be important to you in the future with finding out test results, communicating by private email, and scheduling acute appointments online when needed.  Please return in 6 months, or sooner if needed

## 2014-05-10 NOTE — Addendum Note (Signed)
Addended by: Sharon Seller B on: 05/10/2014 02:21 PM   Modules accepted: Orders

## 2014-05-10 NOTE — Progress Notes (Signed)
Subjective:    Patient ID: Briana Hartman, female    DOB: 1934-04-19, 80 y.o.   MRN: [id number]  HPI  Here for wellness and f/u;  Overall doing ok;  Pt denies CP, worsening SOB, DOE, wheezing, orthopnea, PND, worsening LE edema, palpitations, dizziness or syncope.  Pt denies neurological change such as new headache, facial or extremity weakness.  Pt denies polydipsia, polyuria, or low sugar symptoms. Pt states overall good compliance with treatment and medications, good tolerability, and has been trying to follow lower cholesterol diet.  Pt states good ability with ADL's, has low fall risk, home safety reviewed and adequate, no other significant changes in hearing or vision, and only occasionally active with exercise.  Does c/o ongoing fatigue, but denies signficant daytime hypersomnolence., actually feels better later in the day.  Denies worsening depressive symptoms, suicidal ideation, or panic; has ongoing anxiety, feels "shaky on the time."  Had some sort of reaction to celexa she cannot recall.  Decelins zoloft or other trial today.  Also with night sweats, but  Pt denies fever, wt loss, loss of appetite, or other constitutional symptoms.  Has ongoing painful neuropathy, and Pt continues to have recurring LBP without change in severity, bowel or bladder change, fever, wt loss,  worsening LE pain/numbness/weakness, gait change or falls. Not interested in more medications.  Past Medical History  Diagnosis Date  . Adenomatous colon polyp   . Irritable bowel syndrome   . MITRAL VALVE PROLAPSE   . 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 VENTRAL HERNIA   . Arthritis   . Skin cancer   . COPD (chronic obstructive pulmonary disease)   . Diabetes mellitus   . ACID REFLUX DISEASE   . Hyperlipidemia   . HYPERTENSION   . HYPOTHYROIDISM   . Lumbar spinal stenosis   . Peripheral neuropathy   . Anxiety   . Asthma   . Anemia   . Long term current use of anticoagulant   .  Cervical radiculopathy    Past Surgical History  Procedure Laterality Date  . Appendectomy    . Cesarean section    . Cataract surgery    . Cholecystectomy    . Varicose vein surgery    . Abdominal hysterectomy    . Colonoscopy  05/24/2004    interal hemorrhoids, diverticulosis  . Upper gastrointestinal endoscopy  12/31/2007    gastric polyp    reports that she has never smoked. She does not have any smokeless tobacco history on file. She reports that she does not drink alcohol or use illicit drugs. family history includes Breast cancer in her sister; Clotting disorder in her mother; Diabetes in her brother and sister; Heart disease in her mother and sister. Allergies  Allergen Reactions  . Ampicillin   . Aspirin   . Caffeine   . Codeine   . Lipitor [Atorvastatin] Other (See Comments)    Headache, numbness  . Penicillins    Current Outpatient Prescriptions on File Prior to Visit  Medication Sig Dispense Refill  . furosemide (LASIX) 40 MG tablet Take 1 tablet (40 mg total) by mouth 2 (two) times daily.  60 tablet  11  . levothyroxine (SYNTHROID, LEVOTHROID) 25 MCG tablet TAKE ONE TABLET BY MOUTH ONCE DAILY  30 tablet  6  . LORazepam (ATIVAN) 1 MG tablet TAKE ONE TABLET BY MOUTH TWICE DAILY AS NEEDED FOR ANXIETY  60 tablet  1  . losartan (COZAAR) 100 MG tablet TAKE  1 TABLET BY MOUTH EVERY DAY  30 tablet  11  . metoprolol (LOPRESSOR) 50 MG tablet TAKE ONE TABLET BY MOUTH TWICE DAILY  180 tablet  3  . nitroGLYCERIN (NITROSTAT) 0.4 MG SL tablet Place 0.4 mg under the tongue every 5 (five) minutes as needed.        Marland Kitchen omeprazole (PRILOSEC) 20 MG capsule Take 20 mg by mouth daily. As needed      . warfarin (COUMADIN) 6 MG tablet Take as directed by anticoagulation clinic  35 tablet  3   No current facility-administered medications on file prior to visit.    Review of Systems Constitutional: Negative for increased diaphoresis, other activity, appetite or other siginficant weight change    HENT: Negative for worsening hearing loss, ear pain, facial swelling, mouth sores and neck stiffness.   Eyes: Negative for other worsening pain, redness or visual disturbance.  Respiratory: Negative for shortness of breath and wheezing.   Cardiovascular: Negative for chest pain and palpitations.  Gastrointestinal: Negative for diarrhea, blood in stool, abdominal distention or other pain Genitourinary: Negative for hematuria, flank pain or change in urine volume.  Musculoskeletal: Negative for myalgias or other joint complaints.  Skin: Negative for color change and wound.  Neurological: Negative for syncope and numbness. other than noted Hematological: Negative for adenopathy. or other swelling Psychiatric/Behavioral: Negative for hallucinations, self-injury, decreased concentration or other worsening agitation.      Objective:   Physical Exam BP 122/70  Pulse 77  Temp(Src) 97.9 F (36.6 C) (Oral)  Ht 5\' 8"  (1.727 m)  Wt 181 lb 8 oz (82.328 kg)  BMI 27.60 kg/m2  SpO2 94% VS noted,  Constitutional: Pt is oriented to person, place, and time. Appears well-developed and well-nourished.  Head: Normocephalic and atraumatic.  Right Ear: External ear normal.  Left Ear: External ear normal.  Nose: Nose normal.  Mouth/Throat: Oropharynx is clear and moist.  Eyes: Conjunctivae and EOM are normal. Pupils are equal, round, and reactive to light.  Neck: Normal range of motion. Neck supple. No JVD present. No tracheal deviation present.  Cardiovascular: Normal rate, regular rhythm, normal heart sounds and intact distal pulses.   Pulmonary/Chest: Effort normal and breath sounds without rales or wheezing  Abdominal: Soft. Bowel sounds are normal. NT. No HSM  Musculoskeletal: Normal range of motion. Exhibits no edema.  Lymphadenopathy:  Has no cervical adenopathy.  Neurological: Pt is alert and oriented to person, place, and time. Pt has normal reflexes. No cranial nerve deficit. Motor grossly  intact Skin: Skin is warm and dry. No rash noted.  Psychiatric:  Has 1-2+ nervous mood and affect. Behavior is normal.     Assessment & Plan:

## 2014-05-10 NOTE — Progress Notes (Signed)
Pre visit review using our clinic review tool, if applicable. No additional management support is needed unless otherwise documented below in the visit note. 

## 2014-05-16 ENCOUNTER — Other Ambulatory Visit: Payer: Self-pay | Admitting: Internal Medicine

## 2014-05-16 NOTE — Telephone Encounter (Signed)
Faxed hardcopy to [facility] [place] 

## 2014-05-16 NOTE — Telephone Encounter (Signed)
Done hardcopy to robin  

## 2014-06-21 ENCOUNTER — Ambulatory Visit (INDEPENDENT_AMBULATORY_CARE_PROVIDER_SITE_OTHER): Payer: Managed Care, Other (non HMO) | Admitting: General Practice

## 2014-06-21 DIAGNOSIS — Z5181 Encounter for therapeutic drug level monitoring: Secondary | ICD-10-CM

## 2014-06-21 DIAGNOSIS — I82409 Acute embolism and thrombosis of unspecified deep veins of unspecified lower extremity: Secondary | ICD-10-CM

## 2014-06-21 DIAGNOSIS — I2699 Other pulmonary embolism without acute cor pulmonale: Secondary | ICD-10-CM

## 2014-06-21 DIAGNOSIS — Z7901 Long term (current) use of anticoagulants: Secondary | ICD-10-CM

## 2014-06-21 LAB — POCT INR: INR: 3.2

## 2014-06-21 NOTE — Progress Notes (Signed)
Pre visit review using our clinic review tool, if applicable. No additional management support is needed unless otherwise documented below in the visit note. 

## 2014-07-04 ENCOUNTER — Other Ambulatory Visit: Payer: Self-pay

## 2014-07-04 MED ORDER — LOSARTAN POTASSIUM 100 MG PO TABS: 100.0000 mg | ORAL_TABLET | Freq: Every day | ORAL | Status: DC

## 2014-07-11 ENCOUNTER — Other Ambulatory Visit: Payer: Self-pay | Admitting: Internal Medicine

## 2014-07-12 ENCOUNTER — Other Ambulatory Visit: Payer: Self-pay | Admitting: General Practice

## 2014-07-12 MED ORDER — WARFARIN SODIUM 6 MG PO TABS: ORAL_TABLET | ORAL | Status: DC

## 2014-07-25 ENCOUNTER — Telehealth: Payer: Self-pay | Admitting: Family Medicine

## 2014-07-25 ENCOUNTER — Ambulatory Visit (INDEPENDENT_AMBULATORY_CARE_PROVIDER_SITE_OTHER): Payer: Medicare HMO | Admitting: Family Medicine

## 2014-07-25 DIAGNOSIS — I2699 Other pulmonary embolism without acute cor pulmonale: Secondary | ICD-10-CM

## 2014-07-25 DIAGNOSIS — Z7901 Long term (current) use of anticoagulants: Secondary | ICD-10-CM

## 2014-07-25 DIAGNOSIS — Z5181 Encounter for therapeutic drug level monitoring: Secondary | ICD-10-CM

## 2014-07-25 DIAGNOSIS — I82409 Acute embolism and thrombosis of unspecified deep veins of unspecified lower extremity: Secondary | ICD-10-CM

## 2014-07-25 LAB — POCT INR: INR: 4.2

## 2014-07-25 NOTE — Telephone Encounter (Signed)
Ok to continue to follow BP for now, I suspect the increase was a reaction to her symptoms, which I presume have now resolved.

## 2014-07-25 NOTE — Telephone Encounter (Signed)
Pt was here for INR check.  She said that she woke up yesterday, her lips were tingling and as she started to walk around the house, her whole body started to tingle.  BP results at home 206/104.  She continued to take it throughout the day with results ranging from 013'H-438'O systolic over 87'N-79'J diastolic.  BP today at visit was 148/82.

## 2014-07-26 NOTE — Telephone Encounter (Signed)
Patient informed of MD instructions. 

## 2014-08-09 ENCOUNTER — Other Ambulatory Visit: Payer: Self-pay | Admitting: Internal Medicine

## 2014-08-09 NOTE — Telephone Encounter (Signed)
Done hardcopy to robin  

## 2014-08-09 NOTE — Telephone Encounter (Signed)
Faxed hardcopy to [place]

## 2014-08-16 ENCOUNTER — Ambulatory Visit: Payer: Medicare HMO

## 2014-08-22 ENCOUNTER — Ambulatory Visit (INDEPENDENT_AMBULATORY_CARE_PROVIDER_SITE_OTHER): Payer: Medicare HMO | Admitting: *Deleted

## 2014-08-22 DIAGNOSIS — Z5181 Encounter for therapeutic drug level monitoring: Secondary | ICD-10-CM

## 2014-08-22 DIAGNOSIS — Z7901 Long term (current) use of anticoagulants: Secondary | ICD-10-CM

## 2014-08-22 DIAGNOSIS — I82409 Acute embolism and thrombosis of unspecified deep veins of unspecified lower extremity: Secondary | ICD-10-CM

## 2014-08-22 DIAGNOSIS — I2699 Other pulmonary embolism without acute cor pulmonale: Secondary | ICD-10-CM

## 2014-08-22 LAB — POCT INR: INR: 2.6

## 2014-09-27 ENCOUNTER — Other Ambulatory Visit: Payer: Self-pay

## 2014-09-27 MED ORDER — LEVOTHYROXINE SODIUM 25 MCG PO TABS: 25.0000 ug | ORAL_TABLET | Freq: Every day | ORAL | Status: DC

## 2014-09-28 ENCOUNTER — Other Ambulatory Visit: Payer: Self-pay

## 2014-09-28 MED ORDER — LEVOTHYROXINE SODIUM 25 MCG PO TABS: 25.0000 ug | ORAL_TABLET | Freq: Every day | ORAL | Status: DC

## 2014-10-03 ENCOUNTER — Other Ambulatory Visit: Payer: Medicare HMO

## 2014-10-03 ENCOUNTER — Ambulatory Visit: Payer: Medicare HMO

## 2014-10-03 DIAGNOSIS — Z5181 Encounter for therapeutic drug level monitoring: Secondary | ICD-10-CM

## 2014-10-03 DIAGNOSIS — Z23 Encounter for immunization: Secondary | ICD-10-CM

## 2014-10-06 ENCOUNTER — Ambulatory Visit (INDEPENDENT_AMBULATORY_CARE_PROVIDER_SITE_OTHER): Payer: Medicare HMO | Admitting: *Deleted

## 2014-10-06 DIAGNOSIS — Z5181 Encounter for therapeutic drug level monitoring: Secondary | ICD-10-CM

## 2014-10-06 LAB — POCT INR: INR: 2.4

## 2014-11-10 ENCOUNTER — Ambulatory Visit: Payer: Managed Care, Other (non HMO) | Admitting: Internal Medicine

## 2014-11-11 ENCOUNTER — Other Ambulatory Visit: Payer: Self-pay | Admitting: Internal Medicine

## 2014-11-14 NOTE — Telephone Encounter (Signed)
Done hardcopy to robin  

## 2014-11-15 NOTE — Telephone Encounter (Signed)
Faxed hardcopy for Lorazepam to [place]

## 2014-11-21 ENCOUNTER — Telehealth: Payer: Self-pay | Admitting: Internal Medicine

## 2014-11-21 DIAGNOSIS — R1013 Epigastric pain: Principal | ICD-10-CM

## 2014-11-21 DIAGNOSIS — G8929 Other chronic pain: Secondary | ICD-10-CM

## 2014-11-21 DIAGNOSIS — Z800 Family history of malignant neoplasm of digestive organs: Secondary | ICD-10-CM

## 2014-11-21 NOTE — Telephone Encounter (Signed)
Left message for patient to call back Patient is scheduled for 11/28/14 10:00 at [facility]. She will only need one bottle of contrast at 9:00 am

## 2014-11-21 NOTE — Telephone Encounter (Signed)
2 siblings with pancreatic cancer She has some upper abd pain - epigastric  Please contact her and arrange for BUN/creat and CT abdomen to evaluate epigastric pain and FHx pancreatic cancer

## 2014-11-21 NOTE — Telephone Encounter (Signed)
Patient notified I have helped her to reschedule for 11/28/14 at 1:00.  New instructions placed out front.  She verbalized understanding to come for lab work prior to the CT and to pick up her contrast and instructions.

## 2014-11-23 ENCOUNTER — Other Ambulatory Visit (INDEPENDENT_AMBULATORY_CARE_PROVIDER_SITE_OTHER): Payer: Medicare HMO

## 2014-11-23 DIAGNOSIS — Z800 Family history of malignant neoplasm of digestive organs: Secondary | ICD-10-CM

## 2014-11-23 DIAGNOSIS — G8929 Other chronic pain: Secondary | ICD-10-CM

## 2014-11-23 DIAGNOSIS — R1013 Epigastric pain: Secondary | ICD-10-CM

## 2014-11-23 LAB — BUN: BUN: 17 mg/dL (ref 6–23)

## 2014-11-23 LAB — CREATININE, SERUM: CREATININE: 0.9 mg/dL (ref 0.4–1.2)

## 2014-11-27 ENCOUNTER — Telehealth: Payer: Self-pay | Admitting: Internal Medicine

## 2014-11-27 NOTE — Telephone Encounter (Signed)
Patient is scheduled for CT abdomen tomorrow.  She is advised that the CT will look at the lower part of her lungs and abdomen.  She is advised based on the results other testing will be ordered, or will set her up for an office visit.  She verbalized understanding.

## 2014-11-28 ENCOUNTER — Ambulatory Visit (INDEPENDENT_AMBULATORY_CARE_PROVIDER_SITE_OTHER)
Admission: RE | Admit: 2014-11-28 | Discharge: 2014-11-28 | Disposition: A | Discharge status: Home / Self Care | Payer: Medicare HMO | Source: Ambulatory Visit | Attending: Internal Medicine | Admitting: Internal Medicine

## 2014-11-28 DIAGNOSIS — Z800 Family history of malignant neoplasm of digestive organs: Secondary | ICD-10-CM

## 2014-11-28 DIAGNOSIS — R1013 Epigastric pain: Secondary | ICD-10-CM

## 2014-11-28 DIAGNOSIS — G8929 Other chronic pain: Secondary | ICD-10-CM

## 2014-11-28 MED ORDER — IOHEXOL 300 MG/ML  SOLN: 100.0000 mL | Freq: Once | INTRAMUSCULAR | Status: AC | PRN

## 2014-11-28 NOTE — Progress Notes (Signed)
Quick Note:  Let her know overall ok - especially pancreas is NL F/U me prn ______

## 2015-01-22 ENCOUNTER — Ambulatory Visit (INDEPENDENT_AMBULATORY_CARE_PROVIDER_SITE_OTHER): Payer: Medicare HMO | Admitting: Internal Medicine

## 2015-01-22 ENCOUNTER — Encounter: Payer: Self-pay | Admitting: Internal Medicine

## 2015-01-22 VITALS — BP 114/64 | HR 72 | Ht 66.0 in | Wt 189.2 lb

## 2015-01-22 DIAGNOSIS — K439 Ventral hernia without obstruction or gangrene: Secondary | ICD-10-CM

## 2015-01-22 DIAGNOSIS — K219 Gastro-esophageal reflux disease without esophagitis: Secondary | ICD-10-CM

## 2015-01-22 NOTE — Patient Instructions (Signed)
You have been scheduled for an endoscopy. Please follow written instructions given to you at your visit today. If you use inhalers (even only as needed), please bring them with you on the day of your procedure.  STAY ON YOUR WARFARIN PER Dr. Carlean Purl.  I appreciate the opportunity to care for you. Silvano Rusk, M.D., [facility]

## 2015-01-22 NOTE — Progress Notes (Signed)
Subjective:    Patient ID: Briana Hartman, female    DOB: 22-Aug-1934, 81 y.o.   MRN: [id number]  HPI   81 y/o female presents with epigastric pain and regurgitation x 3 months.  States that she has a hx of GERD but hasn't had symptoms for a while until about 3 months ago.  Feels she has reflux especially when she bends over to do something. Complains of early satiety and feeling bloated after she eats.  Also complains of postprandial nausea and states sometimes she feels like liquids more so than solids get stuck in the middle of her throat.  Patients stopped taking Prilosec about 2 years ago on her own as she states her GERD was under-control. Epigastric pain has also been getting worse in the last three months and described it as a burning, aching feeling.  Patient has a history of a ventral hernia which has been bulging lately, especially when she coughs.  States she recently got over a "cold" and had been cough a lot.   Denies diarrhea, constipation, blood in stool, hemoptysis, unintentional weight loss.  CT scan abd/pelvis with contrast ordered last month was negative - she was concerned about relatives with pancreatic cancer and her own risk given abdominal sxs - she had contacted [place] before this visit.  Allergies  Allergen Reactions  . Ampicillin   . Aspirin   . Caffeine   . Codeine   . Lipitor [Atorvastatin] Other (See Comments)    Headache, numbness  . Penicillins    Outpatient Prescriptions Prior to Visit  Medication Sig Dispense Refill  . atorvastatin (LIPITOR) 10 MG tablet Take 1 tablet (10 mg total) by mouth daily. 90 tablet 3  . furosemide (LASIX) 40 MG tablet Take 1 tablet (40 mg total) by mouth 2 (two) times daily. 60 tablet 11  . levothyroxine (SYNTHROID, LEVOTHROID) 25 MCG tablet Take 1 tablet (25 mcg total) by mouth daily. 30 tablet 11  . LORazepam (ATIVAN) 1 MG tablet TAKE ONE TABLET BY MOUTH TWICE DAILY AS NEEDED FOR ANXIETY 60 tablet 2  . losartan (COZAAR) 100 MG  tablet Take 1 tablet (100 mg total) by mouth daily. 90 tablet 3  . metoprolol (LOPRESSOR) 50 MG tablet TAKE ONE TABLET BY MOUTH TWICE DAILY 180 tablet 3  . nitroGLYCERIN (NITROSTAT) 0.4 MG SL tablet Place 0.4 mg under the tongue every 5 (five) minutes as needed.      . warfarin (COUMADIN) 6 MG tablet Take as directed by anticoagulation clinic 35 tablet 3  . omeprazole (PRILOSEC) 20 MG capsule Take 20 mg by mouth daily. As needed     No facility-administered medications prior to visit.   Past Medical History  Diagnosis Date  . Adenomatous colon polyp   . Irritable bowel syndrome   . MITRAL VALVE PROLAPSE   . 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 VENTRAL HERNIA   . Arthritis   . Skin cancer   . COPD (chronic obstructive pulmonary disease)   . Diabetes mellitus   . ACID REFLUX DISEASE   . Hyperlipidemia   . HYPERTENSION   . HYPOTHYROIDISM   . Lumbar spinal stenosis   . Peripheral neuropathy   . Anxiety   . Asthma   . Anemia   . Long term current use of anticoagulant   . Cervical radiculopathy   . Enlarged heart     born with   Past Surgical History  Procedure Laterality Date  . Appendectomy    .  Cesarean section    . Cataract extraction Right   . Cholecystectomy    . Varicose vein surgery Right   . Abdominal hysterectomy    . Colonoscopy  05/24/2004    interal hemorrhoids, diverticulosis  . Upper gastrointestinal endoscopy  12/31/2007    gastric polyp   History   Social History  . Marital Status: Married    Spouse Name: N/A    Number of Children: 3  . Years of Education: N/A   Occupational History  . baker at [employer]     Social History Main Topics  . Smoking status: Never Smoker   . Smokeless tobacco: Never Used  . Alcohol Use: No  . Drug Use: No  . Sexual Activity: None   Other Topics Concern  . None   Social History Narrative   Family History  Problem Relation Age of Onset  . Heart disease Mother   . Clotting disorder Mother   .  Breast cancer Sister   . Diabetes Sister     x 3  . Heart disease Sister   . Diabetes Brother     x 3  . Pancreatic cancer Brother   . Cancer Brother     lower bowel  . Pancreatic disease Sister   . Liver cancer Sister      Review of Systems  Constitutional: Negative for fatigue and unexpected weight change.  HENT: Negative for voice change.   Respiratory: Positive for cough. Negative for shortness of breath.   Cardiovascular: Negative for chest pain.  Gastrointestinal: Positive for nausea, abdominal pain and abdominal distention. Negative for diarrhea, constipation and blood in stool.  Genitourinary: Negative for dysuria and difficulty urinating.  Neurological: Negative for dizziness and light-headedness.       Objective:   Physical Exam  General: Well developed, well nourished, appears in no apparent distress HEENT: Anicteic Sclera. No pharyngeal erythema or exudates  Neck: Supple, no JVD, no masses  Cardiovascular: RRR, S1 S2 auscultated, no rubs, murmurs or gallops.  Respiratory: Clear to auscultation bilaterally with equal chest rise  Abdomen: Tender to palpation in epigastric region particularly over the ventral hernia, + BS, no masses.; No guarding or peritoneal signs Extremities: warm dry without cyanosis clubbing. Neuro: AAOx3, cranial nerves grossly intact.  Skin: Without rashes exudates or nodules.  Psych: Normal affect and demeanor with intact judgement and insight      Assessment & Plan:   Gastroesophageal reflux disease, esophagitis presence not specified -Scheduled for Endoscopy.   -Patient is on Coumadin.  Will not stop Coumadin for the procedure as last time Coumadin was stopped for 3 days, patient developed a clot in her leg.  Ventral hernia without obstruction or gangrene -most likely aggravated by her coughing.  Instructed to try to hold her hand over hernia while coughing.  Donald Siva Karolee Stamps 01/22/2015  I have  personally seen the patient, reviewed and repeated key elements of the history and physical and participated in formation of the assessment and plan the student has documented.  She has a hx of gastric polyps also so EGd to f/u these and to assess mucosa off PPI reasonable. Most of her reflux seems to occur with bending. ? Needs abdominal hernia binder?  The risks and benefits as well as alternatives of endoscopic procedure(s) have been discussed and reviewed. All questions answered. The patient agrees to proceed.

## 2015-02-09 ENCOUNTER — Encounter: Payer: Self-pay | Admitting: Internal Medicine

## 2015-02-22 ENCOUNTER — Ambulatory Visit (INDEPENDENT_AMBULATORY_CARE_PROVIDER_SITE_OTHER): Payer: Medicare HMO | Admitting: General Practice

## 2015-02-22 DIAGNOSIS — Z5181 Encounter for therapeutic drug level monitoring: Secondary | ICD-10-CM

## 2015-03-02 ENCOUNTER — Ambulatory Visit (AMBULATORY_SURGERY_CENTER): Payer: Medicare HMO | Admitting: Internal Medicine

## 2015-03-02 ENCOUNTER — Encounter: Payer: Self-pay | Admitting: Internal Medicine

## 2015-03-02 ENCOUNTER — Encounter: Payer: Medicare HMO | Admitting: Internal Medicine

## 2015-03-02 VITALS — BP 147/77 | HR 62 | Temp 97.0°F | Resp 19 | Ht 66.0 in | Wt 189.0 lb

## 2015-03-02 DIAGNOSIS — K219 Gastro-esophageal reflux disease without esophagitis: Secondary | ICD-10-CM

## 2015-03-02 DIAGNOSIS — K317 Polyp of stomach and duodenum: Secondary | ICD-10-CM

## 2015-03-02 DIAGNOSIS — K228 Other specified diseases of esophagus: Secondary | ICD-10-CM | POA: Diagnosis not present

## 2015-03-02 MED ORDER — ABDOMINAL BINDER/ELASTIC XL MISC: 1.0000 | Status: AC | PRN

## 2015-03-02 MED ORDER — SODIUM CHLORIDE 0.9 % IV SOLN: 500.0000 mL | INTRAVENOUS | Status: DC

## 2015-03-02 NOTE — Progress Notes (Signed)
Report to PACU, RN, vss, BBS= Clear.  

## 2015-03-02 NOTE — Patient Instructions (Addendum)
There were some polyps in the stomach. I took biopsies and will call with results. I have written a prescription for an abdominal binder to help your hernia pain.  I appreciate the opportunity to care for you. Gatha Mayer, MD, FACG  YOU HAD AN ENDOSCOPIC PROCEDURE TODAY AT [facility]:   Refer to the procedure report that was given to you for any specific questions about what was found during the examination.  If the procedure report does not answer your questions, please call your gastroenterologist to clarify.  If you requested that your care partner not be given the details of your procedure findings, then the procedure report has been included in a sealed envelope for you to review at your convenience later.  YOU SHOULD EXPECT: Some feelings of bloating in the abdomen. Passage of more gas than usual.  Walking can help get rid of the air that was put into your GI tract during the procedure and reduce the bloating. If you had a lower endoscopy (such as a colonoscopy or flexible sigmoidoscopy) you may notice spotting of blood in your stool or on the toilet paper. If you underwent a bowel prep for your procedure, you may not have a normal bowel movement for a few days.  Please Note:  You might notice some irritation and congestion in your nose or some drainage.  This is from the oxygen used during your procedure.  There is no need for concern and it should clear up in a day or so.  SYMPTOMS TO REPORT IMMEDIATELY:   Following lower endoscopy (colonoscopy or flexible sigmoidoscopy):  Excessive amounts of blood in the stool  Significant tenderness or worsening of abdominal pains  Swelling of the abdomen that is new, acute  Fever of 100F or higher    For urgent or emergent issues, a gastroenterologist can be reached at any hour by calling [phone].   DIET: Your first meal following the procedure should be a small meal and then it is ok to progress to your  normal diet. Heavy or fried foods are harder to digest and may make you feel nauseous or bloated.  Likewise, meals heavy in dairy and vegetables can increase bloating.  Drink plenty of fluids but you should avoid alcoholic beverages for 24 hours.  ACTIVITY:  You should plan to take it easy for the rest of today and you should NOT DRIVE or use heavy machinery until tomorrow (because of the sedation medicines used during the test).    FOLLOW UP: Our staff will call the number listed on your records the next business day following your procedure to check on you and address any questions or concerns that you may have regarding the information given to you following your procedure. If we do not reach you, we will leave a message.  However, if you are feeling well and you are not experiencing any problems, there is no need to return our call.  We will assume that you have returned to your regular daily activities without incident.  If any biopsies were taken you will be contacted by phone or by letter within the next 1-3 weeks.  Please call us at [phone] if you have not heard about the biopsies in 3 weeks.    SIGNATURES/CONFIDENTIALITY: You and/or your care partner have signed paperwork which will be entered into your electronic medical record.  These signatures attest to the fact that that the information above on your After Visit Summary has  been reviewed and is understood.  Full responsibility of the confidentiality of this discharge information lies with you and/or your care-partner.   YOU HAD AN ENDOSCOPIC PROCEDURE TODAY AT [facility]:   Refer to the procedure report that was given to you for any specific questions about what was found during the examination.  If the procedure report does not answer your questions, please call your gastroenterologist to clarify.  If you requested that your care partner not be given the details of your procedure findings, then the procedure  report has been included in a sealed envelope for you to review at your convenience later.  YOU SHOULD EXPECT: Some feelings of bloating in the abdomen. Passage of more gas than usual.  Walking can help get rid of the air that was put into your GI tract during the procedure and reduce the bloating. If you had a lower endoscopy (such as a colonoscopy or flexible sigmoidoscopy) you may notice spotting of blood in your stool or on the toilet paper. If you underwent a bowel prep for your procedure, you may not have a normal bowel movement for a few days.  Please Note:  You might notice some irritation and congestion in your nose or some drainage.  This is from the oxygen used during your procedure.  There is no need for concern and it should clear up in a day or so.  SYMPTOMS TO REPORT IMMEDIATELY:   Following lower endoscopy (colonoscopy or flexible sigmoidoscopy):  Excessive amounts of blood in the stool  Significant tenderness or worsening of abdominal pains  Swelling of the abdomen that is new, acute  Fever of 100F or higher   Following upper endoscopy (EGD)  Vomiting of blood or coffee ground material  New chest pain or pain under the shoulder blades  Painful or persistently difficult swallowing  New shortness of breath  Fever of 100F or higher  Black, tarry-looking stools  For urgent or emergent issues, a gastroenterologist can be reached at any hour by calling [phone].   DIET: Your first meal following the procedure should be a small meal and then it is ok to progress to your normal diet. Heavy or fried foods are harder to digest and may make you feel nauseous or bloated.  Likewise, meals heavy in dairy and vegetables can increase bloating.  Drink plenty of fluids but you should avoid alcoholic beverages for 24 hours.  ACTIVITY:  You should plan to take it easy for the rest of today and you should NOT DRIVE or use heavy machinery until tomorrow (because of the sedation  medicines used during the test).    FOLLOW UP: Our staff will call the number listed on your records the next business day following your procedure to check on you and address any questions or concerns that you may have regarding the information given to you following your procedure. If we do not reach you, we will leave a message.  However, if you are feeling well and you are not experiencing any problems, there is no need to return our call.  We will assume that you have returned to your regular daily activities without incident.  If any biopsies were taken you will be contacted by phone or by letter within the next 1-3 weeks.  Please call us at [phone] if you have not heard about the biopsies in 3 weeks.    SIGNATURES/CONFIDENTIALITY: You and/or your care partner have signed paperwork which will be entered into  your electronic medical record.  These signatures attest to the fact that that the information above on your After Visit Summary has been reviewed and is understood.  Full responsibility of the confidentiality of this discharge information lies with you and/or your care-partner.

## 2015-03-02 NOTE — Progress Notes (Signed)
Pt to continue anti-coagulant med today as pre procedure she is aware of this.

## 2015-03-02 NOTE — Op Note (Signed)
[facility]  [address] [place], [address]   ENDOSCOPY PROCEDURE REPORT  PATIENT: Briana, Hartman  MR#: [id number] BIRTHDATE: February 29, 1934 , 80  yrs. old GENDER: female ENDOSCOPIST: Gatha Mayer, MD, [facility] PROCEDURE DATE:  03/02/2015 PROCEDURE:  EGD w/ biopsy ASA CLASS:     Class III INDICATIONS:  reflux despite PPI. MEDICATIONS: Propofol 100 mg IV and Lidocaine 150 mg IV TOPICAL ANESTHETIC: none  DESCRIPTION OF PROCEDURE: After the risks benefits and alternatives of the procedure were thoroughly explained, informed consent was obtained.  The LB [id number] endoscope was introduced through the mouth and advanced to the second portion of the duodenum , Without limitations.  The instrument was slowly withdrawn as the mucosa was fully examined.    1) 1 cm polyp at GE junction - biopsies taken - ? neoplasia or inflammatory 2) Multiple diminutve polyps in fundus and body - biopsies of some taken - look like fundic gland polyps 3) otherwise normal egd.  Retroflexed views revealed no abnormalities.     The scope was then withdrawn from the patient and the procedure completed.  COMPLICATIONS: There were no immediate complications.  ENDOSCOPIC IMPRESSION: 1) 1 cm polyp at GE junction - biopsies taken - ? neoplasia or inflammatory 2) Multiple diminutve polyps in fundus and body - biopsies of some taken - look like fundic gland polyps 3) otherwise normal egd  RECOMMENDATIONS: 1.  Office will call with results 2.  Abdominal binder for hernia  REPEAT EXAM:  eSigned:  Gatha Mayer, MD, [facility] 03/02/2015 8:02 AM    CC: The Patient and Dr. Hilbert Corrigan

## 2015-03-02 NOTE — Progress Notes (Signed)
Called to room to assist during endoscopic procedure.  Patient ID and intended procedure confirmed with present staff. Received instructions for my participation in the procedure from the performing physician.  

## 2015-03-05 ENCOUNTER — Telehealth: Payer: Self-pay | Admitting: *Deleted

## 2015-03-05 NOTE — Telephone Encounter (Signed)
  Follow up Call-  Call back number 03/02/2015  Post procedure Call Back phone  # [phone]  Permission to leave phone message Yes     Patient questions:  Do you have a fever, pain , or abdominal swelling? No. Pain Score  0 *  Have you tolerated food without any problems? Yes.    Have you been able to return to your normal activities? Yes.    Do you have any questions about your discharge instructions: Diet   No. Medications  No. Follow up visit  No.  Do you have questions or concerns about your Care? No.  Actions: * If pain score is 4 or above: No action needed, pain <4.

## 2015-03-06 ENCOUNTER — Encounter: Payer: Self-pay | Admitting: Internal Medicine

## 2015-03-06 DIAGNOSIS — K317 Polyp of stomach and duodenum: Secondary | ICD-10-CM | POA: Insufficient documentation

## 2015-03-06 DIAGNOSIS — D131 Benign neoplasm of stomach: Secondary | ICD-10-CM

## 2015-03-06 HISTORY — DX: Polyp of stomach and duodenum: K31.7

## 2015-03-06 HISTORY — DX: Benign neoplasm of stomach: D13.1

## 2015-03-06 NOTE — Progress Notes (Signed)
Quick Note:  Please call patient from office - polyps benign and not usually pre-cancerous. Office visit recall 1 year please - we will consider checking the polyps again then.  [facility] - no letter ______

## 2015-03-06 NOTE — Progress Notes (Signed)
Quick Note:  Hyperplastic gastric polyps almost never become cancer and no signs of cancer anywhere - hers is small and I would not remove it do to bleeding issues in her case Fundic gland polyps do not have any cancer potential Any further ? I will call her  ______

## 2015-03-28 IMAGING — CT CT ABDOMEN W/ CM
2 of 5 series · 17 of 46 positions shown, 19 images · IV contrast (Omnipaque 300)
Comparison: 05/14/2009

CLINICAL DATA: Chronic epigastric pain and nausea.

EXAM:
CT ABDOMEN WITH CONTRAST
TECHNIQUE: Multidetector CT imaging of the abdomen was performed using the
standard protocol following bolus administration of intravenous
contrast.
CONTRAST:  100 cc Omnipaque 300

[Series 3: abd/ pel 5mm · axial · 0.70mm/px · z∈[-394,-168]mm · 14 of 52 slices shown, 16 images]
[im 4/52  soft-tissue]
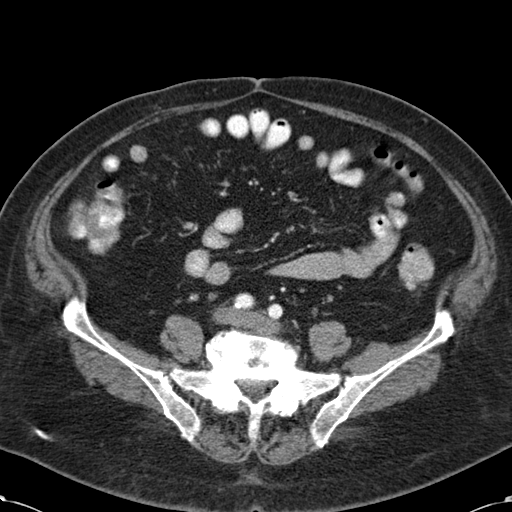
[im 4/52  bone]
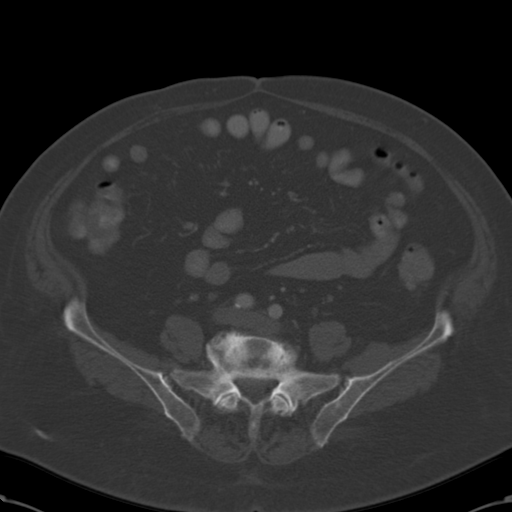
[im 7/52  soft-tissue]
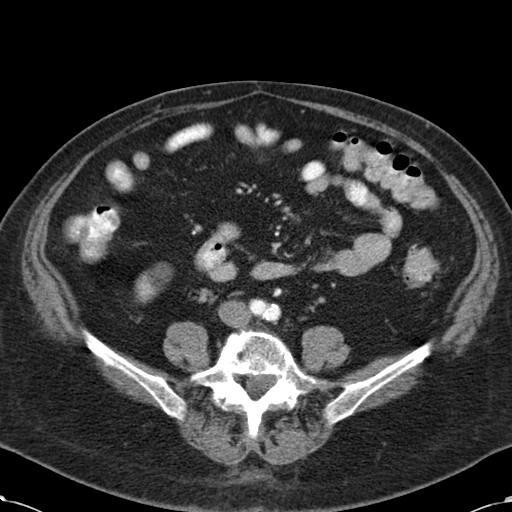
[im 10/52  soft-tissue]
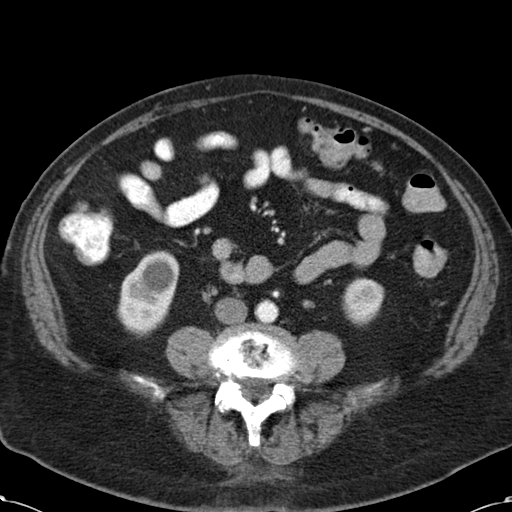
[im 16/52  soft-tissue]
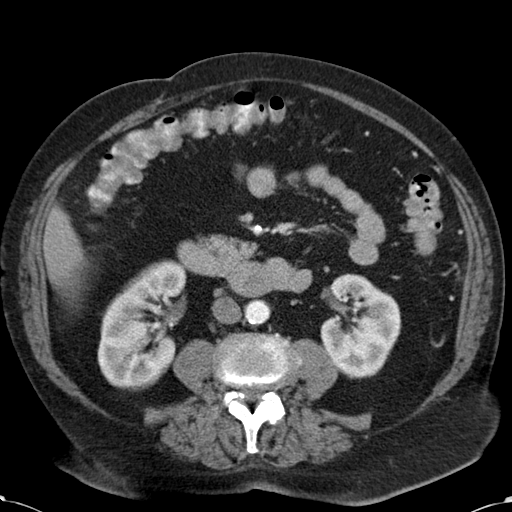
[im 19/52  soft-tissue]
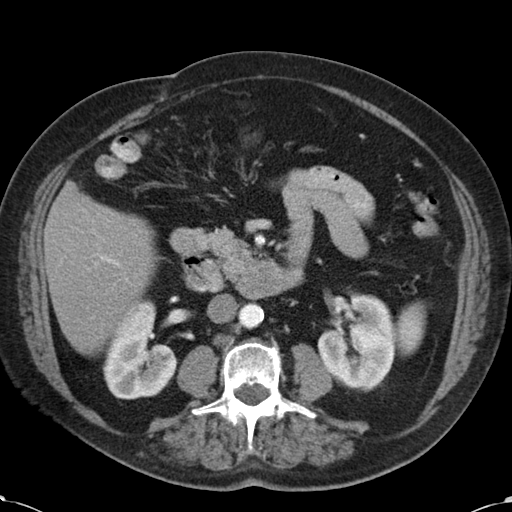
[im 22/52  soft-tissue]
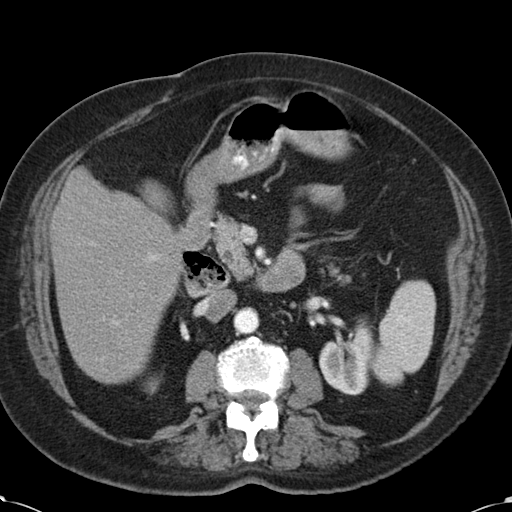
[im 25/52  soft-tissue]
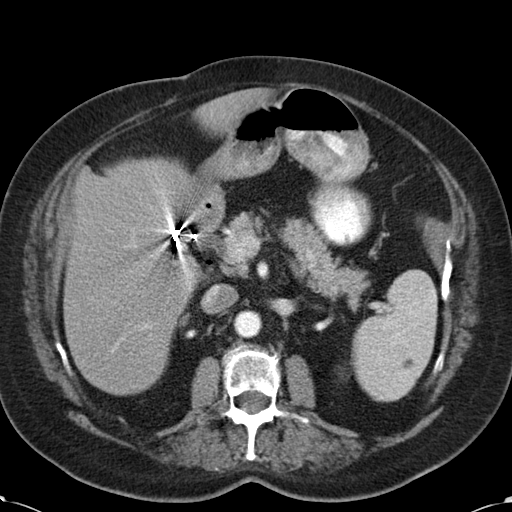
[im 28/52  soft-tissue]
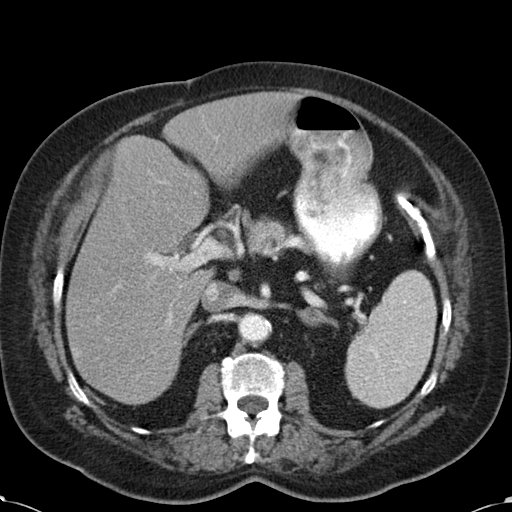
[im 31/52  soft-tissue]
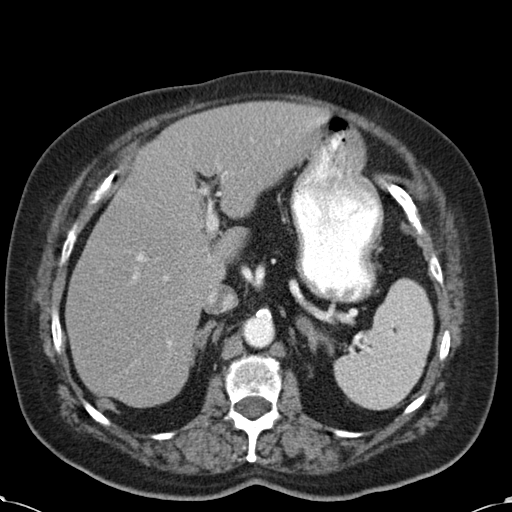
[im 31/52  bone]
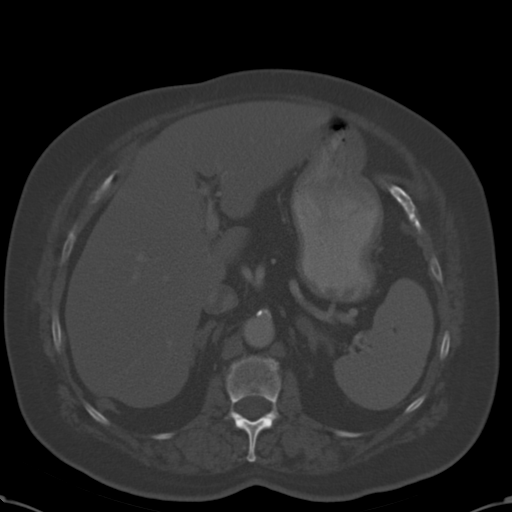
[im 34/52  soft-tissue]
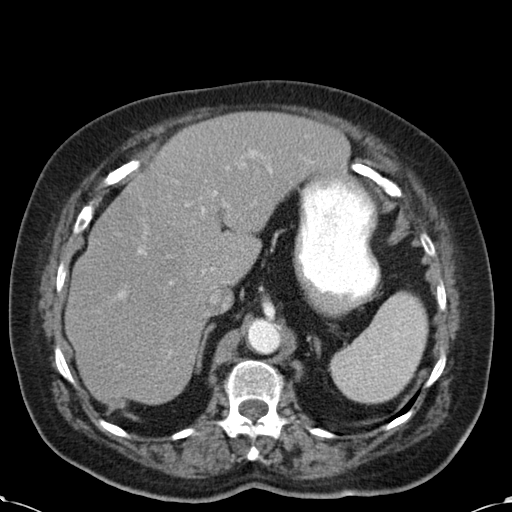
[im 40/52  soft-tissue]
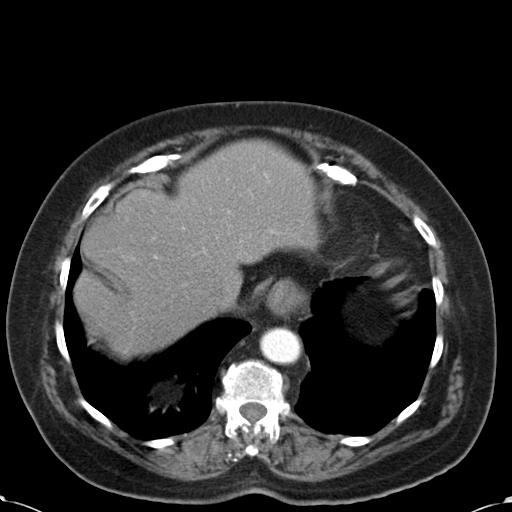
[im 43/52  soft-tissue]
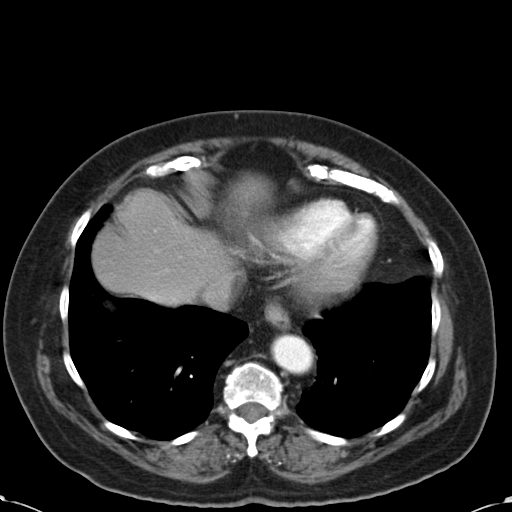
[im 46/52  soft-tissue]
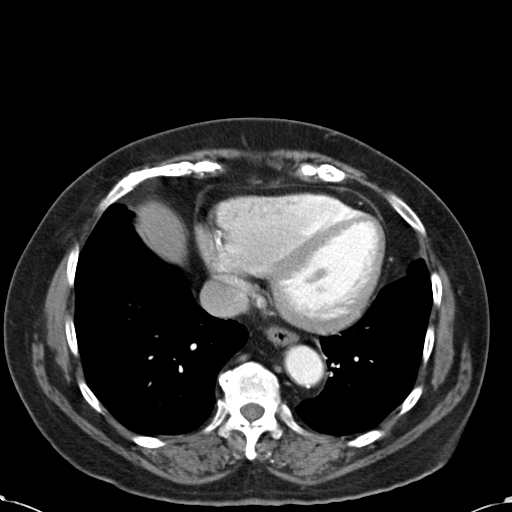
[im 49/52  soft-tissue]
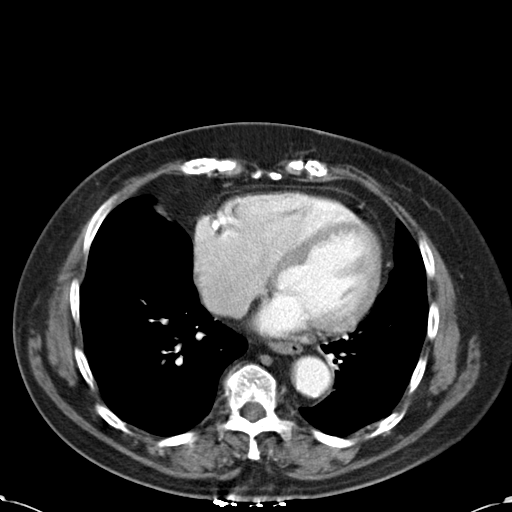

[Series 602: coronal · coronal · 0.70mm/px · 3 of 141 slices shown]
[im 47/141  soft-tissue]
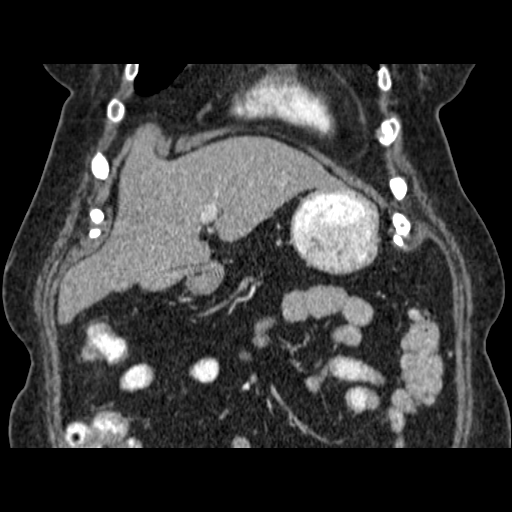
[im 63/141  soft-tissue]
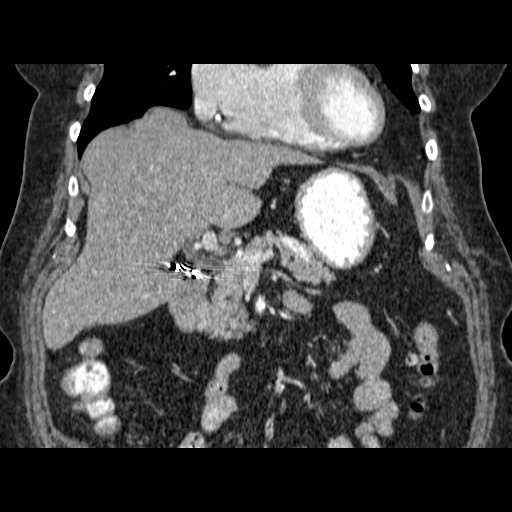
[im 78/141  soft-tissue]
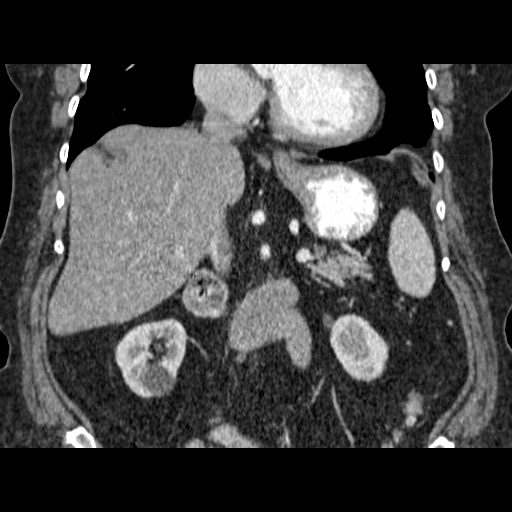

[17 of 46 positions shown; findings below may reference images not displayed]

FINDINGS: Lower chest: The lung bases are clear. No pleural effusion. No
pulmonary lesions. The heart is upper limits of normal in size for
age. Coronary artery calcifications are noted. The distal esophagus
is grossly normal.

Hepatobiliary: Mild diffuse fatty infiltration of the liver. No
focal hepatic lesions or intrahepatic biliary dilatation. A tiny
left hepatic lobe cyst is noted. The gallbladder is surgically
absent. No common bile duct dilatation.

Pancreas: Normal.

Spleen: Small hepatic cysts are noted.  No worrisome lesions.

Adrenals/Urinary Tract: The left adrenal gland demonstrates small
stable nodules consistent with benign adenomas. The right adrenal
gland is normal. Both kidneys are unremarkable. Stable lower pole
right renal cysts. No hydronephrosis or renal calculi. The upper
ureters are normal.

Stomach/Bowel: The stomach, duodenum, visualized small bowel and
visualized colon are unremarkable. The duodenum diverticulum is
noted.

Vascular/Lymphatic: No mesenteric or retroperitoneal mass or
adenopathy. Moderate atherosclerotic calcifications involving the
aorta and branch vessel ostia. No aneurysm or dissection. The major
venous structures are patent.

Musculoskeletal: No significant bony findings. Moderate degenerative
disc disease and facet disease noted in the lumbar spine.
IMPRESSION: No acute abdominal findings, mass lesions or adenopathy.

Stable small left adrenal gland adenomas.

Diffuse fatty infiltration of the liver.

Moderate atherosclerotic calcifications involving the aorta.

Coronary artery calcifications.

## 2015-05-16 NOTE — Progress Notes (Signed)
Patient ID: Briana Hartman, female   DOB: 1934-01-18, 81 y.o.   MRN: [id number]  80 y.o. with HTN.  History of PE in 1995 with presumed hypercoagulable state on chronic coumadin  She indicates recurrent DVT when taken off coumadin   Compliant with meds No chest pain , dyspnea or LE edema INR have been Rx  No embolic events or TIA No bleeding issues.  Distant history of MVP  Last echo 04/27/14 reviewed  EF 55-60%   No prolapse  Trivial MR    ROS: Denies fever, malais, weight loss, blurry vision, decreased visual acuity, cough, sputum, SOB, hemoptysis, pleuritic pain, palpitaitons, heartburn, abdominal pain, melena, lower extremity edema, claudication, or rash.  All other systems reviewed and negative  General: Affect appropriate Healthy:  appears stated age 33: normal Neck supple with no adenopathy JVP normal no bruits no thyromegaly Lungs clear with no wheezing and good diaphragmatic motion Heart:  S1/S2 no murmur, no rub, gallop or click PMI normal Abdomen: benighn, BS positve, no tenderness, no AAA no bruit.  No HSM or HJR Distal pulses intact with no bruits No edema Neuro non-focal Skin warm and dry No muscular weakness   Current Outpatient Prescriptions  Medication Sig Dispense Refill  . amLODipine (NORVASC) 5 MG tablet Take 1 tablet by mouth daily.    Marland Kitchen atorvastatin (LIPITOR) 10 MG tablet Take 1 tablet (10 mg total) by mouth daily. 90 tablet 3  . Elastic Bandages & Supports (ABDOMINAL BINDER/ELASTIC XL) MISC 1 Device by Does not apply route as needed (to prevent abdominal pain from hernia). 1 each 0  . furosemide (LASIX) 40 MG tablet Take 1 tablet (40 mg total) by mouth 2 (two) times daily. 60 tablet 11  . levothyroxine (SYNTHROID, LEVOTHROID) 25 MCG tablet Take 1 tablet (25 mcg total) by mouth daily. 30 tablet 11  . LORazepam (ATIVAN) 1 MG tablet TAKE ONE TABLET BY MOUTH TWICE DAILY AS NEEDED FOR ANXIETY 60 tablet 2  . losartan (COZAAR) 100 MG tablet Take 1 tablet (100  mg total) by mouth daily. 90 tablet 3  . metoprolol (LOPRESSOR) 50 MG tablet TAKE ONE TABLET BY MOUTH TWICE DAILY 180 tablet 3  . mupirocin ointment (BACTROBAN) 2 %     . nitroGLYCERIN (NITROSTAT) 0.4 MG SL tablet Place 0.4 mg under the tongue every 5 (five) minutes as needed.      . warfarin (COUMADIN) 6 MG tablet Take as directed by anticoagulation clinic 35 tablet 3   No current facility-administered medications for this visit.    Allergies  Ampicillin; Aspirin; Caffeine; Codeine; Lipitor; and Penicillins  Electrocardiogram:   04/14/14 SR rate 86 PVC otherwise normal   Assessment and Plan

## 2015-05-18 ENCOUNTER — Encounter: Payer: Medicare HMO | Admitting: Cardiovascular Disease

## 2015-06-26 ENCOUNTER — Other Ambulatory Visit: Payer: Self-pay | Admitting: Internal Medicine

## 2015-07-23 ENCOUNTER — Other Ambulatory Visit: Payer: Self-pay | Admitting: Internal Medicine

## 2015-08-06 ENCOUNTER — Ambulatory Visit: Payer: Medicare HMO | Admitting: Cardiovascular Disease

## 2015-09-20 ENCOUNTER — Ambulatory Visit: Payer: Medicare HMO | Admitting: Cardiovascular Disease

## 2015-10-01 ENCOUNTER — Other Ambulatory Visit: Payer: Self-pay | Admitting: Internal Medicine

## 2015-10-06 ENCOUNTER — Other Ambulatory Visit: Payer: Self-pay | Admitting: Internal Medicine

## 2015-11-02 ENCOUNTER — Ambulatory Visit: Payer: Medicare HMO | Admitting: Cardiovascular Disease

## 2015-11-28 ENCOUNTER — Encounter: Payer: Self-pay | Admitting: Internal Medicine

## 2015-12-26 DIAGNOSIS — J449 Chronic obstructive pulmonary disease, unspecified: Secondary | ICD-10-CM | POA: Insufficient documentation

## 2015-12-26 DIAGNOSIS — M1990 Unspecified osteoarthritis, unspecified site: Secondary | ICD-10-CM | POA: Insufficient documentation

## 2015-12-26 DIAGNOSIS — F419 Anxiety disorder, unspecified: Secondary | ICD-10-CM | POA: Insufficient documentation

## 2015-12-26 DIAGNOSIS — J45909 Unspecified asthma, uncomplicated: Secondary | ICD-10-CM | POA: Insufficient documentation

## 2016-01-03 ENCOUNTER — Encounter: Payer: Self-pay | Admitting: Internal Medicine

## 2016-04-29 DIAGNOSIS — R791 Abnormal coagulation profile: Secondary | ICD-10-CM | POA: Insufficient documentation

## 2016-10-10 DIAGNOSIS — Z23 Encounter for immunization: Secondary | ICD-10-CM | POA: Insufficient documentation

## 2016-10-23 DIAGNOSIS — E1142 Type 2 diabetes mellitus with diabetic polyneuropathy: Secondary | ICD-10-CM | POA: Insufficient documentation

## 2017-03-04 DIAGNOSIS — S98132A Complete traumatic amputation of one left lesser toe, initial encounter: Secondary | ICD-10-CM | POA: Insufficient documentation

## 2018-04-22 DIAGNOSIS — E0500 Thyrotoxicosis with diffuse goiter without thyrotoxic crisis or storm: Secondary | ICD-10-CM | POA: Insufficient documentation

## 2018-04-22 DIAGNOSIS — H43393 Other vitreous opacities, bilateral: Secondary | ICD-10-CM | POA: Insufficient documentation

## 2018-04-22 DIAGNOSIS — H16223 Keratoconjunctivitis sicca, not specified as Sjogren's, bilateral: Secondary | ICD-10-CM | POA: Insufficient documentation

## 2018-04-22 DIAGNOSIS — H524 Presbyopia: Secondary | ICD-10-CM | POA: Insufficient documentation

## 2018-04-22 DIAGNOSIS — H5021 Vertical strabismus, right eye: Secondary | ICD-10-CM | POA: Insufficient documentation

## 2018-04-22 DIAGNOSIS — E119 Type 2 diabetes mellitus without complications: Secondary | ICD-10-CM | POA: Insufficient documentation

## 2018-04-22 DIAGNOSIS — Z7984 Long term (current) use of oral hypoglycemic drugs: Secondary | ICD-10-CM | POA: Insufficient documentation

## 2018-06-07 DIAGNOSIS — B353 Tinea pedis: Secondary | ICD-10-CM | POA: Insufficient documentation

## 2018-06-07 DIAGNOSIS — M7661 Achilles tendinitis, right leg: Secondary | ICD-10-CM | POA: Insufficient documentation

## 2018-06-07 DIAGNOSIS — M7662 Achilles tendinitis, left leg: Secondary | ICD-10-CM | POA: Insufficient documentation

## 2018-06-16 DIAGNOSIS — L233 Allergic contact dermatitis due to drugs in contact with skin: Secondary | ICD-10-CM | POA: Insufficient documentation

## 2019-04-24 DIAGNOSIS — F411 Generalized anxiety disorder: Secondary | ICD-10-CM | POA: Insufficient documentation

## 2019-04-24 DIAGNOSIS — Z789 Other specified health status: Secondary | ICD-10-CM | POA: Insufficient documentation

## 2019-05-18 DIAGNOSIS — Z79899 Other long term (current) drug therapy: Secondary | ICD-10-CM | POA: Insufficient documentation

## 2019-06-08 DIAGNOSIS — Z9114 Patient's other noncompliance with medication regimen: Secondary | ICD-10-CM | POA: Insufficient documentation

## 2020-01-25 NOTE — Progress Notes (Signed)
Cardiology Office Note:    Date:  01/26/2020   ID:  Briana Hartman, DOB 1934-08-03, MRN [id number]  PCP:  [name], MD  Cardiologist:  Shirlee More, MD   Referring MD: [name], MD  ASSESSMENT:    1. Chest pain of uncertain etiology   2. Essential hypertension   3. Venous thromboembolism (VTE)   4. Chronic obstructive pulmonary disease, unspecified COPD type ([place])   5. Type 2 diabetes mellitus without complication, without long-term current use of insulin ([place])   6. Mitral prolapse    PLAN:    In order of problems listed above:  1. Atypical but high risk check myocardial perfusion study if abnormal will need to consider coronary angiography and revascularization 2. Stable for now continue current medical treatment including amlodipine furosemide ARB beta-blocker. 3. She has a prothrombotic factor V Leyden abnormality on long-term anticoagulation managed by [facility] warfarin clinic.  She has lower extremity edema which appears postphlebitic and also related to gabapentin causing intense sodium retention if she changes primary care she need to be enrolled in our warfarin program. 4. Stable COPD followed by pulmonary [facility] 5. Stable diabetes 6. With cardiac symptoms check echocardiogram to determine the extent of MR.  Next appointment 4 weeks mitral prolapse   Medication Adjustments/Labs and Tests Ordered: Current medicines are reviewed at length with the patient today.  Concerns regarding medicines are outlined above.  No orders of the defined types were placed in this encounter.  No orders of the defined types were placed in this encounter.    Chief Complaint  Patient presents with  . Chest Pain    History of Present Illness:    Briana Hartman is a 86 y.o. female who is being seen today for the evaluation of chest pain at the request of the patient.  I have met her husband recently and she wanted to see me  Previously seen by Dr. Johnsie Cancel in 2015 with a history of hypertension, type 2 diabetes previous pulmonary embolism in 1995 and recurrent deep vein thrombosis with factor V Leiden mutation she is followed by [facility] pulmonary with stage II moderate COPD.  By gastroenterology with a history of gastric polyps and dysphagia.  She is a diabetic foot ulcer with amputation left foot second third toes. An echocardiogram performed 04/27/2014 showing normal left ventricular size and function trivial mitral regurgitation and no pericardial effusion.  In December she had 2 episodes of uncomfortable night she was abruptly awakened by severe chest pain that lasted for a brief.  The following there is no shortness of breath it was not pleuritic its far in her very concerned she has underlying coronary artery disease.  She has no exertional chest pain but she is quite short of breath that she attributes to her COPD.  She has chronic peripheral edema postphlebitic and takes a diuretic.  She does not have orthopnea cough or wheezing.  No palpitation or syncope.  She is pending endoscopy.  She has had no break in her anticoagulation and INRs have been therapeutic with valvular heart disease for further evaluation showed an echocardiogram and with multiple cardiovascular risk and chest pain myocardial perfusion study she is unaware of when she had last labs done we will check renal function lipids proBNP level and a CBC on anticoagulation.  Back in the office in a few weeks to review her studies with her Past Medical History:  Diagnosis Date  . ACID REFLUX  DISEASE   . Adenomatous colon polyp   . Allergy   . Anemia   . Anxiety   . Arthritis   . Asthma   . Blood transfusion without reported diagnosis   . Cataract   . Cervical radiculopathy   . COPD (chronic obstructive pulmonary disease) ([place])   . Diabetes mellitus   . Enlarged heart    born with  . Fundic gland polyps of stomach, benign 03/06/2015  .  Hyperlipidemia   . Hyperplastic polyp of stomach 03/06/2015  . HYPERTENSION   . HYPOTHYROIDISM   . Irritable bowel syndrome   . Long term current use of anticoagulant   . Lumbar spinal stenosis   . MITRAL VALVE PROLAPSE   . Peripheral neuropathy   . 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, chronic anticaog  . Skin cancer   . VENTRAL HERNIA     Past Surgical History:  Procedure Laterality Date  . ABDOMINAL HYSTERECTOMY    . APPENDECTOMY    . CATARACT EXTRACTION Right   . CESAREAN SECTION    . CHOLECYSTECTOMY    . COLONOSCOPY  05/24/2004   interal hemorrhoids, diverticulosis  . UPPER GASTROINTESTINAL ENDOSCOPY  12/31/2007   gastric polyp  . VARICOSE VEIN SURGERY Right     Current Medications: Current Meds  Medication Sig  . albuterol (VENTOLIN HFA) 108 (90 Base) MCG/ACT inhaler Inhale into the lungs.  . Alcohol Swabs (B-D SINGLE USE SWABS REGULAR) PADS   . amLODipine (NORVASC) 10 MG tablet   . atorvastatin (LIPITOR) 10 MG tablet Take 1 tablet (10 mg total) by mouth daily.  . Blood Glucose Monitoring Suppl (TRUE METRIX AIR GLUCOSE METER) w/Device KIT   . ciclopirox (LOPROX) 0.77 % cream   . DICLOFENAC SODIUM & LIDOCAINE CO Apply topically 2 (two) times daily as needed. 1%/0.25% gel  . Elastic Bandages & Supports (ABDOMINAL BINDER/ELASTIC XL) MISC 1 Device by Does not apply route as needed (to prevent abdominal pain from hernia).  . fluticasone (FLOVENT HFA) 220 MCG/ACT inhaler Inhale into the lungs.  . furosemide (LASIX) 40 MG tablet Take 1 tablet (40 mg total) by mouth 2 (two) times daily.  Marland Kitchen gabapentin (NEURONTIN) 100 MG capsule   . gabapentin (NEURONTIN) 300 MG capsule   . glucose blood (TRUE METRIX BLOOD GLUCOSE TEST) test strip TEST TWO TIMES DAILY  . levothyroxine (SYNTHROID, LEVOTHROID) 25 MCG tablet Take 1 tablet (25 mcg total) by mouth daily.  Marland Kitchen LORazepam (ATIVAN) 1 MG tablet TAKE ONE TABLET BY MOUTH TWICE DAILY AS NEEDED FOR ANXIETY  . losartan (COZAAR) 100 MG tablet TAKE  ONE TABLET BY MOUTH ONCE DAILY  . metFORMIN (GLUCOPHAGE) 500 MG tablet   . metoprolol (LOPRESSOR) 50 MG tablet TAKE ONE TABLET BY MOUTH TWICE DAILY  . mupirocin ointment (BACTROBAN) 2 %   . nitroGLYCERIN (NITROSTAT) 0.4 MG SL tablet Place 0.4 mg under the tongue every 5 (five) minutes as needed.    Marland Kitchen omeprazole (PRILOSEC) 20 MG capsule   . Tiotropium Bromide-Olodaterol (STIOLTO RESPIMAT) 2.5-2.5 MCG/ACT AERS Inhale into the lungs.  . triamcinolone (KENALOG) 0.025 % cream   . TRUEplus Lancets 33G MISC   . warfarin (COUMADIN) 6 MG tablet Take as directed by anticoagulation clinic     Allergies:   Ampicillin, Aspirin, Caffeine, Codeine, Lipitor [atorvastatin], Penicillins, and Ketoconazole   Social History   Socioeconomic History  . Marital status: Married    Spouse name: Not on file  . Number of children: 3  . Years of education:  Not on file  . Highest education level: Not on file  Occupational History  . 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 Smoking status: Never Smoker  . Smokeless tobacco: Never Used  Substance and Sexual Activity  . Alcohol use: No    Alcohol/week: 0.0 standard drinks  . Drug use: No  . Sexual activity: Yes  Other Topics Concern  . Not on file  Social History Narrative  . Not on file   Social Determinants of Health   Financial Resource Strain:   . Difficulty of Paying Living Expenses: Not on file  Food Insecurity:   . Worried About Charity fundraiser in the Last Year: Not on file  . Ran Out of Food in the Last Year: Not on file  Transportation Needs:   . Lack of Transportation (Medical): Not on file  . Lack of Transportation (Non-Medical): Not on file  Physical Activity:   . Days of Exercise per Week: Not on file  . Minutes of Exercise per Session: Not on file  Stress:   . Feeling of Stress : Not on file  Social Connections:   . Frequency of Communication with Friends and Family: Not on file  . Frequency of Social Gatherings with Friends  and Family: Not on file  . Attends Religious Services: Not on file  . Active Member of Clubs or Organizations: Not on file  . Attends Archivist Meetings: Not on file  . Marital Status: Not on file     Family History: The patient's family history includes Breast cancer in her sister; Cancer in her brother; Clotting disorder in her mother; Diabetes in her brother and sister; Heart disease in her mother and sister; Liver cancer in her sister; Pancreatic cancer in her brother and sister; Pancreatic disease in her sister.  ROS:   ROS Please see the history of present illness.     All other systems reviewed and are negative.  EKGs/Labs/Other Studies Reviewed:    The following studies were reviewed today:   EKG:  EKG is  ordered today.  The ekg ordered today is personally reviewed and demonstrates sinus rhythm right bundle branch block  Echocardiogram done [facility] 02/03/2018 shows normal left ventricular size and systolic function EF 55 to 60% mild concentric LVH valvular dysfunction normal right and left ventricular function.  Recent Labs: No results found for requested labs within last 8760 hours.  Recent Lipid Panel    Component Value Date/Time   CHOL 208 (H) 05/10/2014 1424   TRIG 285.0 (H) 05/10/2014 1424   HDL 32.40 (L) 05/10/2014 1424   CHOLHDL 6 05/10/2014 1424   VLDL 57.0 (H) 05/10/2014 1424   LDLCALC 119 (H) 05/10/2014 1424   LDLDIRECT 113.4 05/31/2013 1425    Physical Exam:    VS:  BP (!) 164/88   Pulse 83   Ht 5' 6"  (1.676 m)   Wt 192 lb (87.1 kg)   SpO2 95%   BMI 30.99 kg/m     Wt Readings from Last 3 Encounters:  01/26/20 192 lb (87.1 kg)  03/02/15 189 lb (85.7 kg)  01/22/15 189 lb 4 oz (85.8 kg)     GEN:  Well nourished, well developed in no acute distress HEENT: Normal NECK: No JVD; No carotid bruits LYMPHATICS: No lymphadenopathy CARDIAC: RRR, no murmurs, rubs, gallops RESPIRATORY:  Clear to auscultation without rales,  wheezing or rhonchi  ABDOMEN: Soft, non-tender, non-distended MUSCULOSKELETAL: 1-2+ bilateral lower extremity pitting edema edema;  No deformity  SKIN: Warm and dry NEUROLOGIC:  Alert and oriented x 3 PSYCHIATRIC:  Normal affect     Signed, Shirlee More, MD  01/26/2020 4:04 PM    [facility]

## 2020-01-26 ENCOUNTER — Other Ambulatory Visit: Payer: Self-pay

## 2020-01-26 ENCOUNTER — Ambulatory Visit (INDEPENDENT_AMBULATORY_CARE_PROVIDER_SITE_OTHER): Payer: Medicare HMO | Admitting: Cardiology

## 2020-01-26 ENCOUNTER — Encounter: Payer: Self-pay | Admitting: Cardiology

## 2020-01-26 VITALS — BP 164/88 | HR 83 | Ht 66.0 in | Wt 192.0 lb

## 2020-01-26 DIAGNOSIS — I341 Nonrheumatic mitral (valve) prolapse: Secondary | ICD-10-CM

## 2020-01-26 DIAGNOSIS — J449 Chronic obstructive pulmonary disease, unspecified: Secondary | ICD-10-CM | POA: Diagnosis not present

## 2020-01-26 DIAGNOSIS — I10 Essential (primary) hypertension: Secondary | ICD-10-CM | POA: Diagnosis not present

## 2020-01-26 DIAGNOSIS — I8290 Acute embolism and thrombosis of unspecified vein: Secondary | ICD-10-CM

## 2020-01-26 DIAGNOSIS — R079 Chest pain, unspecified: Secondary | ICD-10-CM

## 2020-01-26 DIAGNOSIS — E119 Type 2 diabetes mellitus without complications: Secondary | ICD-10-CM

## 2020-01-26 NOTE — Addendum Note (Signed)
Addended by: Beckey Rutter on: 01/26/2020 05:05 PM   Modules accepted: Orders

## 2020-01-26 NOTE — Patient Instructions (Addendum)
Medication Instructions:  Your physician recommends that you continue on your current medications as directed. Please refer to the Current Medication list given to you today.  If you need a refill on your cardiac medications before your next appointment, please call your pharmacy.   Lab work: Your physician recommends that you have a CMET, BNP, CBC and lipid drawn  If you have labs (blood work) drawn today and your tests are completely normal, you will receive your results only by: Marland Kitchen MyChart Message (if you have MyChart) OR . A paper copy in the mail If you have any lab test that is abnormal or we need to change your treatment, we will call you to review the results.  Testing/Procedures: You had an EKG performed today.  Your physician has requested that you have an echocardiogram. Echocardiography is a painless test that uses sound waves to create images of your heart. It provides your doctor with information about the size and shape of your heart and how well your heart's chambers and valves are working. This procedure takes approximately one hour. There are no restrictions for this procedure.  Your physician has requested that you have a lexiscan myoview. For further information please visit [email or web address]. Please follow instruction sheet, as given.    Follow-Up: At [facility], you and your health needs are our priority.  As part of our continuing mission to provide you with exceptional heart care, we have created designated Provider Care Teams.  These Care Teams include your primary Cardiologist (physician) and Advanced Practice Providers (APPs -  Physician Assistants and Nurse Practitioners) who all work together to provide you with the care you need, when you need it. You will need a follow up appointment in 6 weeks   Any Other Special Instructions Will Be Listed Below  Regadenoson injection What is this medicine? REGADENOSON is used to test the heart for coronary artery  disease. It is used in patients who can not exercise for their stress test. This medicine may be used for other purposes; ask your health care provider or pharmacist if you have questions. COMMON BRAND NAME(S): Lexiscan What should I tell my health care provider before I take this medicine? They need to know if you have any of these conditions:  heart problems  lung or breathing disease, like asthma or COPD  an unusual or allergic reaction to regadenoson, other medicines, foods, dyes, or preservatives  pregnant or trying to get pregnant  breast-feeding How should I use this medicine? This medicine is for injection into a vein. It is given by a health care professional in a hospital or clinic setting. Talk to your pediatrician regarding the use of this medicine in children. Special care may be needed. Overdosage: If you think you have taken too much of this medicine contact a poison control center or emergency room at once. NOTE: This medicine is only for you. Do not share this medicine with others. What if I miss a dose? This does not apply. What may interact with this medicine?  caffeine  dipyridamole  guarana  theophylline This list may not describe all possible interactions. Give your health care provider a list of all the medicines, herbs, non-prescription drugs, or dietary supplements you use. Also tell them if you smoke, drink alcohol, or use illegal drugs. Some items may interact with your medicine. What should I watch for while using this medicine? Your condition will be monitored carefully while you are receiving this medicine. Do not take medicines, foods, or  drinks with caffeine (like coffee, tea, or colas) for at least 12 hours before your test. If you do not know if something contains caffeine, ask your health care professional. What side effects may I notice from receiving this medicine? Side effects that you should report to your doctor or health care professional  as soon as possible:  allergic reactions like skin rash, itching or hives, swelling of the face, lips, or tongue  breathing problems  chest pain, tightness or palpitations  severe headache Side effects that usually do not require medical attention (report to your doctor or health care professional if they continue or are bothersome):  flushing  headache  irritation or pain at site where injected  nausea, vomiting This list may not describe all possible side effects. Call your doctor for medical advice about side effects. You may report side effects to FDA at 1-800-FDA-1088. Where should I keep my medicine? This drug is given in a hospital or clinic and will not be stored at home. NOTE: This sheet is a summary. It may not cover all possible information. If you have questions about this medicine, talk to your doctor, pharmacist, or health care provider.  2020 Elsevier/Gold Standard (2008-08-07 15:08:13)  Cardiac Nuclear Scan A cardiac nuclear scan is a test that is done to check the flow of blood to your heart. It is done when you are resting and when you are exercising. The test looks for problems such as:  Not enough blood reaching a portion of the heart.  The heart muscle not working as it should. You may need this test if:  You have heart disease.  You have had lab results that are not normal.  You have had heart surgery or a balloon procedure to open up blocked arteries (angioplasty).  You have chest pain.  You have shortness of breath. In this test, a special dye (tracer) is put into your bloodstream. The tracer will travel to your heart. A camera will then take pictures of your heart to see how the tracer moves through your heart. This test is usually done at a hospital and takes 2-4 hours. Tell a doctor about:  Any allergies you have.  All medicines you are taking, including vitamins, herbs, eye drops, creams, and over-the-counter medicines.  Any problems you or  family members have had with anesthetic medicines.  Any blood disorders you have.  Any surgeries you have had.  Any medical conditions you have.  Whether you are pregnant or may be pregnant. What are the risks? Generally, this is a safe test. However, problems may occur, such as:  Serious chest pain and heart attack. This is only a risk if the stress portion of the test is done.  Rapid heartbeat.  A feeling of warmth in your chest. This feeling usually does not last long.  Allergic reaction to the tracer. What happens before the test?  Ask your doctor about changing or stopping your normal medicines. This is important.  Follow instructions from your doctor about what you cannot eat or drink.  Remove your jewelry on the day of the test. What happens during the test?  An IV tube will be inserted into one of your veins.  Your doctor will give you a small amount of tracer through the IV tube.  You will wait for 20-40 minutes while the tracer moves through your bloodstream.  Your heart will be monitored with an electrocardiogram (ECG).  You will lie down on an exam table.  Pictures  of your heart will be taken for about 15-20 minutes.  You may also have a stress test. For this test, one of these things may be done: ? You will be asked to exercise on a treadmill or a stationary bike. ? You will be given medicines that will make your heart work harder. This is done if you are unable to exercise.  When blood flow to your heart has peaked, a tracer will again be given through the IV tube.  After 20-40 minutes, you will get back on the exam table. More pictures will be taken of your heart.  Depending on the tracer that is used, more pictures may need to be taken 3-4 hours later.  Your IV tube will be removed when the test is over. The test may vary among doctors and hospitals. What happens after the test?  Ask your doctor: ? Whether you can return to your normal schedule,  including diet, activities, and medicines. ? Whether you should drink more fluids. This will help to remove the tracer from your body. Drink enough fluid to keep your pee (urine) pale yellow.  Ask your doctor, or the department that is doing the test: ? When will my results be ready? ? How will I get my results? Summary  A cardiac nuclear scan is a test that is done to check the flow of blood to your heart.  Tell your doctor whether you are pregnant or may be pregnant.  Before the test, ask your doctor about changing or stopping your normal medicines. This is important.  Ask your doctor whether you can return to your normal activities. You may be asked to drink more fluids. This information is not intended to replace advice given to you by your health care provider. Make sure you discuss any questions you have with your health care provider. Document Released: 05/24/2018 Document Revised: 03/30/2019 Document Reviewed: 05/24/2018 Elsevier Patient Education  [place].  Echocardiogram An echocardiogram is a procedure that uses painless sound waves (ultrasound) to produce an image of the heart. Images from an echocardiogram can provide important information about:  Signs of coronary artery disease (CAD).  Aneurysm detection. An aneurysm is a weak or damaged part of an artery wall that bulges out from the normal force of blood pumping through the body.  Heart size and shape. Changes in the size or shape of the heart can be associated with certain conditions, including heart failure, aneurysm, and CAD.  Heart muscle function.  Heart valve function.  Signs of a past heart attack.  Fluid buildup around the heart.  Thickening of the heart muscle.  A tumor or infectious growth around the heart valves. Tell a health care provider about:  Any allergies you have.  All medicines you are taking, including vitamins, herbs, eye drops, creams, and over-the-counter medicines.  Any  blood disorders you have.  Any surgeries you have had.  Any medical conditions you have.  Whether you are pregnant or may be pregnant. What are the risks? Generally, this is a safe procedure. However, problems may occur, including:  Allergic reaction to dye (contrast) that may be used during the procedure. What happens before the procedure? No specific preparation is needed. You may eat and drink normally. What happens during the procedure?   An IV tube may be inserted into one of your veins.  You may receive contrast through this tube. A contrast is an injection that improves the quality of the pictures from your heart.  A gel will be applied to your chest.  A wand-like tool (transducer) will be moved over your chest. The gel will help to transmit the sound waves from the transducer.  The sound waves will harmlessly bounce off of your heart to allow the heart images to be captured in real-time motion. The images will be recorded on a computer. The procedure may vary among health care providers and hospitals. What happens after the procedure?  You may return to your normal, everyday life, including diet, activities, and medicines, unless your health care provider tells you not to do that. Summary  An echocardiogram is a procedure that uses painless sound waves (ultrasound) to produce an image of the heart.  Images from an echocardiogram can provide important information about the size and shape of your heart, heart muscle function, heart valve function, and fluid buildup around your heart.  You do not need to do anything to prepare before this procedure. You may eat and drink normally.  After the echocardiogram is completed, you may return to your normal, everyday life, unless your health care provider tells you not to do that. This information is not intended to replace advice given to you by your health care provider. Make sure you discuss any questions you have with your  health care provider. Document Released: 12/05/2000 Document Revised: 03/31/2019 Document Reviewed: 01/10/2017 Elsevier Patient Education  2020 [employer].

## 2020-01-27 ENCOUNTER — Telehealth: Payer: Self-pay | Admitting: *Deleted

## 2020-01-27 LAB — COMPREHENSIVE METABOLIC PANEL
ALT: 12 IU/L (ref 0–32)
AST: 14 IU/L (ref 0–40)
Albumin/Globulin Ratio: 2 (ref 1.2–2.2)
Albumin: 4.3 g/dL (ref 3.6–4.6)
Alkaline Phosphatase: 64 IU/L (ref 39–117)
BUN/Creatinine Ratio: 13 (ref 12–28)
BUN: 12 mg/dL (ref 8–27)
Bilirubin Total: <0.2 mg/dL (ref 0.0–1.2)
CO2: 25 mmol/L (ref 20–29)
Calcium: 9.6 mg/dL (ref 8.7–10.3)
Chloride: 101 mmol/L (ref 96–106)
Creatinine, Ser: 0.95 mg/dL (ref 0.57–1.00)
GFR calc Af Amer: 64 mL/min/{1.73_m2} (ref >59)
GFR calc non Af Amer: 56 mL/min/{1.73_m2} — ABNORMAL LOW (ref >59)
Globulin, Total: 2.2 g/dL (ref 1.5–4.5)
Glucose: 141 mg/dL — ABNORMAL HIGH (ref 65–99)
Potassium: 4.3 mmol/L (ref 3.5–5.2)
Sodium: 144 mmol/L (ref 134–144)
Total Protein: 6.5 g/dL (ref 6.0–8.5)

## 2020-01-27 LAB — CBC
Hematocrit: 40.1 % (ref 34.0–46.6)
Hemoglobin: 13.4 g/dL (ref 11.1–15.9)
MCH: 27.6 pg (ref 26.6–33.0)
MCHC: 33.4 g/dL (ref 31.5–35.7)
MCV: 83 fL (ref 79–97)
Platelets: 295 10*3/uL (ref 150–450)
RBC: 4.85 x10E6/uL (ref 3.77–5.28)
RDW: 14.1 % (ref 11.7–15.4)
WBC: 12.5 10*3/uL — ABNORMAL HIGH (ref 3.4–10.8)

## 2020-01-27 LAB — LIPID PANEL
Chol/HDL Ratio: 6.8 ratio — ABNORMAL HIGH (ref 0.0–4.4)
Cholesterol, Total: 223 mg/dL — ABNORMAL HIGH (ref 100–199)
HDL: 33 mg/dL — ABNORMAL LOW (ref >39)
LDL Chol Calc (NIH): 93 mg/dL (ref 0–99)
Triglycerides: 583 mg/dL (ref 0–149)
VLDL Cholesterol Cal: 97 mg/dL — ABNORMAL HIGH (ref 5–40)

## 2020-01-27 LAB — PRO B NATRIURETIC PEPTIDE: NT-Pro BNP: 250 pg/mL (ref 0–738)

## 2020-01-27 MED ORDER — OMEGA-3-ACID ETHYL ESTERS 1 G PO CAPS: 2.0000 g | ORAL_CAPSULE | Freq: Two times a day (BID) | ORAL | 3 refills | Status: DC

## 2020-01-27 NOTE — Telephone Encounter (Signed)
Telephone call to patient. Informed of lab results and need to take Lovaza 2 gms twice daily.Pt verbalized understanding and prescription sent to [facility] per her request.

## 2020-01-27 NOTE — Telephone Encounter (Signed)
-----   Message from Richardo Priest, MD sent at 01/27/2020  7:31 AM EST ----- Normal or stable result  Except her triglycerides in particular and lipids are quite abnormal despite taking atorvastatin.  Like her to start Lovaza prescription strength purified fish oil 2 capsules twice daily dispense 90 days refill 3

## 2020-02-06 ENCOUNTER — Telehealth (HOSPITAL_COMMUNITY): Payer: Self-pay

## 2020-02-06 NOTE — Telephone Encounter (Signed)
Patient given detailed instructions per Myocardial Perfusion Study Information Sheet for the test on 02/20/2020 at 10am. Patient notified to arrive 15 minutes early and that it is imperative to arrive on time for appointment to keep from having the test rescheduled.  If you need to cancel or reschedule your appointment, please call the office within 24 hours of your appointment. . Patient verbalized understanding. EK

## 2020-02-13 ENCOUNTER — Encounter (HOSPITAL_COMMUNITY): Payer: Medicare HMO

## 2020-02-13 ENCOUNTER — Other Ambulatory Visit (HOSPITAL_COMMUNITY): Payer: Medicare HMO

## 2020-02-15 ENCOUNTER — Telehealth: Payer: Self-pay | Admitting: Cardiology

## 2020-02-15 ENCOUNTER — Telehealth (HOSPITAL_COMMUNITY): Payer: Self-pay | Admitting: *Deleted

## 2020-02-15 NOTE — Telephone Encounter (Signed)
Pt c/o medication issue:  1. Name of Medication: omega-3 acid ethyl esters (LOVAZA) 1 g capsule  2. How are you currently taking this medication (dosage and times per day)? 4 capsules / day  3. Are you having a reaction (difficulty breathing--STAT)? No  4. What is your medication issue? Patient states medication has caused blood to become thin and she was advised to hold medication during INR. Please advise.

## 2020-02-15 NOTE — Telephone Encounter (Signed)
I reviewed the note from the [facility] anticoagulant clinic there is no mention of this but they manage her warfarin and she was told to stop it I would stop it.

## 2020-02-15 NOTE — Telephone Encounter (Signed)
Called patient. Informed her per Dr. Bettina Gavia to stop if coumadin team at [facility] advised her too. She verbally understood. No further questions.

## 2020-02-15 NOTE — Telephone Encounter (Signed)
Patient given detailed instructions per Myocardial Perfusion Study Information Sheet for the test on 02/20/20 at 10:00. Patient notified to arrive 15 minutes early and that it is imperative to arrive on time for appointment to keep from having the test rescheduled.  If you need to cancel or reschedule your appointment, please call the office within 24 hours of your appointment. . Patient verbalized understanding.Briana Hartman

## 2020-02-20 ENCOUNTER — Ambulatory Visit (HOSPITAL_BASED_OUTPATIENT_CLINIC_OR_DEPARTMENT_OTHER): Payer: Medicare HMO

## 2020-02-20 ENCOUNTER — Ambulatory Visit (HOSPITAL_COMMUNITY): Payer: Medicare HMO | Attending: Internal Medicine

## 2020-02-20 ENCOUNTER — Other Ambulatory Visit: Payer: Self-pay

## 2020-02-20 ENCOUNTER — Other Ambulatory Visit (HOSPITAL_COMMUNITY): Payer: Medicare HMO

## 2020-02-20 VITALS — Ht 66.0 in | Wt 192.0 lb

## 2020-02-20 DIAGNOSIS — R079 Chest pain, unspecified: Secondary | ICD-10-CM | POA: Diagnosis not present

## 2020-02-20 DIAGNOSIS — I10 Essential (primary) hypertension: Secondary | ICD-10-CM | POA: Insufficient documentation

## 2020-02-20 DIAGNOSIS — I341 Nonrheumatic mitral (valve) prolapse: Secondary | ICD-10-CM

## 2020-02-20 DIAGNOSIS — I8290 Acute embolism and thrombosis of unspecified vein: Secondary | ICD-10-CM | POA: Diagnosis present

## 2020-02-20 DIAGNOSIS — J449 Chronic obstructive pulmonary disease, unspecified: Secondary | ICD-10-CM | POA: Diagnosis present

## 2020-02-20 LAB — ECHOCARDIOGRAM COMPLETE
Height: 66 in
Weight: 3072 oz

## 2020-02-20 LAB — MYOCARDIAL PERFUSION IMAGING
LV dias vol: 79 mL (ref 46–106)
LV sys vol: 26 mL
Peak HR: 81 {beats}/min
Rest HR: 67 {beats}/min
SDS: 1
SRS: 0
SSS: 1
TID: 1.05

## 2020-02-20 MED ORDER — REGADENOSON 0.4 MG/5ML IV SOLN
0.4000 mg | Freq: Once | INTRAVENOUS | Status: AC
  Administered 2020-02-20: 12:00:00 0.4 mg via INTRAVENOUS

## 2020-02-20 MED ORDER — TECHNETIUM TC 99M TETROFOSMIN IV KIT
10.0000 | PACK | Freq: Once | INTRAVENOUS | Status: AC | PRN
  Administered 2020-02-20: 10 via INTRAVENOUS
  Filled 2020-02-20: qty 10

## 2020-02-20 MED ORDER — TECHNETIUM TC 99M TETROFOSMIN IV KIT
32.2000 | PACK | Freq: Once | INTRAVENOUS | Status: AC | PRN
  Administered 2020-02-20: 32.2 via INTRAVENOUS
  Filled 2020-02-20: qty 33

## 2020-02-23 ENCOUNTER — Telehealth: Payer: Self-pay

## 2020-02-23 NOTE — Telephone Encounter (Signed)
-----   Message from Richardo Priest, MD sent at 02/23/2020  1:49 PM EST ----- Normal or stable result  Good result no findings of heart failure or mitral valve leakage

## 2020-02-23 NOTE — Telephone Encounter (Signed)
-----   Message from Richardo Priest, MD sent at 02/23/2020  1:50 PM EST ----- Normal or stable result  Has office FU next week

## 2020-02-23 NOTE — Telephone Encounter (Signed)
The patient has been notified of the result and verbalized understanding.  All questions (if any) were answered. Frederik Schmidt, RN 02/23/2020 2:21 PM

## 2020-02-23 NOTE — Telephone Encounter (Signed)
The patient has been notified of the Echo result and verbalized understanding.  All questions (if any) were answered. Frederik Schmidt, RN 02/23/2020 2:12 PM

## 2020-03-02 ENCOUNTER — Other Ambulatory Visit: Payer: Self-pay

## 2020-03-02 ENCOUNTER — Encounter: Payer: Self-pay | Admitting: Cardiology

## 2020-03-02 ENCOUNTER — Ambulatory Visit: Payer: Medicare HMO | Admitting: Cardiology

## 2020-03-02 VITALS — BP 118/68 | HR 80 | Temp 97.0°F | Ht 66.0 in | Wt 196.0 lb

## 2020-03-02 DIAGNOSIS — I119 Hypertensive heart disease without heart failure: Secondary | ICD-10-CM | POA: Diagnosis not present

## 2020-03-02 DIAGNOSIS — R079 Chest pain, unspecified: Secondary | ICD-10-CM

## 2020-03-02 DIAGNOSIS — Z7901 Long term (current) use of anticoagulants: Secondary | ICD-10-CM | POA: Diagnosis not present

## 2020-03-02 DIAGNOSIS — I059 Rheumatic mitral valve disease, unspecified: Secondary | ICD-10-CM

## 2020-03-02 DIAGNOSIS — E782 Mixed hyperlipidemia: Secondary | ICD-10-CM

## 2020-03-02 DIAGNOSIS — J449 Chronic obstructive pulmonary disease, unspecified: Secondary | ICD-10-CM

## 2020-03-02 MED ORDER — NITROGLYCERIN 0.4 MG SL SUBL: 0.4000 mg | SUBLINGUAL_TABLET | SUBLINGUAL | 1 refills | Status: DC | PRN

## 2020-03-02 MED ORDER — FENOFIBRATE 145 MG PO TABS: 145.0000 mg | ORAL_TABLET | Freq: Every day | ORAL | 1 refills | Status: DC

## 2020-03-02 NOTE — Patient Instructions (Addendum)
Medication Instructions:  Your physician has recommended you make the following change in your medication:  1.  START Tricor 145 mg taking 1 daily  *If you need a refill on your cardiac medications before your next appointment, please call your pharmacy*   Lab Work: None ordered  If you have labs (blood work) drawn today and your tests are completely normal, you will receive your results only by: Marland Kitchen MyChart Message (if you have MyChart) OR . A paper copy in the mail If you have any lab test that is abnormal or we need to change your treatment, we will call you to review the results.   Testing/Procedures: None ordered  You have been referred to Dr. Nani Ravens at [facility].  We will call and see if we can make an apt for you   Follow-Up: At [facility], you and your health needs are our priority.  As part of our continuing mission to provide you with exceptional heart care, we have created designated Provider Care Teams.  These Care Teams include your primary Cardiologist (physician) and Advanced Practice Providers (APPs -  Physician Assistants and Nurse Practitioners) who all work together to provide you with the care you need, when you need it.  We recommend signing up for the patient portal called "MyChart".  Sign up information is provided on this After Visit Summary.  MyChart is used to connect with patients for Virtual Visits (Telemedicine).  Patients are able to view lab/test results, encounter notes, upcoming appointments, etc.  Non-urgent messages can be sent to your provider as well.   To learn more about what you can do with MyChart, go to [email or web address].    Your next appointment:   1 year in office with Dr. Bettina Gavia

## 2020-03-02 NOTE — Progress Notes (Signed)
Cardiology Office Note:    Date:  03/02/2020   ID:  Briana Hartman, DOB 10/25/1934, MRN [id number]  PCP:  [name], MD  Cardiologist:  Shirlee More, MD    Referring MD: [name], MD    ASSESSMENT:    1. Chest pain of uncertain etiology   2. Hypertensive heart disease without heart failure   3. Mixed hyperlipidemia   4. Chronic anticoagulation   5. Mitral valve disease   6. Chronic obstructive pulmonary disease, unspecified COPD type ([place])    PLAN:    In order of problems listed above:  1. Had no recurrence of chest pain.  Her myocardial perfusion study is normal reassuring but I will arm her with a prescription of nitroglycerin that she can use as needed if having frequent chest pain will need to reconsider further evaluation such as angiography 2. Stable BP at target continue current treatment 3. Triglycerides are elevated was unable to take prescription fish oil at the direction of the warfarin clinic and will add TriCor  4. Managed by the warfarin clinic 5. Echocardiogram shows no evidence of significant mitral regurgitation or valvular dysfunction 6. COPD is slowly worsening follow-up with her pulmonologist and there is been discussion of needing ambulatory oxygen   Next appointment: 1 year   Medication Adjustments/Labs and Tests Ordered: Current medicines are reviewed at length with the patient today.  Concerns regarding medicines are outlined above.  No orders of the defined types were placed in this encounter.  Meds ordered this encounter  Medications  . nitroGLYCERIN (NITROSTAT) 0.4 MG SL tablet    Sig: Place 1 tablet (0.4 mg total) under the tongue every 5 (five) minutes as needed.    Dispense:  25 tablet    Refill:  1    No chief complaint on file.   History of Present Illness:    Briana Hartman is a 86 y.o. female with a hx of hypertension, type 2 diabetes previous pulmonary embolism in 1995 and recurrent deep vein  thrombosis with factor V Leiden mutation she is followed by [facility] pulmonary with stage II moderate COPD.  By gastroenterology with a history of gastric polyps and dysphagia.  She is a diabetic foot ulcer with amputation left foot second third toes.   She was  last seen 01/26/2020 for chest pain nonanginal in nature.  Compliance with diet, lifestyle and medications: Yes  I reviewed the results of testing with patient and her son.  No further chest pain she is having progressive shortness of breath with any activities sees a pulmonary physician and there is discussion of need ambulatory oxygen.  No edema orthopnea palpitation or syncope.  Myoview 02/20/2020: Study Highlights    Nuclear stress EF: 67%. No wall motion abnormalities detected  There was no ST segment deviation noted during stress.  This is a low risk study. No ischemia or infarct identified.  The study is normal.   Stress Findings  ECG Baseline ECG exhibits normal sinus rhythm.Baseline ECG indicates right bundle branch block. .  Stress Findings A pharmacological stress test was performed using IV Lexiscan 0.49m over 10 seconds performed without concurrent submaximal exercise.   The patient reported chest pain, shortness of breath and flushing during the stress test.   Test was stopped per protocol.  Response to Stress There was no ST segment deviation noted during stress.   Nuclear Stress Findings  Isotope administration Rest isotope was administered  with an IV injection of 10.0  mCi Tc6mTetrofosmin.  Rest SPECT images were obtained approximately 45 minutes post tracer injection.  Stress isotope was administered  with an IV injection of 32.2 mCi [id number]   Stress SPECT images were obtained approximately 60 minutes post tracer injection.  Nuclear Measurements Study was gated.  Rest Perfusion Rest perfusion normal.  Stress Perfusion Stress perfusion normal.  Overall Study Impression Myocardial  perfusion is normal.   The study is normal.   This is a low risk study.  Overall left ventricular systolic function was normal.    Nuclear stress EF:  67%.     Echo cardiogram 02/20/2020: Normal left ventricular size and function moderate concentric LVH 1. Left ventricular ejection fraction, by estimation, is 55 to 60%. The  left ventricle has normal function. The left ventricle has no regional  wall motion abnormalities. There is moderate left ventricular hypertrophy.  Left ventricular diastolic  parameters were normal.  2. Right ventricular systolic function is normal. The right ventricular  size is normal. Tricuspid regurgitation signal is inadequate for assessing  PA pressure.  3. The mitral valve is normal in structure and function. No evidence of  mitral valve regurgitation.  4. The aortic valve is tricuspid. Aortic valve regurgitation is not  visualized. No aortic stenosis is present.  5. The inferior vena cava is normal in size with greater than 50%  respiratory variability, suggesting right atrial pressure of 3 mmHg.  Past Medical History:  Diagnosis Date  . ACID REFLUX DISEASE   . Adenomatous colon polyp   . Allergy   . Anemia   . Anxiety   . Arthritis   . Asthma   . Blood transfusion without reported diagnosis   . Cataract   . Cervical radiculopathy   . [place]  . Diabetes mellitus   . Enlarged heart    born with  . Fundic gland polyps of stomach, benign 03/06/2015  . Hyperlipidemia   . Hyperplastic polyp of stomach 03/06/2015  . HYPERTENSION   . HYPOTHYROIDISM   . Irritable bowel syndrome   . Long term current use of anticoagulant   . Lumbar spinal stenosis   . MITRAL VALVE PROLAPSE   . Peripheral neuropathy   . 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, chronic anticaog  . Skin cancer   . VENTRAL HERNIA     Past Surgical History:  Procedure Laterality Date  . ABDOMINAL HYSTERECTOMY    . APPENDECTOMY    .  CATARACT EXTRACTION Right   . CESAREAN SECTION    . CHOLECYSTECTOMY    . COLONOSCOPY  05/24/2004   interal hemorrhoids, diverticulosis  . UPPER GASTROINTESTINAL ENDOSCOPY  12/31/2007   gastric polyp  . VARICOSE VEIN SURGERY Right     Current Medications: Current Meds  Medication Sig  . albuterol (VENTOLIN HFA) 108 (90 Base) MCG/ACT inhaler Inhale into the lungs.  . Alcohol Swabs (B-D SINGLE USE SWABS REGULAR) PADS   . amLODipine (NORVASC) 10 MG tablet   . atorvastatin (LIPITOR) 10 MG tablet Take 1 tablet (10 mg total) by mouth daily.  . Blood Glucose Monitoring Suppl (TRUE METRIX AIR GLUCOSE METER) w/Device KIT   . Elastic Bandages & Supports (ABDOMINAL BINDER/ELASTIC XL) MISC 1 Device by Does not apply route as needed (to prevent abdominal pain from hernia).  . fluticasone (FLOVENT HFA) 220 MCG/ACT inhaler Inhale into the lungs.  . furosemide (LASIX) 40 MG tablet Take 1 tablet (40 mg total) by mouth 2 (two) times daily.  .Marland Kitchen  gabapentin (NEURONTIN) 100 MG capsule   . gabapentin (NEURONTIN) 300 MG capsule   . glucose blood (TRUE METRIX BLOOD GLUCOSE TEST) test strip TEST TWO TIMES DAILY  . levothyroxine (SYNTHROID, LEVOTHROID) 25 MCG tablet Take 1 tablet (25 mcg total) by mouth daily.  Marland Kitchen losartan (COZAAR) 100 MG tablet TAKE ONE TABLET BY MOUTH ONCE DAILY  . metFORMIN (GLUCOPHAGE) 500 MG tablet   . metoprolol (LOPRESSOR) 50 MG tablet TAKE ONE TABLET BY MOUTH TWICE DAILY  . nitroGLYCERIN (NITROSTAT) 0.4 MG SL tablet Place 1 tablet (0.4 mg total) under the tongue every 5 (five) minutes as needed.  Marland Kitchen omeprazole (PRILOSEC) 20 MG capsule   . TRUEplus Lancets 33G MISC   . warfarin (COUMADIN) 6 MG tablet Take as directed by anticoagulation clinic  . [DISCONTINUED] nitroGLYCERIN (NITROSTAT) 0.4 MG SL tablet Place 0.4 mg under the tongue every 5 (five) minutes as needed.       Allergies:   Ampicillin, Aspirin, Caffeine, Codeine, Lipitor [atorvastatin], Penicillins, and Ketoconazole   Social  History   Socioeconomic History  . Marital status: Married    Spouse name: Not on file  . Number of children: 3  . Years of education: Not on file  . Highest education level: Not on file  Occupational History  . 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 Smoking status: Never Smoker  . Smokeless tobacco: Never Used  Substance and Sexual Activity  . Alcohol use: No    Alcohol/week: 0.0 standard drinks  . Drug use: No  . Sexual activity: Yes  Other Topics Concern  . Not on file  Social History Narrative  . Not on file   Social Determinants of Health   Financial Resource Strain:   . Difficulty of Paying Living Expenses:   Food Insecurity:   . Worried About Charity fundraiser in the Last Year:   . Arboriculturist in the Last Year:   Transportation Needs:   . Film/video editor (Medical):   Marland Kitchen Lack of Transportation (Non-Medical):   Physical Activity:   . Days of Exercise per Week:   . Minutes of Exercise per Session:   Stress:   . Feeling of Stress :   Social Connections:   . Frequency of Communication with Friends and Family:   . Frequency of Social Gatherings with Friends and Family:   . Attends Religious Services:   . Active Member of Clubs or Organizations:   . Attends Archivist Meetings:   Marland Kitchen Marital Status:      Family History: The patient's family history includes Breast cancer in her sister; Cancer in her brother; Clotting disorder in her mother; Diabetes in her brother and sister; Heart disease in her mother and sister; Liver cancer in her sister; Pancreatic cancer in her brother and sister; Pancreatic disease in her sister. ROS:   Please see the history of present illness.    All other systems reviewed and are negative.  EKGs/Labs/Other Studies Reviewed:    The following studies were reviewed today:    Recent Labs:  proBNP was low not consistent with heart failure  Ref Range & Units 1 mo ago  NT-Pro BNP 0 - 738 pg/mL 250     Most  recent INR 02/08/2020 3.0 01/26/2020: ALT 12; BUN 12; Creatinine, Ser 0.95; Hemoglobin 13.4; NT-Pro BNP 250; Platelets 295; Potassium 4.3; Sodium 144   Recent Lipid Panel she was started on prescription fish oil Lovaza    Component Value  Date/Time   CHOL 223 (H) 01/26/2020 1621   TRIG 583 (HH) 01/26/2020 1621   HDL 33 (L) 01/26/2020 1621   CHOLHDL 6.8 (H) 01/26/2020 1621   CHOLHDL 6 05/10/2014 1424   VLDL 57.0 (H) 05/10/2014 1424   LDLCALC 93 01/26/2020 1621   LDLDIRECT 113.4 05/31/2013 1425    Physical Exam:    VS:  BP 118/68   Pulse 80   Temp (!) 97 F (36.1 C)   Ht _0  (1.676 m)   Wt 196 lb (88.9 kg)   LMP  (LMP Unknown)   SpO2 94%   BMI 31.64 kg/m     Wt Readings from Last 3 Encounters:  03/02/20 196 lb (88.9 kg)  02/20/20 192 lb (87.1 kg)  01/26/20 192 lb (87.1 kg)     GEN:  Well nourished, well developed in no acute distress HEENT: Normal NECK: No JVD; No carotid bruits LYMPHATICS: No lymphadenopathy CARDIAC: RRR, no murmurs, rubs, gallops RESPIRATORY: Hyperinflated diminished breath sounds ABDOMEN: Soft, non-tender, non-distended MUSCULOSKELETAL:  No edema; No deformity  SKIN: Warm and dry NEUROLOGIC:  Alert and oriented x 3 PSYCHIATRIC:  Normal affect    Signed, Shirlee More, MD  03/02/2020 4:10 PM    [facility]

## 2020-03-15 ENCOUNTER — Telehealth: Payer: Self-pay

## 2020-03-15 MED ORDER — NITROGLYCERIN 0.4 MG SL SUBL: 0.4000 mg | SUBLINGUAL_TABLET | SUBLINGUAL | 3 refills | Status: AC | PRN

## 2020-03-15 NOTE — Telephone Encounter (Signed)
Rx sent to [facility]

## 2020-05-23 ENCOUNTER — Ambulatory Visit: Payer: Medicare HMO | Admitting: Family Medicine

## 2020-05-23 DIAGNOSIS — Z0289 Encounter for other administrative examinations: Secondary | ICD-10-CM

## 2020-11-29 ENCOUNTER — Telehealth: Payer: Self-pay | Admitting: Cardiology

## 2020-11-29 MED ORDER — FENOFIBRATE 145 MG PO TABS: 145.0000 mg | ORAL_TABLET | Freq: Every day | ORAL | 3 refills | Status: DC

## 2020-11-29 NOTE — Telephone Encounter (Signed)
Refill sent in per request.  

## 2020-11-29 NOTE — Telephone Encounter (Signed)
*  STAT* If patient is at the pharmacy, call can be transferred to refill team.   1. Which medications need to be refilled? (please list name of each medication and dose if known)  fenofibrate (TRICOR) 145 MG tablet [[id number]]    2. Which pharmacy/location (including street and city if local pharmacy) is medication to be sent to? [id number] - [place], [place] - [address] AT [place]  [address], [place] [address]  Phone:  [phone] Fax:  [phone]  3. Do they need a 30 day or 90 day supply? 90  Only 1 pill left

## 2021-02-15 ENCOUNTER — Telehealth: Payer: Self-pay | Admitting: Cardiology

## 2021-02-15 NOTE — Telephone Encounter (Signed)
New Message:     Briana Hartman from [facility] is calling to let you know pt had chest pain on 02-14-21 and had I Nitroglycerin, The Nitroglycerin took all the pain away.lShe wants you to call the pt to evaluate.

## 2021-02-18 NOTE — Telephone Encounter (Signed)
Appointment rescheduled as per pt request.

## 2021-02-18 NOTE — Telephone Encounter (Signed)
Patient states she has to reschedule her appointment. I offered her Friday but she states she another appointment that day. I did not see anything else available soon with Dr. Bettina Gavia.

## 2021-02-19 NOTE — Telephone Encounter (Signed)
Appointment rescheduled for 03/12/21. Offered to see another provider but pt declined.

## 2021-02-20 ENCOUNTER — Ambulatory Visit: Payer: Medicare HMO | Admitting: Cardiology

## 2021-02-20 ENCOUNTER — Telehealth: Payer: Self-pay | Admitting: Cardiology

## 2021-02-20 NOTE — Telephone Encounter (Signed)
Pt c/o BP issue: STAT if pt c/o blurred vision, one-sided weakness or slurred speech  1. What are your last 5 BP readings?  Monday -158/70 Tuesday -143/71 Wed -157/76  2. Are you having any other symptoms (ex. Dizziness, headache, blurred vision, passed out)? Little bit of Dizziness when she get up in the morning .  Pt stated she a  Little chest pain Monday , she took a Nitro and it went away.  Pt denies any of those symptoms right now   3. What is your BP issue? Pt state that she thinks her meds need to be adjusted   Best number [phone]

## 2021-02-20 NOTE — Telephone Encounter (Signed)
LVMTCB

## 2021-02-20 NOTE — Telephone Encounter (Signed)
I would like to transition from her low intensity losartan and start generic Micardis 40 mg daily trend blood pressures 2 weeks and reassess.  Please be sure she is using an arm cuff not measuring first in the morning good technique and rest 5 minutes before checking blood pressure and lites to twice daily

## 2021-02-21 MED ORDER — TELMISARTAN 40 MG PO TABS: 40.0000 mg | ORAL_TABLET | Freq: Every day | ORAL | 6 refills | Status: DC

## 2021-02-21 NOTE — Telephone Encounter (Signed)
Recommendations reviewed with pt as per Dr. Munley's note.  Pt verbalized understanding and had no additional questions.  

## 2021-02-21 NOTE — Addendum Note (Signed)
Addended by: Truddie Hidden on: 02/21/2021 08:17 AM   Modules accepted: Orders

## 2021-02-22 ENCOUNTER — Ambulatory Visit: Payer: Medicare HMO | Admitting: Cardiology

## 2021-03-01 DIAGNOSIS — D126 Benign neoplasm of colon, unspecified: Secondary | ICD-10-CM | POA: Insufficient documentation

## 2021-03-01 DIAGNOSIS — Z5189 Encounter for other specified aftercare: Secondary | ICD-10-CM | POA: Insufficient documentation

## 2021-03-01 DIAGNOSIS — H269 Unspecified cataract: Secondary | ICD-10-CM | POA: Insufficient documentation

## 2021-03-01 DIAGNOSIS — I517 Cardiomegaly: Secondary | ICD-10-CM | POA: Insufficient documentation

## 2021-03-01 DIAGNOSIS — D649 Anemia, unspecified: Secondary | ICD-10-CM | POA: Insufficient documentation

## 2021-03-01 DIAGNOSIS — J449 Chronic obstructive pulmonary disease, unspecified: Secondary | ICD-10-CM | POA: Insufficient documentation

## 2021-03-04 ENCOUNTER — Telehealth: Payer: Self-pay | Admitting: Cardiology

## 2021-03-04 MED ORDER — FENOFIBRATE 145 MG PO TABS: 145.0000 mg | ORAL_TABLET | Freq: Every day | ORAL | 3 refills | Status: DC

## 2021-03-04 NOTE — Telephone Encounter (Signed)
*  STAT* If patient is at the pharmacy, call can be transferred to refill team.   1. Which medications need to be refilled? (please list name of each medication and dose if known) Fenofibrate  2. Which pharmacy/location (including street and city if local pharmacy) is medication to be sent to? [facility] [address], [place]  3. Do they need a 30 day or 90 day supply? 90 days and refills

## 2021-03-04 NOTE — Telephone Encounter (Signed)
Refill sent in per request.  

## 2021-03-11 NOTE — Progress Notes (Unsigned)
Cardiology Office Note:    Date:  03/12/2021   ID:  Briana Hartman, DOB 1934/10/25, MRN [id number]  PCP:  [name], MD  Cardiologist:  Shirlee More, MD    Referring MD: [name], MD    ASSESSMENT:    1. Coronary artery disease of native artery of native heart with stable angina pectoris ([place])   2. Chronic anticoagulation   3. Hypertensive heart disease without heart failure   4. Mixed hyperlipidemia   5. Chronic obstructive pulmonary disease, unspecified COPD type ([place])    PLAN:    In order of problems listed above:  1. Stable recent PCI and drug-eluting stent she will discontinue aspirin after 2 more days and is presently taking clopidogrel plus warfarin.  She will continue clopidogrel for 6 months minimum preferably 1 month after acute coronary syndrome.  She declines cardiac rehabilitation at this time.  Continue other cardiac meds including her beta-blocker and her high intensity statin. 2. Continue long-term warfarin with hypercoagulable disorder and previous pulmonary embolism 3. Stable BP at target continue her current loop diuretic antihypertensive agents 4. Continue high intensity statin with CAD 5. Stable COPD   Next appointment: 6 months   Medication Adjustments/Labs and Tests Ordered: Current medicines are reviewed at length with the patient today.  Concerns regarding medicines are outlined above.  Orders Placed This Encounter  Procedures  . EKG 12-Lead   No orders of the defined types were placed in this encounter.   Chief Complaint  Patient presents with  . Follow-up    Recent non-ST elevation MI PCI and stent marginal branch balloon angioplasty distal LAD    History of Present Illness:    Briana Hartman is a 87 y.o. female with a hx of chest pain with a normal myocardial perfusion study, hypertensive heart disease without heart failure hyperlipidemia COPD and chronic anticoagulation with recurrent deep vein thrombosis as  well as history of pulmonary embolism hypercoagulable with factor V Leiden mutation last seen 03/02/2020. Compliance with diet, lifestyle and medications: Yes she presently is on aspirin clopidogrel and warfarin and has 2 more doses of aspirin because she drops to dual therapy. She was at [facility] in February had PCI and stent of a marginal branch implantable balloon angioplasty distal LAD for non-ST elevation MI 02/08/2021 Echocardiogram performed at that time showed preserved EF 55 to 60% with mild concentric LVH. Her son is present participates in the evaluation decision making She is not having angina she has her usual shortness of breath no palpitation or syncope. Past Medical History:  Diagnosis Date  . ACID REFLUX DISEASE   . Adenomatous colon polyp   . Allergy   . Anemia   . Anxiety   . Arthritis   . Asthma   . Blood transfusion without reported diagnosis   . Cataract   . Cervical radiculopathy   . COPD (chronic obstructive pulmonary disease) ([place])   . Diabetes mellitus   . Enlarged heart    born with  . Fundic gland polyps of stomach, benign 03/06/2015  . Hyperlipidemia   . Hyperplastic polyp of stomach 03/06/2015  . HYPERTENSION   . HYPOTHYROIDISM   . Irritable bowel syndrome   . Long term current use of anticoagulant   . Lumbar spinal stenosis   . MITRAL VALVE PROLAPSE   . Peripheral neuropathy   . 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, chronic anticaog  . Skin cancer   . VENTRAL HERNIA  Past Surgical History:  Procedure Laterality Date  . ABDOMINAL HYSTERECTOMY    . APPENDECTOMY    . CATARACT EXTRACTION Right   . CESAREAN SECTION    . CHOLECYSTECTOMY    . COLONOSCOPY  05/24/2004   interal hemorrhoids, diverticulosis  . UPPER GASTROINTESTINAL ENDOSCOPY  12/31/2007   gastric polyp  . VARICOSE VEIN SURGERY Right     Current Medications: Current Meds  Medication Sig  . albuterol (VENTOLIN HFA) 108 (90 Base) MCG/ACT inhaler Inhale into the lungs.  .  Alcohol Swabs (B-D SINGLE USE SWABS REGULAR) PADS   . amLODipine (NORVASC) 10 MG tablet   . atorvastatin (LIPITOR) 10 MG tablet Take 1 tablet (10 mg total) by mouth daily.  . Blood Glucose Monitoring Suppl (TRUE METRIX AIR GLUCOSE METER) w/Device KIT   . Elastic Bandages & Supports (ABDOMINAL BINDER/ELASTIC XL) MISC 1 Device by Does not apply route as needed (to prevent abdominal pain from hernia).  . fenofibrate (TRICOR) 145 MG tablet Take 1 tablet (145 mg total) by mouth daily.  . fluticasone (FLOVENT HFA) 220 MCG/ACT inhaler Inhale into the lungs.  . furosemide (LASIX) 40 MG tablet Take 1 tablet (40 mg total) by mouth 2 (two) times daily.  Marland Kitchen gabapentin (NEURONTIN) 100 MG capsule   . gabapentin (NEURONTIN) 300 MG capsule   . glucose blood (TRUE METRIX BLOOD GLUCOSE TEST) test strip TEST TWO TIMES DAILY  . levothyroxine (SYNTHROID, LEVOTHROID) 25 MCG tablet Take 1 tablet (25 mcg total) by mouth daily.  . metFORMIN (GLUCOPHAGE) 500 MG tablet   . metoprolol (LOPRESSOR) 50 MG tablet TAKE ONE TABLET BY MOUTH TWICE DAILY  . nitroGLYCERIN (NITROSTAT) 0.4 MG SL tablet Place 1 tablet (0.4 mg total) under the tongue every 5 (five) minutes as needed.  Marland Kitchen omeprazole (PRILOSEC) 20 MG capsule   . telmisartan (MICARDIS) 40 MG tablet Take 1 tablet (40 mg total) by mouth daily.  . TRUEplus Lancets 33G MISC   . warfarin (COUMADIN) 6 MG tablet Take as directed by anticoagulation clinic     Allergies:   Ampicillin, Aspirin, Caffeine, Codeine, Lipitor [atorvastatin], Penicillins, and Ketoconazole   Social History   Socioeconomic History  . Marital status: Married    Spouse name: Not on file  . Number of children: 3  . Years of education: Not on file  . Highest education level: Not on file  Occupational History  . 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 Smoking status: Never Smoker  . Smokeless tobacco: Never Used  Vaping Use  . Vaping Use: Never used  Substance and Sexual Activity  . Alcohol  use: No    Alcohol/week: 0.0 standard drinks  . Drug use: No  . Sexual activity: Yes  Other Topics Concern  . Not on file  Social History Narrative  . Not on file   Social Determinants of Health   Financial Resource Strain: Not on file  Food Insecurity: Not on file  Transportation Needs: Not on file  Physical Activity: Not on file  Stress: Not on file  Social Connections: Not on file     Family History: The patient's family history includes Breast cancer in her sister; Cancer in her brother; Clotting disorder in her mother; Diabetes in her brother and sister; Heart disease in her mother and sister; Liver cancer in her sister; Pancreatic cancer in her brother and sister; Pancreatic disease in her sister. ROS:   Please see the history of present illness.    All other systems reviewed  and are negative.  EKGs/Labs/Other Studies Reviewed:    The following studies were reviewed today:  EKG:  EKG [facility] 02/08/2021 showed right bundle branch block sinus rhythm left anterior hemiblock. EKG today same pattern bifascicular heart block sinus rhythm Recent Labs: No results found for requested labs within last 8760 hours.  Recent Lipid Panel    Component Value Date/Time   CHOL 223 (H) 01/26/2020 1621   TRIG 583 (HH) 01/26/2020 1621   HDL 33 (L) 01/26/2020 1621   CHOLHDL 6.8 (H) 01/26/2020 1621   CHOLHDL 6 05/10/2014 1424   VLDL 57.0 (H) 05/10/2014 1424   LDLCALC 93 01/26/2020 1621   LDLDIRECT 113.4 05/31/2013 1425    Physical Exam:    VS:  BP 122/64 (BP Location: Right Arm, Patient Position: Sitting, Cuff Size: Normal)   Pulse 68   Ht _0  (1.676 m)   Wt 191 lb 1.9 oz (86.7 kg)   LMP  (LMP Unknown)   SpO2 93%   BMI 30.85 kg/m     Wt Readings from Last 3 Encounters:  03/12/21 191 lb 1.9 oz (86.7 kg)  03/02/20 196 lb (88.9 kg)  02/20/20 192 lb (87.1 kg)     GEN:  Well nourished, well developed in no acute distress HEENT: Normal NECK: No JVD; No carotid  bruits LYMPHATICS: No lymphadenopathy CARDIAC: RRR, no murmurs, rubs, gallops RESPIRATORY:  Clear to auscultation without rales, wheezing or rhonchi  ABDOMEN: Soft, non-tender, non-distended MUSCULOSKELETAL:  No edema; No deformity  SKIN: Warm and dry NEUROLOGIC:  Alert and oriented x 3 PSYCHIATRIC:  Normal affect    Signed, Shirlee More, MD  03/12/2021 3:50 PM    [facility]

## 2021-03-12 ENCOUNTER — Encounter: Payer: Self-pay | Admitting: Cardiology

## 2021-03-12 ENCOUNTER — Other Ambulatory Visit: Payer: Self-pay

## 2021-03-12 ENCOUNTER — Ambulatory Visit: Payer: Medicare HMO | Admitting: Cardiology

## 2021-03-12 VITALS — BP 122/64 | HR 68 | Ht 66.0 in | Wt 191.1 lb

## 2021-03-12 DIAGNOSIS — J449 Chronic obstructive pulmonary disease, unspecified: Secondary | ICD-10-CM

## 2021-03-12 DIAGNOSIS — Z7901 Long term (current) use of anticoagulants: Secondary | ICD-10-CM

## 2021-03-12 DIAGNOSIS — I119 Hypertensive heart disease without heart failure: Secondary | ICD-10-CM | POA: Diagnosis not present

## 2021-03-12 DIAGNOSIS — I25118 Atherosclerotic heart disease of native coronary artery with other forms of angina pectoris: Secondary | ICD-10-CM | POA: Diagnosis not present

## 2021-03-12 DIAGNOSIS — E782 Mixed hyperlipidemia: Secondary | ICD-10-CM

## 2021-03-12 NOTE — Patient Instructions (Signed)

## 2021-03-13 DIAGNOSIS — D6851 Activated protein C resistance: Secondary | ICD-10-CM | POA: Diagnosis present

## 2021-03-20 ENCOUNTER — Ambulatory Visit: Payer: Medicare HMO | Admitting: Cardiology

## 2021-09-04 ENCOUNTER — Other Ambulatory Visit: Payer: Self-pay | Admitting: Cardiology

## 2021-09-16 ENCOUNTER — Ambulatory Visit: Payer: Medicare HMO | Admitting: Cardiology

## 2021-10-03 ENCOUNTER — Other Ambulatory Visit: Payer: Self-pay

## 2021-10-03 MED ORDER — FENOFIBRATE 145 MG PO TABS: 145.0000 mg | ORAL_TABLET | Freq: Every day | ORAL | 3 refills | Status: AC

## 2022-01-08 DIAGNOSIS — N1831 Chronic kidney disease, stage 3a: Secondary | ICD-10-CM | POA: Diagnosis present

## 2022-08-28 ENCOUNTER — Other Ambulatory Visit: Payer: Self-pay | Admitting: Cardiology

## 2022-08-28 NOTE — Telephone Encounter (Signed)
Fenofibrate Rx denied , needs appointment with Dr Bettina Gavia for refills

## 2022-09-04 ENCOUNTER — Observation Stay (HOSPITAL_BASED_OUTPATIENT_CLINIC_OR_DEPARTMENT_OTHER)
Admission: EM | Admit: 2022-09-04 | Discharge: 2022-09-07 | Disposition: A | Discharge status: Home / Self Care | Payer: Medicare HMO | Attending: Internal Medicine | Admitting: Internal Medicine

## 2022-09-04 ENCOUNTER — Emergency Department (HOSPITAL_BASED_OUTPATIENT_CLINIC_OR_DEPARTMENT_OTHER): Payer: Medicare HMO

## 2022-09-04 ENCOUNTER — Other Ambulatory Visit: Payer: Self-pay

## 2022-09-04 ENCOUNTER — Encounter (HOSPITAL_COMMUNITY): Payer: Self-pay

## 2022-09-04 DIAGNOSIS — Z955 Presence of coronary angioplasty implant and graft: Secondary | ICD-10-CM | POA: Diagnosis not present

## 2022-09-04 DIAGNOSIS — N390 Urinary tract infection, site not specified: Secondary | ICD-10-CM

## 2022-09-04 DIAGNOSIS — Z79899 Other long term (current) drug therapy: Secondary | ICD-10-CM | POA: Insufficient documentation

## 2022-09-04 DIAGNOSIS — Z85828 Personal history of other malignant neoplasm of skin: Secondary | ICD-10-CM | POA: Diagnosis not present

## 2022-09-04 DIAGNOSIS — J45909 Unspecified asthma, uncomplicated: Secondary | ICD-10-CM | POA: Insufficient documentation

## 2022-09-04 DIAGNOSIS — R29810 Facial weakness: Secondary | ICD-10-CM | POA: Diagnosis present

## 2022-09-04 DIAGNOSIS — I2510 Atherosclerotic heart disease of native coronary artery without angina pectoris: Secondary | ICD-10-CM | POA: Diagnosis not present

## 2022-09-04 DIAGNOSIS — Z86718 Personal history of other venous thrombosis and embolism: Secondary | ICD-10-CM | POA: Insufficient documentation

## 2022-09-04 DIAGNOSIS — G459 Transient cerebral ischemic attack, unspecified: Principal | ICD-10-CM | POA: Diagnosis present

## 2022-09-04 DIAGNOSIS — R4781 Slurred speech: Secondary | ICD-10-CM | POA: Insufficient documentation

## 2022-09-04 DIAGNOSIS — Z20822 Contact with and (suspected) exposure to covid-19: Secondary | ICD-10-CM | POA: Insufficient documentation

## 2022-09-04 DIAGNOSIS — H5789 Other specified disorders of eye and adnexa: Secondary | ICD-10-CM

## 2022-09-04 DIAGNOSIS — J449 Chronic obstructive pulmonary disease, unspecified: Secondary | ICD-10-CM | POA: Diagnosis present

## 2022-09-04 DIAGNOSIS — Z7984 Long term (current) use of oral hypoglycemic drugs: Secondary | ICD-10-CM | POA: Insufficient documentation

## 2022-09-04 DIAGNOSIS — H9193 Unspecified hearing loss, bilateral: Secondary | ICD-10-CM | POA: Insufficient documentation

## 2022-09-04 DIAGNOSIS — Z86711 Personal history of pulmonary embolism: Secondary | ICD-10-CM | POA: Diagnosis not present

## 2022-09-04 DIAGNOSIS — E119 Type 2 diabetes mellitus without complications: Secondary | ICD-10-CM

## 2022-09-04 DIAGNOSIS — D6851 Activated protein C resistance: Secondary | ICD-10-CM | POA: Diagnosis present

## 2022-09-04 DIAGNOSIS — I160 Hypertensive urgency: Secondary | ICD-10-CM

## 2022-09-04 DIAGNOSIS — N1831 Chronic kidney disease, stage 3a: Secondary | ICD-10-CM | POA: Diagnosis not present

## 2022-09-04 DIAGNOSIS — Z7901 Long term (current) use of anticoagulants: Secondary | ICD-10-CM | POA: Diagnosis not present

## 2022-09-04 DIAGNOSIS — I129 Hypertensive chronic kidney disease with stage 1 through stage 4 chronic kidney disease, or unspecified chronic kidney disease: Secondary | ICD-10-CM | POA: Insufficient documentation

## 2022-09-04 DIAGNOSIS — E039 Hypothyroidism, unspecified: Secondary | ICD-10-CM | POA: Diagnosis present

## 2022-09-04 DIAGNOSIS — E1122 Type 2 diabetes mellitus with diabetic chronic kidney disease: Secondary | ICD-10-CM | POA: Diagnosis not present

## 2022-09-04 DIAGNOSIS — E785 Hyperlipidemia, unspecified: Secondary | ICD-10-CM | POA: Diagnosis present

## 2022-09-04 LAB — CBC
HCT: 39.7 % (ref 36.0–46.0)
Hemoglobin: 12.3 g/dL (ref 12.0–15.0)
MCH: 27.5 pg (ref 26.0–34.0)
MCHC: 31 g/dL (ref 30.0–36.0)
MCV: 88.8 fL (ref 80.0–100.0)
Platelets: 280 10*3/uL (ref 150–400)
RBC: 4.47 MIL/uL (ref 3.87–5.11)
RDW: 14.2 % (ref 11.5–15.5)
WBC: 11.3 10*3/uL — ABNORMAL HIGH (ref 4.0–10.5)
nRBC: 0 % (ref 0.0–0.2)

## 2022-09-04 LAB — APTT: aPTT: 31 seconds (ref 24–36)

## 2022-09-04 LAB — RAPID URINE DRUG SCREEN, HOSP PERFORMED
Amphetamines: NOT DETECTED
Barbiturates: NOT DETECTED
Benzodiazepines: NOT DETECTED
Cocaine: NOT DETECTED
Opiates: NOT DETECTED
Tetrahydrocannabinol: NOT DETECTED

## 2022-09-04 LAB — COMPREHENSIVE METABOLIC PANEL
ALT: 10 U/L (ref 0–44)
AST: 18 U/L (ref 15–41)
Albumin: 3.4 g/dL — ABNORMAL LOW (ref 3.5–5.0)
Alkaline Phosphatase: 37 U/L — ABNORMAL LOW (ref 38–126)
Anion gap: 8 (ref 5–15)
BUN: 15 mg/dL (ref 8–23)
CO2: 25 mmol/L (ref 22–32)
Calcium: 9.4 mg/dL (ref 8.9–10.3)
Chloride: 105 mmol/L (ref 98–111)
Creatinine, Ser: 0.78 mg/dL (ref 0.44–1.00)
GFR, Estimated: >60 mL/min (ref >60)
Glucose, Bld: 95 mg/dL (ref 70–99)
Potassium: 4.4 mmol/L (ref 3.5–5.1)
Sodium: 138 mmol/L (ref 135–145)
Total Bilirubin: 0.5 mg/dL (ref 0.3–1.2)
Total Protein: 6.1 g/dL — ABNORMAL LOW (ref 6.5–8.1)

## 2022-09-04 LAB — URINALYSIS, ROUTINE W REFLEX MICROSCOPIC
Bilirubin Urine: NEGATIVE
Glucose, UA: NEGATIVE mg/dL
Ketones, ur: NEGATIVE mg/dL
Leukocytes,Ua: NEGATIVE
Nitrite: POSITIVE — AB
Protein, ur: NEGATIVE mg/dL
Specific Gravity, Urine: 1.015 (ref 1.005–1.030)
pH: 8.5 — ABNORMAL HIGH (ref 5.0–8.0)

## 2022-09-04 LAB — DIFFERENTIAL
Abs Immature Granulocytes: 0.08 10*3/uL — ABNORMAL HIGH (ref 0.00–0.07)
Basophils Absolute: 0.1 10*3/uL (ref 0.0–0.1)
Basophils Relative: 1 %
Eosinophils Absolute: 0.2 10*3/uL (ref 0.0–0.5)
Eosinophils Relative: 2 %
Immature Granulocytes: 1 %
Lymphocytes Relative: 27 %
Lymphs Abs: 3.1 10*3/uL (ref 0.7–4.0)
Monocytes Absolute: 1 10*3/uL (ref 0.1–1.0)
Monocytes Relative: 9 %
Neutro Abs: 6.8 10*3/uL (ref 1.7–7.7)
Neutrophils Relative %: 60 %

## 2022-09-04 LAB — CBG MONITORING, ED: Glucose-Capillary: 95 mg/dL (ref 70–99)

## 2022-09-04 LAB — URINALYSIS, MICROSCOPIC (REFLEX)

## 2022-09-04 LAB — RESP PANEL BY RT-PCR (FLU A&B, COVID) ARPGX2
Influenza A by PCR: NEGATIVE
Influenza B by PCR: NEGATIVE
SARS Coronavirus 2 by RT PCR: NEGATIVE

## 2022-09-04 LAB — PROTIME-INR
INR: 1.6 — ABNORMAL HIGH (ref 0.8–1.2)
Prothrombin Time: 19.2 seconds — ABNORMAL HIGH (ref 11.4–15.2)

## 2022-09-04 LAB — TROPONIN I (HIGH SENSITIVITY)
Troponin I (High Sensitivity): 13 ng/L (ref <18)
Troponin I (High Sensitivity): 13 ng/L (ref <18)

## 2022-09-04 LAB — ETHANOL: Alcohol, Ethyl (B): <10 mg/dL (ref <10)

## 2022-09-04 MED ORDER — SODIUM CHLORIDE 0.9 % IV SOLN
1.0000 g | Freq: Once | INTRAVENOUS | Status: AC
  Administered 2022-09-04: 1 g via INTRAVENOUS
  Filled 2022-09-04: qty 10

## 2022-09-04 MED ORDER — IOHEXOL 350 MG/ML SOLN
75.0000 mL | Freq: Once | INTRAVENOUS | Status: AC | PRN
  Administered 2022-09-04: 75 mL via INTRAVENOUS

## 2022-09-04 MED ORDER — HYDRALAZINE HCL 20 MG/ML IJ SOLN
5.0000 mg | Freq: Once | INTRAMUSCULAR | Status: AC
  Administered 2022-09-04: 5 mg via INTRAVENOUS
  Filled 2022-09-04: qty 1

## 2022-09-04 MED ORDER — METOPROLOL TARTRATE 50 MG PO TABS
50.0000 mg | ORAL_TABLET | Freq: Two times a day (BID) | ORAL | Status: DC
  Administered 2022-09-05 (×2): 50 mg via ORAL
  Filled 2022-09-04 (×2): qty 2

## 2022-09-04 MED ORDER — ONDANSETRON HCL 4 MG/2ML IJ SOLN
4.0000 mg | Freq: Once | INTRAMUSCULAR | Status: AC
  Administered 2022-09-04: 4 mg via INTRAVENOUS
  Filled 2022-09-04: qty 2

## 2022-09-04 MED ORDER — LEVOTHYROXINE SODIUM 25 MCG PO TABS
25.0000 ug | ORAL_TABLET | Freq: Every day | ORAL | Status: DC
  Administered 2022-09-05: 25 ug via ORAL
  Filled 2022-09-04: qty 1

## 2022-09-04 NOTE — ED Notes (Signed)
MD and RN updated pt and family about admission and admission process. Pt eating and drinking ginger ale and crackers at this time. Denies other needs at this time. MD aware of pt headache and BP, pulse ox sensor changed to ear probe due to pt finger being uncomfortable.

## 2022-09-04 NOTE — ED Notes (Signed)
Nausea meds given, family and pt updated about test results being back except for urine specimen but RN collected sample at this time. Awaiting update to family and pt by MD when UA test results come back.

## 2022-09-04 NOTE — ED Provider Notes (Signed)
[facility]ENC[facility] DEPARTMENT Provider Note   CSN: [id number] Arrival date & time: 09/04/22  1545     History {Add pertinent medical, surgical, social history, OB history to HPI:1} Chief Complaint  Patient presents with  . Facial Droop    Briana Hartman is a 88 y.o. female.  HPI     88 year old female with a history of coronary artery disease with history of NSTEMI in February 2022, hyperlipidemia, hypertension, factor V Leiden mutation heterozygous with history of PE on Coumadin, COPD, type 2 diabetes, whose husband died 1 month ago, who presents with concern for episode of right facial droop and dysarthria.  Family reports that she was in her normal state of health at 2:45 PM, and he looked over at her in the car around 3 PM and saw her to have right sided facial weakness and dysarthria.  She reported it felt like something, like a small dog was crawling all over the right side of her chest.  She had told her son at the time that it felt like her right leg was way down by a cinderblock.  He did not note any sign of right-sided weakness with the exception of the right facial droop.  Now the patient reports that she felt symptoms on the left.  She reports some history of thyroid problems affecting her right eye vision and that at baseline she does not have good vision in this eye and denies any acute change.  She has had a right-sided headache that occurred around the same time.  They have noted that her blood pressures have been higher at home but thought maybe it was due to the blood pressure machine being off.  She did take her blood pressure medications today.  Denies nausea, vomiting, fever, increased cough, abdominal pain.  They report of the last few days she has had increased shortness of breath.  She reports that the right side of her chest felt like bubbles or something on her, did not feel like her prior MI.  Family agrees at this time she does not appear to have  the facial droop or speech changes that she had had earlier in the car. It is unclear how long this lasted, at most 30 minutes.  Home Medications Prior to Admission medications   Medication Sig Start Date End Date Taking? Authorizing Provider  albuterol (VENTOLIN HFA) 108 (90 Base) MCG/ACT inhaler Inhale into the lungs. 12/08/19   [provider]  Alcohol Swabs (B-D SINGLE USE SWABS REGULAR) PADS  11/15/19   [provider]  amLODipine (NORVASC) 10 MG tablet  01/18/20   [provider]  atorvastatin (LIPITOR) 10 MG tablet Take 1 tablet (10 mg total) by mouth daily. 05/10/14   Biagio Borg, MD  Blood Glucose Monitoring Suppl (TRUE METRIX AIR GLUCOSE METER) w/Device KIT  07/04/19   [provider]  Elastic Bandages & Supports (ABDOMINAL BINDER/ELASTIC XL) MISC 1 Device by Does not apply route as needed (to prevent abdominal pain from hernia). 03/02/15   Gatha Mayer, MD  fenofibrate (TRICOR) 145 MG tablet Take 1 tablet (145 mg total) by mouth daily. 10/03/21   Richardo Priest, MD  fluticasone (FLOVENT HFA) 220 MCG/ACT inhaler Inhale into the lungs. 10/26/18   [provider]  furosemide (LASIX) 40 MG tablet Take 1 tablet (40 mg total) by mouth 2 (two) times daily. 01/10/14   Biagio Borg, MD  gabapentin (NEURONTIN) 100 MG capsule  11/20/19   [provider]  gabapentin (NEURONTIN) 300 MG capsule  01/03/20   [provider]  glucose blood (TRUE METRIX BLOOD GLUCOSE TEST) test strip TEST TWO TIMES DAILY 06/23/19   [provider]  levothyroxine (SYNTHROID, LEVOTHROID) 25 MCG tablet Take 1 tablet (25 mcg total) by mouth daily. 09/28/14   Biagio Borg, MD  metFORMIN (GLUCOPHAGE) 500 MG tablet  01/18/20   [provider]  metoprolol (LOPRESSOR) 50 MG tablet TAKE ONE TABLET BY MOUTH TWICE DAILY 11/29/13   Biagio Borg, MD  nitroGLYCERIN (NITROSTAT) 0.4 MG SL tablet Place 1 tablet (0.4 mg total) under the tongue every 5 (five)  minutes as needed. 03/15/20   Richardo Priest, MD  omeprazole (PRILOSEC) 20 MG capsule  12/26/19   [provider]  telmisartan (MICARDIS) 40 MG tablet Take 1 tablet (40 mg total) by mouth daily. 02/21/21   Richardo Priest, MD  TRUEplus Lancets 33G MISC  11/15/19   [provider]  warfarin (COUMADIN) 6 MG tablet Take as directed by anticoagulation clinic 07/12/14   Biagio Borg, MD      Allergies    Ampicillin, Aspirin, Caffeine, Codeine, Lipitor [atorvastatin], Penicillins, and Ketoconazole    Review of Systems   Review of Systems  Physical Exam Updated Vital Signs BP (!) 221/99   Pulse 64   Temp 98.2 F (36.8 C) (Oral)   Resp (!) 24   Ht 5' 6"  (1.676 m)   Wt 82.6 kg   LMP  (LMP Unknown)   SpO2 93%   BMI 29.41 kg/m  Physical Exam  ED Results / Procedures / Treatments   Labs (all labs ordered are listed, but only abnormal results are displayed) Labs Reviewed  PROTIME-INR - Abnormal; Notable for the following components:      Result Value   Prothrombin Time 19.2 (*)    INR 1.6 (*)    All other components within normal limits  CBC - Abnormal; Notable for the following components:   WBC 11.3 (*)    All other components within normal limits  DIFFERENTIAL - Abnormal; Notable for the following components:   Abs Immature Granulocytes 0.08 (*)    All other components within normal limits  COMPREHENSIVE METABOLIC PANEL - Abnormal; Notable for the following components:   Total Protein 6.1 (*)    Albumin 3.4 (*)    Alkaline Phosphatase 37 (*)    All other components within normal limits  RESP PANEL BY RT-PCR (FLU A&B, COVID) ARPGX2  ETHANOL  APTT  RAPID URINE DRUG SCREEN, HOSP PERFORMED  URINALYSIS, ROUTINE W REFLEX MICROSCOPIC  CBG MONITORING, ED  TROPONIN I (HIGH SENSITIVITY)    EKG None  Radiology DG Chest Portable 1 View  Result Date: 09/04/2022 CLINICAL DATA:  Chest pain EXAM: PORTABLE CHEST 1 VIEW COMPARISON:  05/31/2013, 02/22/2021 FINDINGS:  Cardiomegaly. Aortic atherosclerosis. Central pulmonary vessels appears slightly enlarged. No pleural effusion or pneumothorax. IMPRESSION: 1. Cardiomegaly without edema or pleural effusion 2. Prominent central pulmonary vessels suggest arterial hypertension Electronically Signed   By: Donavan Foil M.D.   On: 09/04/2022 16:56    Procedures Procedures  {Document cardiac monitor, telemetry assessment procedure when appropriate:1}  Medications Ordered in ED Medications  iohexol (OMNIPAQUE) 350 MG/ML injection 75 mL (has no administration in time range)    ED Course/ Medical Decision Making/ A&P  88 year old female with a history of coronary artery disease with history of NSTEMI in February 2022, hyperlipidemia, hypertension, factor V Leiden mutation heterozygous with history of PE on Coumadin, COPD, type 2 diabetes, whose husband died 15 month ago, who presents with concern for episode of right facial droop and dysarthria.   At time of my evaluation, no sign of persistent   {Document critical care time when appropriate:1} {Document review of labs and clinical decision tools ie heart score, Chads2Vasc2 etc:1}  {Document your independent review of radiology images, and any outside records:1} {Document your discussion with family members, caretakers, and with consultants:1} {Document social determinants of health affecting pt's care:1} {Document your decision making why or why not admission, treatments were needed:1} Final Clinical Impression(s) / ED Diagnoses Final diagnoses:  None    Rx / DC Orders ED Discharge Orders     None

## 2022-09-04 NOTE — ED Provider Notes (Incomplete)
[facility] DEPARTMENT Provider Note   CSN: [id number] Arrival date & time: 09/04/22  1545     History {Add pertinent medical, surgical, social history, OB history to HPI:1} Chief Complaint  Patient presents with  . Facial Droop    Briana Hartman is a 88 y.o. female.  HPI     88 year old female with a history of coronary artery disease with history of NSTEMI in February 2022, hyperlipidemia, hypertension, factor V Leiden mutation heterozygous with history of PE on Coumadin, COPD, type 2 diabetes, whose husband died 1 month ago, who presents with concern for episode of right facial droop and dysarthria.  Family reports that she was in her normal state of health at 2:45 PM, and he looked over at her in the car around 3 PM and saw her to have right sided facial weakness and dysarthria.  She reported it felt like something, like a small dog was crawling all over the right side of her chest.  She had told her son at the time that it felt like her right leg was way down by a cinderblock.  He did not note any sign of right-sided weakness with the exception of the right facial droop.  Now the patient reports that she felt symptoms on the left.  She reports some history of thyroid problems affecting her right eye vision and that at baseline she does not have good vision in this eye and denies any acute change.  She has had a right-sided headache that occurred around the same time.  They have noted that her blood pressures have been higher at home but thought maybe it was due to the blood pressure machine being off.  She did take her blood pressure medications today.  Denies nausea, vomiting, fever, increased cough, abdominal pain.  They report of the last few days she has had increased shortness of breath.  She reports that the right side of her chest felt like bubbles or something on her, did not feel like her prior MI.  Family agrees at this time she does not appear to have  the facial droop or speech changes that she had had earlier in the car. It is unclear how long this lasted, at most 30 minutes.  Home Medications Prior to Admission medications   Medication Sig Start Date End Date Taking? Authorizing Provider  albuterol (VENTOLIN HFA) 108 (90 Base) MCG/ACT inhaler Inhale into the lungs. 12/08/19   [provider]  Alcohol Swabs (B-D SINGLE USE SWABS REGULAR) PADS  11/15/19   [provider]  amLODipine (NORVASC) 10 MG tablet  01/18/20   [provider]  atorvastatin (LIPITOR) 10 MG tablet Take 1 tablet (10 mg total) by mouth daily. 05/10/14   Biagio Borg, MD  Blood Glucose Monitoring Suppl (TRUE METRIX AIR GLUCOSE METER) w/Device KIT  07/04/19   [provider]  Elastic Bandages & Supports (ABDOMINAL BINDER/ELASTIC XL) MISC 1 Device by Does not apply route as needed (to prevent abdominal pain from hernia). 03/02/15   Gatha Mayer, MD  fenofibrate (TRICOR) 145 MG tablet Take 1 tablet (145 mg total) by mouth daily. 10/03/21   Richardo Priest, MD  fluticasone (FLOVENT HFA) 220 MCG/ACT inhaler Inhale into the lungs. 10/26/18   [provider]  furosemide (LASIX) 40 MG tablet Take 1 tablet (40 mg total) by mouth 2 (two) times daily. 01/10/14   Biagio Borg, MD  gabapentin (NEURONTIN) 100 MG capsule  11/20/19   [provider]  gabapentin (NEURONTIN) 300 MG capsule  01/03/20   [provider]  glucose blood (TRUE METRIX BLOOD GLUCOSE TEST) test strip TEST TWO TIMES DAILY 06/23/19   [provider]  levothyroxine (SYNTHROID, LEVOTHROID) 25 MCG tablet Take 1 tablet (25 mcg total) by mouth daily. 09/28/14   Biagio Borg, MD  metFORMIN (GLUCOPHAGE) 500 MG tablet  01/18/20   [provider]  metoprolol (LOPRESSOR) 50 MG tablet TAKE ONE TABLET BY MOUTH TWICE DAILY 11/29/13   Biagio Borg, MD  nitroGLYCERIN (NITROSTAT) 0.4 MG SL tablet Place 1 tablet (0.4 mg total) under the tongue every 5 (five)  minutes as needed. 03/15/20   Richardo Priest, MD  omeprazole (PRILOSEC) 20 MG capsule  12/26/19   [provider]  telmisartan (MICARDIS) 40 MG tablet Take 1 tablet (40 mg total) by mouth daily. 02/21/21   Richardo Priest, MD  TRUEplus Lancets 33G MISC  11/15/19   [provider]  warfarin (COUMADIN) 6 MG tablet Take as directed by anticoagulation clinic 07/12/14   Biagio Borg, MD      Allergies    Ampicillin, Aspirin, Caffeine, Codeine, Lipitor [atorvastatin], Penicillins, and Ketoconazole    Review of Systems   Review of Systems  Physical Exam Updated Vital Signs BP (!) 221/99   Pulse 64   Temp 98.2 F (36.8 C) (Oral)   Resp (!) 24   Ht $R'5\' 6"'rv$  (1.676 m)   Wt 82.6 kg   LMP  (LMP Unknown)   SpO2 93%   BMI 29.41 kg/m  Physical Exam Vitals and nursing note reviewed.  Constitutional:      General: She is not in acute distress.    Appearance: She is well-developed. She is not diaphoretic.  HENT:     Head: Normocephalic and atraumatic.  Eyes:     General: No visual field deficit (poor vision in right eye at baseline).    Conjunctiva/sclera: Conjunctivae normal.  Cardiovascular:     Rate and Rhythm: Normal rate and regular rhythm.     Heart sounds: Normal heart sounds. No murmur heard.    No friction rub. No gallop.  Pulmonary:     Effort: Pulmonary effort is normal. No respiratory distress.     Breath sounds: Normal breath sounds. No wheezing or rales.  Abdominal:     General: There is no distension.     Palpations: Abdomen is soft.     Tenderness: There is no abdominal tenderness. There is no guarding.  Musculoskeletal:        General: No tenderness.     Cervical back: Normal range of motion.  Skin:    General: Skin is warm and dry.     Findings: No erythema or rash.  Neurological:     Mental Status: She is alert and oriented to person, place, and time.     Cranial Nerves: No dysarthria. Facial asymmetry: initially did not smil bilaterally and then did  with symmetry.    ED Results / Procedures / Treatments   Labs (all labs ordered are listed, but only abnormal results are displayed) Labs Reviewed  PROTIME-INR - Abnormal; Notable for the following components:      Result Value   Prothrombin Time 19.2 (*)    INR 1.6 (*)    All other components within normal limits  CBC - Abnormal; Notable for the following components:   WBC 11.3 (*)    All other components within normal limits  DIFFERENTIAL - Abnormal;  Notable for the following components:   Abs Immature Granulocytes 0.08 (*)    All other components within normal limits  COMPREHENSIVE METABOLIC PANEL - Abnormal; Notable for the following components:   Total Protein 6.1 (*)    Albumin 3.4 (*)    Alkaline Phosphatase 37 (*)    All other components within normal limits  RESP PANEL BY RT-PCR (FLU A&B, COVID) ARPGX2  ETHANOL  APTT  RAPID URINE DRUG SCREEN, HOSP PERFORMED  URINALYSIS, ROUTINE W REFLEX MICROSCOPIC  CBG MONITORING, ED  TROPONIN I (HIGH SENSITIVITY)    EKG None  Radiology DG Chest Portable 1 View  Result Date: 09/04/2022 CLINICAL DATA:  Chest pain EXAM: PORTABLE CHEST 1 VIEW COMPARISON:  05/31/2013, 02/22/2021 FINDINGS: Cardiomegaly. Aortic atherosclerosis. Central pulmonary vessels appears slightly enlarged. No pleural effusion or pneumothorax. IMPRESSION: 1. Cardiomegaly without edema or pleural effusion 2. Prominent central pulmonary vessels suggest arterial hypertension Electronically Signed   By: Donavan Foil M.D.   On: 09/04/2022 16:56    Procedures Procedures  {Document cardiac monitor, telemetry assessment procedure when appropriate:1}  Medications Ordered in ED Medications  iohexol (OMNIPAQUE) 350 MG/ML injection 75 mL (has no administration in time range)    ED Course/ Medical Decision Making/ A&P                            88 year old female with a history of coronary artery disease with history of NSTEMI in February 2022, hyperlipidemia,  hypertension, factor V Leiden mutation heterozygous with history of PE on Coumadin, COPD, type 2 diabetes, whose husband died 57 month ago, who presents with concern for episode of right facial droop and dysarthria.   At time of my evaluation, no sign of persistent neurologic deficit, do not feel Code Stroke is appropriate as she is not an appropriate tnk candidate. Clinically have concern for TIA with facial droop and speech changes that resolved prior to my evaluation and lasted approximately 30 min.  DDx includes TIA, CVA< ICH, electrolyte abnormality, seizure, ACS.  CTA head/neck ordered to evalaute for other vascular changes show no evidence of LVO, does show intracranial atherosclerosis, mild cervial artery atherosclerosis. 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  She also described symptoms of "bubbles" or something crawling on the right side of her chest. Troponin negative x2, doubt ACS.  Dyspnea slightly worse. Is not in distress, no sign of pneumonia/edema.  Has chronic hypoxia reporting she was previously on O2 but this was stopped--reports values vary from 85-90% at home, has had values 88-92 here.  Suspect this is due to underlying COPD?pulmonary hypertension. Has hx of prior PE, factor V and INR is subtherapeutic at this time--however do not wish to start anticoagulation at this time with possibility of acute stroke and lower suspicion for acute PE.  Hypertensive to greater than 220, symptoms resolved while BP still high--do not feel symptoms represent hypertensive encephalopathy. Treated BP over 220 and otherwise will allow permissive hypertension.   Urine concerning for possible UTI, will treat with rocephin.  Other labs personally evaluated and interpreted by me show no significant electrolyte abnormality or anemia.    Discussed with Dr. Leonel Ramsay Neurology.  Will admit for TIA work up.     {Document critical care time when appropriate:1} {Document review of labs and clinical decision tools ie heart  score, Chads2Vasc2 etc:1}  {Document your independent review of radiology images, and any outside records:1} {Document your discussion with family members, caretakers, and with consultants:1} {Document social determinants of health affecting pt's care:1} {Document your decision making why or why not admission, treatments were needed:1} Final Clinical Impression(s) / ED Diagnoses Final diagnoses:  None    Rx / DC Orders ED Discharge Orders     None

## 2022-09-04 NOTE — ED Triage Notes (Signed)
Per patients son - Patient had a witnessed new onset right sided facial droop with slurred speech. Complains of right sided weakness.  Patient c/o right eye pain. [id number] by son  MD at bedside at this time   Patient failed PO swallow screen

## 2022-09-05 ENCOUNTER — Observation Stay (HOSPITAL_COMMUNITY): Payer: Medicare HMO

## 2022-09-05 DIAGNOSIS — I2510 Atherosclerotic heart disease of native coronary artery without angina pectoris: Secondary | ICD-10-CM | POA: Diagnosis not present

## 2022-09-05 DIAGNOSIS — H5789 Other specified disorders of eye and adnexa: Secondary | ICD-10-CM

## 2022-09-05 DIAGNOSIS — G459 Transient cerebral ischemic attack, unspecified: Secondary | ICD-10-CM | POA: Diagnosis not present

## 2022-09-05 DIAGNOSIS — E039 Hypothyroidism, unspecified: Secondary | ICD-10-CM | POA: Diagnosis not present

## 2022-09-05 DIAGNOSIS — N390 Urinary tract infection, site not specified: Secondary | ICD-10-CM | POA: Diagnosis not present

## 2022-09-05 DIAGNOSIS — N1831 Chronic kidney disease, stage 3a: Secondary | ICD-10-CM | POA: Diagnosis not present

## 2022-09-05 DIAGNOSIS — Z79899 Other long term (current) drug therapy: Secondary | ICD-10-CM | POA: Diagnosis not present

## 2022-09-05 DIAGNOSIS — I160 Hypertensive urgency: Secondary | ICD-10-CM

## 2022-09-05 DIAGNOSIS — R29810 Facial weakness: Secondary | ICD-10-CM | POA: Diagnosis present

## 2022-09-05 DIAGNOSIS — Z7901 Long term (current) use of anticoagulants: Secondary | ICD-10-CM | POA: Diagnosis not present

## 2022-09-05 DIAGNOSIS — Z20822 Contact with and (suspected) exposure to covid-19: Secondary | ICD-10-CM | POA: Diagnosis not present

## 2022-09-05 DIAGNOSIS — Z86711 Personal history of pulmonary embolism: Secondary | ICD-10-CM | POA: Diagnosis not present

## 2022-09-05 DIAGNOSIS — Z7984 Long term (current) use of oral hypoglycemic drugs: Secondary | ICD-10-CM | POA: Diagnosis not present

## 2022-09-05 DIAGNOSIS — Z85828 Personal history of other malignant neoplasm of skin: Secondary | ICD-10-CM | POA: Diagnosis not present

## 2022-09-05 DIAGNOSIS — Z86718 Personal history of other venous thrombosis and embolism: Secondary | ICD-10-CM | POA: Diagnosis not present

## 2022-09-05 DIAGNOSIS — Z955 Presence of coronary angioplasty implant and graft: Secondary | ICD-10-CM | POA: Diagnosis not present

## 2022-09-05 DIAGNOSIS — J449 Chronic obstructive pulmonary disease, unspecified: Secondary | ICD-10-CM | POA: Diagnosis not present

## 2022-09-05 DIAGNOSIS — H9193 Unspecified hearing loss, bilateral: Secondary | ICD-10-CM | POA: Diagnosis not present

## 2022-09-05 DIAGNOSIS — E1122 Type 2 diabetes mellitus with diabetic chronic kidney disease: Secondary | ICD-10-CM | POA: Diagnosis not present

## 2022-09-05 DIAGNOSIS — J45909 Unspecified asthma, uncomplicated: Secondary | ICD-10-CM | POA: Diagnosis not present

## 2022-09-05 DIAGNOSIS — I129 Hypertensive chronic kidney disease with stage 1 through stage 4 chronic kidney disease, or unspecified chronic kidney disease: Secondary | ICD-10-CM | POA: Diagnosis not present

## 2022-09-05 LAB — GLUCOSE, CAPILLARY
Glucose-Capillary: 109 mg/dL — ABNORMAL HIGH (ref 70–99)
Glucose-Capillary: 110 mg/dL — ABNORMAL HIGH (ref 70–99)

## 2022-09-05 LAB — CBG MONITORING, ED: Glucose-Capillary: 124 mg/dL — ABNORMAL HIGH (ref 70–99)

## 2022-09-05 MED ORDER — METOPROLOL SUCCINATE ER 50 MG PO TB24
150.0000 mg | ORAL_TABLET | Freq: Every day | ORAL | Status: DC
  Administered 2022-09-06: 150 mg via ORAL
  Filled 2022-09-05: qty 1

## 2022-09-05 MED ORDER — HYDRALAZINE HCL 20 MG/ML IJ SOLN: 10.0000 mg | Freq: Three times a day (TID) | INTRAMUSCULAR | Status: DC | PRN

## 2022-09-05 MED ORDER — ACETAMINOPHEN 325 MG PO TABS
650.0000 mg | ORAL_TABLET | ORAL | Status: DC | PRN
  Administered 2022-09-05 – 2022-09-06 (×2): 650 mg via ORAL
  Filled 2022-09-05 (×2): qty 2

## 2022-09-05 MED ORDER — CLOPIDOGREL BISULFATE 75 MG PO TABS
75.0000 mg | ORAL_TABLET | Freq: Every day | ORAL | Status: DC
  Administered 2022-09-05: 75 mg via ORAL
  Filled 2022-09-05: qty 1

## 2022-09-05 MED ORDER — ACETAMINOPHEN 160 MG/5ML PO SOLN: 650.0000 mg | ORAL | Status: DC | PRN

## 2022-09-05 MED ORDER — ISOSORBIDE MONONITRATE ER 60 MG PO TB24
60.0000 mg | ORAL_TABLET | Freq: Every day | ORAL | Status: DC
  Administered 2022-09-05 – 2022-09-07 (×3): 60 mg via ORAL
  Filled 2022-09-05 (×3): qty 1

## 2022-09-05 MED ORDER — FENTANYL CITRATE PF 50 MCG/ML IJ SOSY
50.0000 ug | PREFILLED_SYRINGE | Freq: Once | INTRAMUSCULAR | Status: AC
  Administered 2022-09-05: 50 ug via INTRAVENOUS
  Filled 2022-09-05: qty 1

## 2022-09-05 MED ORDER — ACETAMINOPHEN 325 MG PO TABS
650.0000 mg | ORAL_TABLET | Freq: Once | ORAL | Status: AC
  Administered 2022-09-05: 650 mg via ORAL
  Filled 2022-09-05: qty 2

## 2022-09-05 MED ORDER — INSULIN ASPART 100 UNIT/ML IJ SOLN: 0.0000 [IU] | Freq: Three times a day (TID) | INTRAMUSCULAR | Status: DC

## 2022-09-05 MED ORDER — FLUTICASONE FUROATE-VILANTEROL 100-25 MCG/ACT IN AEPB
1.0000 | INHALATION_SPRAY | Freq: Every day | RESPIRATORY_TRACT | Status: DC
  Administered 2022-09-07: 1 via RESPIRATORY_TRACT
  Filled 2022-09-05 (×2): qty 28

## 2022-09-05 MED ORDER — HYDRALAZINE HCL 20 MG/ML IJ SOLN
10.0000 mg | Freq: Once | INTRAMUSCULAR | Status: AC
  Administered 2022-09-05: 10 mg via INTRAVENOUS
  Filled 2022-09-05: qty 1

## 2022-09-05 MED ORDER — ONDANSETRON HCL 4 MG/2ML IJ SOLN
4.0000 mg | Freq: Once | INTRAMUSCULAR | Status: AC
  Administered 2022-09-05: 4 mg via INTRAVENOUS
  Filled 2022-09-05: qty 2

## 2022-09-05 MED ORDER — PRAVASTATIN SODIUM 40 MG PO TABS
40.0000 mg | ORAL_TABLET | Freq: Every day | ORAL | Status: DC
  Administered 2022-09-05: 40 mg via ORAL
  Filled 2022-09-05: qty 1

## 2022-09-05 MED ORDER — FUROSEMIDE 40 MG PO TABS
40.0000 mg | ORAL_TABLET | Freq: Every day | ORAL | Status: DC
  Administered 2022-09-06 – 2022-09-07 (×2): 40 mg via ORAL
  Filled 2022-09-05 (×2): qty 1

## 2022-09-05 MED ORDER — SODIUM CHLORIDE 0.9 % IV SOLN
1.0000 g | INTRAVENOUS | Status: DC
  Administered 2022-09-05 – 2022-09-06 (×2): 1 g via INTRAVENOUS
  Filled 2022-09-05 (×3): qty 10

## 2022-09-05 MED ORDER — WARFARIN SODIUM 7.5 MG PO TABS
7.5000 mg | ORAL_TABLET | Freq: Once | ORAL | Status: AC
  Administered 2022-09-05: 7.5 mg via ORAL
  Filled 2022-09-05: qty 1

## 2022-09-05 MED ORDER — ONDANSETRON HCL 4 MG/2ML IJ SOLN
4.0000 mg | Freq: Three times a day (TID) | INTRAMUSCULAR | Status: DC | PRN
  Administered 2022-09-05: 4 mg via INTRAVENOUS
  Filled 2022-09-05: qty 2

## 2022-09-05 MED ORDER — IRBESARTAN 300 MG PO TABS
150.0000 mg | ORAL_TABLET | Freq: Every day | ORAL | Status: DC
  Administered 2022-09-06 – 2022-09-07 (×2): 150 mg via ORAL
  Filled 2022-09-05 (×2): qty 1

## 2022-09-05 MED ORDER — FUROSEMIDE 40 MG PO TABS: 40.0000 mg | ORAL_TABLET | Freq: Every day | ORAL | Status: DC

## 2022-09-05 MED ORDER — GABAPENTIN 300 MG PO CAPS
300.0000 mg | ORAL_CAPSULE | Freq: Three times a day (TID) | ORAL | Status: DC
  Administered 2022-09-05 – 2022-09-07 (×5): 300 mg via ORAL
  Filled 2022-09-05 (×5): qty 1

## 2022-09-05 MED ORDER — EZETIMIBE 10 MG PO TABS
10.0000 mg | ORAL_TABLET | Freq: Every day | ORAL | Status: DC
  Administered 2022-09-05 – 2022-09-07 (×3): 10 mg via ORAL
  Filled 2022-09-05 (×3): qty 1

## 2022-09-05 MED ORDER — LEVOTHYROXINE SODIUM 50 MCG PO TABS
50.0000 ug | ORAL_TABLET | Freq: Every day | ORAL | Status: DC
  Administered 2022-09-06 – 2022-09-07 (×2): 50 ug via ORAL
  Filled 2022-09-05 (×3): qty 1

## 2022-09-05 MED ORDER — FENOFIBRATE 160 MG PO TABS
160.0000 mg | ORAL_TABLET | Freq: Every day | ORAL | Status: DC
  Administered 2022-09-06 – 2022-09-07 (×2): 160 mg via ORAL
  Filled 2022-09-05 (×2): qty 1

## 2022-09-05 MED ORDER — ACETAMINOPHEN 650 MG RE SUPP: 650.0000 mg | RECTAL | Status: DC | PRN

## 2022-09-05 MED ORDER — WARFARIN - PHARMACIST DOSING INPATIENT: Freq: Every day | Status: DC

## 2022-09-05 MED ORDER — PANTOPRAZOLE SODIUM 40 MG PO TBEC
40.0000 mg | DELAYED_RELEASE_TABLET | Freq: Every day | ORAL | Status: DC
  Administered 2022-09-06 – 2022-09-07 (×2): 40 mg via ORAL
  Filled 2022-09-05 (×2): qty 1

## 2022-09-05 MED ORDER — STROKE: EARLY STAGES OF RECOVERY BOOK
Freq: Once | Status: AC
  Filled 2022-09-05: qty 1

## 2022-09-05 MED ORDER — AMLODIPINE BESYLATE 10 MG PO TABS
10.0000 mg | ORAL_TABLET | Freq: Every day | ORAL | Status: DC
  Administered 2022-09-06 – 2022-09-07 (×2): 10 mg via ORAL
  Filled 2022-09-05 (×2): qty 1

## 2022-09-05 MED ORDER — SENNOSIDES-DOCUSATE SODIUM 8.6-50 MG PO TABS: 1.0000 | ORAL_TABLET | Freq: Every evening | ORAL | Status: DC | PRN

## 2022-09-05 NOTE — Assessment & Plan Note (Signed)
Stable, no signs of exacerbation Continue flovent daily and SABA prn

## 2022-09-05 NOTE — H&P (Signed)
History and Physical    Patient: Briana Hartman BUL:[id number] DOB: 21-Aug-1934 DOA: 09/04/2022 DOS: the patient was seen and examined on 09/05/2022 PCP: [name], MD  Patient coming from:  [facility]  - lives with her son. Uses walker.    Chief Complaint: right sided facial droop and dysarthria   HPI: Briana Hartman is a 88 y.o. female with medical history significant of factor V leiden mutation with hx of PE, T2DM, CAD with PCI in 2022, HTN, HLD, CKD stage 3a, hypothyroidism, COPD, GAD who presented to ED with complaints of right sided facial droop and dysarthria lasting for about 30 minutes. Her son states around 2:30 in the afternoon they were in line to get medicine at [facility] and she had sudden right sided facial droop and she had dysarthric speech with pain down the right side of her body. She was unable to walk or stand out of her son's truck when they got to ED which is abnormal for her.   Her husband passed away four weeks ago and has been living with her son and daughter in law since that time. They were married for 68 years.   She has had a dull headache, but states this is not atypical.   She has had some dysuria x 2 days and increased frequency yesterday.   He has been feeling good. Denies any fever/chills, vision changes, chest pain or palpitations, shortness of breath or cough, abdominal pain, N/V/D, or leg swelling.   She does not smoke or drink alcohol.   ER Course:  vitals: afebrile, bp: 221/92, HR; 63, RR: 21, oxygen: 92%RA Pertinent labs: wbc: 11.3, INR: 1.6, UA: positive nitrite CXR: cardiomegaly no edema/effusion. Prominent central pulm vessels suggest arterial HTN CT angio head/neck: no acute intracranial finding. No LVO. Intracranial atherosclerosis including severe distal right M1 and moderate right A2 stenoses. Mild cervical carotid artery atherosclerosis without significant stenosis. 9 mm mass in the anterior right orbit, benign in appearance  and potentially reflecting a cavernous venous malformation or varix. In ED: given hydralazine, metoprolol. Neurology consulted and recommended transfer [facility] for [facility]ia/CVA work up.   Review of Systems: As mentioned in the history of present illness. All other systems reviewed and are negative. Past Medical History:  Diagnosis Date   ACID REFLUX DISEASE    Adenomatous colon polyp    Allergy    Anemia    Anxiety    Arthritis    Asthma    Blood transfusion without reported diagnosis    Cataract    Cervical radiculopathy    COPD (chronic obstructive pulmonary disease) ([place])    Diabetes mellitus    Enlarged heart    born with   Fundic gland polyps of stomach, benign 03/06/2015   Hyperlipidemia    Hyperplastic polyp of stomach 03/06/2015   HYPERTENSION    HYPOTHYROIDISM    Irritable bowel syndrome    Long term current use of anticoagulant    Lumbar spinal stenosis    MITRAL VALVE PROLAPSE    Peripheral neuropathy    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 She has never used smokeless tobacco. She reports that she does not drink alcohol and does not use drugs.  Allergies  Allergen Reactions   Ampicillin    Aspirin    Caffeine    Codeine    Lipitor [Atorvastatin] Other (See Comments)    Headache, numbness   Penicillins    Ketoconazole Rash    Topical cream caused rash    Family History  Problem Relation Age of Onset   Heart disease Mother    Clotting disorder Mother    Pancreatic cancer Brother    Pancreatic disease Sister    Pancreatic cancer  Sister    Breast cancer Sister    Diabetes Sister        x 3   Heart disease Sister    Diabetes Brother        x 3   Cancer Brother        lower bowel   Liver cancer Sister     Prior to Admission medications   Medication Sig Start Date End Date Taking? Authorizing Provider  albuterol (VENTOLIN HFA) 108 (90 Base) MCG/ACT inhaler Inhale into the lungs. 12/08/19   [provider]  Alcohol Swabs (B-D SINGLE USE SWABS REGULAR) PADS  11/15/19   [provider]  amLODipine (NORVASC) 10 MG tablet  01/18/20   [provider]  atorvastatin (LIPITOR) 10 MG tablet Take 1 tablet (10 mg total) by mouth daily. 05/10/14   Biagio Borg, MD  Blood Glucose Monitoring Suppl (TRUE METRIX AIR GLUCOSE METER) w/Device KIT  07/04/19   [provider]  Elastic Bandages & Supports (ABDOMINAL BINDER/ELASTIC XL) MISC 1 Device by Does not apply route as needed (to prevent abdominal pain from hernia). 03/02/15   Gatha Mayer, MD  fenofibrate (TRICOR) 145 MG tablet Take 1 tablet (145 mg total) by mouth daily. 10/03/21   Richardo Priest, MD  fluticasone (FLOVENT HFA) 220 MCG/ACT inhaler Inhale into the lungs. 10/26/18   [provider]  furosemide (LASIX) 40 MG tablet Take 1 tablet (40 mg total) by mouth 2 (two) times daily. 01/10/14   Biagio Borg, MD  gabapentin (NEURONTIN) 100 MG capsule  11/20/19   [provider]  gabapentin (NEURONTIN) 300 MG capsule  01/03/20   [provider]  glucose blood (TRUE METRIX BLOOD GLUCOSE TEST) test strip TEST TWO TIMES DAILY 06/23/19   [provider]  levothyroxine (SYNTHROID, LEVOTHROID) 25 MCG tablet Take 1 tablet (25 mcg total) by mouth daily. 09/28/14   Biagio Borg, MD  metFORMIN (GLUCOPHAGE) 500 MG tablet  01/18/20   [provider]  metoprolol (LOPRESSOR) 50 MG tablet TAKE ONE TABLET BY MOUTH TWICE DAILY 11/29/13   Biagio Borg, MD  nitroGLYCERIN (NITROSTAT) 0.4 MG SL tablet Place 1 tablet (0.4 mg  total) under the tongue every 5 (five) minutes as needed. 03/15/20   Richardo Priest, MD  omeprazole (PRILOSEC) 20 MG capsule  12/26/19   [provider]  telmisartan (MICARDIS) 40 MG tablet Take 1 tablet (40 mg total) by mouth daily. 02/21/21   Richardo Priest, MD  TRUEplus Lancets 33G MISC  11/15/19   [provider]  warfarin (COUMADIN) 6 MG tablet Take as directed by anticoagulation clinic 07/12/14   Biagio Borg, MD    Physical Exam: Vitals:   09/05/22 1300 09/05/22 1425 09/05/22 1430  09/05/22 1540  BP: (!) 197/79  (!) 207/89 (!) 187/70  Pulse: 63  64 69  Resp: _0 Temp:  97.8 F (36.6 C)  98.1 F (36.7 C)  TempSrc:  Oral  Oral  SpO2: 97%  97% 95%  Weight:      Height:       General:  Appears calm and comfortable and is in NAD Eyes:  PERRL, EOMI, normal lids, iris. Right eye that is superiorly rotated and protuberant. ENT:  hard of hearing, lips & tongue, mmm; missing teeth  Neck:  no LAD, masses or thyromegaly; no carotid bruits Cardiovascular:  RRR, no m/r/g. No LE edema.  Respiratory:   CTA bilaterally with no wheezes/rales/rhonchi.  Normal respiratory effort. Abdomen:  soft, NT, ND, NABS Back:   normal alignment, no CVAT Skin:  no rash or induration seen on limited exam Musculoskeletal:  grossly normal tone BUE/BLE, good ROM, no bony abnormality Lower extremity:  No LE edema.  Limited foot exam with no ulcerations.  2+ distal pulses. Psychiatric:  grossly normal mood and affect, speech fluent and appropriate, AOx3 Neurologic:  CN 2-12 grossly intact, moves all extremities in coordinated fashion, sensation intact. HTK intact bilaterally. FTN intact bilaterally. Gait deferred.    Radiological Exams on Admission: Independently reviewed - see discussion in A/P where applicable  MR BRAIN WO CONTRAST  Result Date: 09/05/2022 CLINICAL DATA:  Acute neuro deficit.  Rule out stroke EXAM: MRI HEAD WITHOUT CONTRAST TECHNIQUE: Multiplanar, multiecho pulse  sequences of the brain and surrounding structures were obtained without intravenous contrast. COMPARISON:  CT head 09/04/2022 FINDINGS: Brain: No acute infarction, hemorrhage, hydrocephalus, extra-axial collection or mass lesion. Mild white matter changes with patchy periventricular deep white matter hyperintensities bilaterally. Brainstem intact. Vascular: Normal arterial flow voids Skull and upper cervical spine: No focal skeletal abnormality Sinuses/Orbits: 9 mm mass medial to the right orbit as noted on CT yesterday. Possible hemangioma. Right cataract extraction paranasal sinuses clear. Other: None IMPRESSION: Negative for acute infarct Mild chronic microvascular ischemic change 9 mm mass medial to the right globe. Electronically Signed   By: Franchot Gallo M.D.   On: 09/05/2022 18:57   CT ANGIO HEAD NECK W WO CM  Result Date: 09/04/2022 CLINICAL DATA:  Stroke follow-up. Right-sided facial droop with slurred speech. EXAM: CT ANGIOGRAPHY HEAD AND NECK TECHNIQUE: Multidetector CT imaging of the head and neck was performed using the standard protocol during bolus administration of intravenous contrast. Multiplanar CT image reconstructions and MIPs were obtained to evaluate the vascular anatomy. Carotid stenosis measurements (when applicable) are obtained utilizing NASCET criteria, using the distal internal carotid diameter as the denominator. RADIATION DOSE REDUCTION: This exam was performed according to the departmental dose-optimization program which includes automated exposure control, adjustment of the mA and/or kV according to patient size and/or use of iterative reconstruction technique. CONTRAST:  61m OMNIPAQUE IOHEXOL 350 MG/ML SOLN COMPARISON:  Head CT 12/11/2012 FINDINGS: CT HEAD FINDINGS Brain: There is no evidence of an acute infarct, intracranial hemorrhage, mass, midline shift, or extra-axial fluid collection. The ventricles and sulci are normal for age. Hypodensities in the cerebral white  matter bilaterally are unchanged and nonspecific but compatible with mild chronic small vessel ischemic disease. Vascular: Calcified atherosclerosis at the skull base. No hyperdense vessel. Skull: No fracture or suspicious osseous lesion. Sinuses/Orbits: Paranasal sinuses and mastoid air cells are clear. Right cataract extraction. 9 mm mass in the anterior aspect of the right orbit medial to the globe which was also  present in 2020 though is slightly larger today, partially enhances on the CTA images, and appears to be associated with a vein, overall benign in appearance and potentially reflecting a cavernous venous malformation or varix. Other: None. Review of the MIP images confirms the above findings CTA NECK FINDINGS Aortic arch: Standard 3 vessel aortic arch with mild atherosclerotic plaque. Patent brachiocephalic and subclavian arteries with mixed soft and calcified plaque in the proximal aspects of both subclavian arteries resulting in luminal irregularity but no flow limiting stenosis. Right carotid system: Patent with a small amount of mixed calcified and soft plaque in the carotid bulb. No evidence of a significant stenosis or dissection. Tortuous common and proximal internal carotid arteries. Left carotid system: Patent with a small amount of predominantly calcified plaque at the carotid bifurcation. No evidence of a significant stenosis or dissection. Tortuous mid cervical ICA. Vertebral arteries: Patent without evidence of a significant stenosis or dissection. Codominant. Skeleton: Advanced disc degeneration in the lower cervical spine. Asymmetrically advanced right-sided cervical facet arthrosis, greatest at C4-5. Moderate spinal stenosis at C6-7 due to prominent posterior vertebral osteophytes. Other neck: No evidence of cervical lymphadenopathy or mass. Upper chest: Clear lung apices. Review of the MIP images confirms the above findings CTA HEAD FINDINGS Anterior circulation: The internal carotid  arteries are patent from skull base to carotid termini with mild-to-moderate atherosclerotic plaque bilaterally not resulting in significant stenosis. ACAs and MCAs are patent without evidence of a proximal branch occlusion or a significant A1 or left M1 stenosis. There is a severe distal right M1 stenosis, and there is a moderate proximal right A2 stenosis. No aneurysm is identified. Posterior circulation: The intracranial vertebral arteries are patent to the basilar with mild atherosclerotic irregularity but no significant stenosis. Patent PICA, AICA, and SCA origins are seen bilaterally. The basilar artery is widely patent. There are small posterior communicating arteries bilaterally. Both PCAs are patent with atherosclerotic irregularity involving the branch vessels but no evidence of a flow limiting proximal stenosis. There is an early bifurcation of the left PCA. No aneurysm is identified. Venous sinuses: Patent. Anatomic variants: None. Review of the MIP images confirms the above findings IMPRESSION: 1. No evidence of acute intracranial abnormality. 2. No large vessel occlusion. 3. Intracranial atherosclerosis including severe distal right M1 and moderate right A2 stenoses. 4. Mild cervical carotid artery atherosclerosis without significant stenosis. 5. 9 mm mass in the anterior right orbit, benign in appearance and potentially reflecting a cavernous venous malformation or varix. 6. Aortic Atherosclerosis (ICD10-I70.0). Electronically Signed   By: Logan Bores M.D.   On: 09/04/2022 18:03   DG Chest Portable 1 View  Result Date: 09/04/2022 CLINICAL DATA:  Chest pain EXAM: PORTABLE CHEST 1 VIEW COMPARISON:  05/31/2013, 02/22/2021 FINDINGS: Cardiomegaly. Aortic atherosclerosis. Central pulmonary vessels appears slightly enlarged. No pleural effusion or pneumothorax. IMPRESSION: 1. Cardiomegaly without edema or pleural effusion 2. Prominent central pulmonary vessels suggest arterial hypertension  Electronically Signed   By: Donavan Foil M.D.   On: 09/04/2022 16:56    EKG: Independently reviewed.  NSR with rate 65; nonspecific ST changes with no evidence of acute ischemia. RBBB/LAFB. Similiar to previous    Labs on Admission: I have personally reviewed the available labs and imaging studies at the time of the admission.  Pertinent labs:   wbc: 11.3, INR: 1.6,  UA: positive nitrite  Assessment and Plan: Principal Problem:   TIA (transient ischemic attack) Active Problems:   CAD S/P percutaneous coronary angioplasty   Hypertensive urgency  UTI (urinary tract infection)   Type 2 diabetes mellitus without complication, without long-term current use of insulin (HCC)   Hyperlipidemia   Factor 5 Leiden mutation, heterozygous with hx of PE   Stage 2 moderate COPD by GOLD classification ([place])   Stage 3a chronic kidney disease (HCC)   Hypothyroidism   orbital mass     Assessment and Plan: * TIA (transient ischemic attack) 88 year old who presented to ED with history of acute onset right facial drooping and dysarthria that resolved after 30 minutes. Risks for stroke include age, family history, T2DM, HLD, factor V leiden and HTN  -HTN urgency, but symptoms resolved before blood pressure treated  -place in observation on telemetry for TIA/stroke work-up -Neurochecks per protocol -Neurology consulted -CT head and CTA head/neck with no acute finding and no LVO occlusion.  -MRI brain without contrast  -echo -lipid panel/A1C -Continue plavix, on coumadin  -Permissive hypertension first 24 hours <220/110 -N.p.o. until bedside swallow screen -PT/ OT/ SLP consult   Hypertensive urgency Presenting to ED with blood pressure of 221/92 and stroke like symptoms; however, symptoms resolved before blood pressure treated Blood pressure now 170/80 Allow for permissive HTN in setting of possible CVA with goal <220/110   CAD S/P percutaneous coronary angioplasty Followed by cardiology  at [facility]. History of NSTEMI/cath in 02/08/21 with DES to OM2, PTCA distal RCA. Continue plavix, isosorbide, norvasc 41m  and toprol-xl 15107mdaily after allowing for permissive HTN Re started metoprolol only  She is statin intolerant     UTI (urinary tract infection) Dysuria and frequency with UA with + nitrite Continue rocephin Urine cx pending   Type 2 diabetes mellitus without complication, without long-term current use of insulin (HCC) A1C of 6.4 in July 2023 Hold metformin SSI and accuchecks Qac/hs  Hyperlipidemia Continue pravachol, zetia and tricor  Repeat fasting lipid panel  LDL in 7/23: 104  Factor 5 Leiden mutation, heterozygous with hx of PE Continue coumadin per pharmacy, INR subtherapeutic at 1.6.   Stage 2 moderate COPD by GOLD classification (HCPierpointStable, no signs of exacerbation Continue flovent daily and SABA prn   Stage 3a chronic kidney disease (HCDillonAt baseline, continue to monitor   Hypothyroidism TSH wnl 06/2022 Continue synthroid 5055mdaily   orbital mass  On CT and MRI she has a 9 mm mass medial to the right orbit as noted on CT yesterday. Possible hemangioma.  Her eye does deviate superiorly and is protruded. She has pain at times.  Discussed would need to see optho outpatient     Advance Care Planning:   Code Status: DNR   Consults: neurology   DVT Prophylaxis: coumadin   Family Communication: son and daughter in law at bedside.   Severity of Illness: The appropriate patient status for this patient is OBSERVATION. Observation status is judged to be reasonable and necessary in order to provide the required intensity of service to ensure the patient's safety. The patient's presenting symptoms, physical exam findings, and initial radiographic and laboratory data in the context of their medical condition is felt to place them at decreased risk for further clinical deterioration. Furthermore, it is anticipated that the patient will be  medically stable for discharge from the hospital within 2 midnights of admission.   Author: AllOrma FlamingD 09/05/2022 8:11 PM  For on call review [email or web address]

## 2022-09-05 NOTE — Assessment & Plan Note (Addendum)
TSH wnl 06/2022 Continue synthroid 45mg daily

## 2022-09-05 NOTE — Assessment & Plan Note (Signed)
A1C of 6.4 in July 2023 Hold metformin SSI and accuchecks Qac/hs

## 2022-09-05 NOTE — Assessment & Plan Note (Signed)
Continue coumadin per pharmacy, INR subtherapeutic at 1.6.

## 2022-09-05 NOTE — ED Notes (Signed)
Family at bedside upset that transport is still not here. Explained to family possible reasons of delay. Family states "we couldve transported her ourselves... we shouldve came straight to [facility] instead of here.... I'm gonna message someone I know....".

## 2022-09-05 NOTE — ED Notes (Signed)
Pt started complains of 8/10 intermittent chest pain which started after Hydralazine IV was given. EKG done and informed Dr. Sherry Ruffing. MD acknowledged. SBP now 178/69 after Hydralazine.

## 2022-09-05 NOTE — Assessment & Plan Note (Signed)
On CT and MRI she has a 9 mm mass medial to the right orbit as noted on CT yesterday. Possible hemangioma.  Her eye does deviate superiorly and is protruded. She has pain at times.  Discussed would need to see optho outpatient

## 2022-09-05 NOTE — Assessment & Plan Note (Addendum)
Presenting to ED with blood pressure of 221/92 and stroke like symptoms; however, symptoms resolved before blood pressure treated Blood pressure now 170/80 Allow for permissive HTN in setting of possible CVA with goal <220/110

## 2022-09-05 NOTE — Assessment & Plan Note (Signed)
At baseline, continue to monitor °

## 2022-09-05 NOTE — Consult Note (Signed)
NEUROLOGY CONSULTATION NOTE   Date of service: September 05, 2022 Patient Name: Briana Hartman MRN:  [id number] DOB:  [birth year] Reason for consult: "TIA" Requesting Provider: Rise Patience, MD _ _ _   _ __   _ __ _ _  __ __   _ __   __ _  History of Present Illness  Briana Hartman is a 88 y.o. female with PMH significant for GERD, COPD, HTN, hypothyroidism, mitral valve prolapse, PE and factor V leiden on warfarin, CKD 3A, DM2 who presented to the ED with 30 mins episode of R facial droop and dysarthric speech. In addition, also felt paresthesias like a dog crawling on her chest. She was with her family in the car when these symptoms were noted. The symptoms had resolved by the time she got to the ED.  LKW: 3086 on 09/04/22. mRS: 3 tNKASE: not offered, resolution of symptoms. Thrombectomy: not offered, no LVO NIHSS components Score: Comment  1a Level of Conscious 0[x]  1[]  2[]  3[]      1b LOC Questions 0[x]  1[]  2[]       1c LOC Commands 0[x]  1[]  2[]       2 Best Gaze 0[x]  1[]  2[]       3 Visual 0[x]  1[]  2[]  3[]      4 Facial Palsy 0[x]  1[]  2[]  3[]      5a Motor Arm - left 0[x]  1[]  2[]  3[]  4[]  UN[]    5b Motor Arm - Right 0[x]  1[]  2[]  3[]  4[]  UN[]    6a Motor Leg - Left 0[x]  1[]  2[]  3[]  4[]  UN[]    6b Motor Leg - Right 0[x]  1[]  2[]  3[]  4[]  UN[]    7 Limb Ataxia 0[x]  1[]  2[]  3[]  UN[]     8 Sensory 0[x]  1[]  2[]  UN[]      9 Best Language 0[x]  1[]  2[]  3[]      10 Dysarthria 0[x]  1[]  2[]  UN[]      11 Extinct. and Inattention 0[x]  1[]  2[]       TOTAL: 0      ROS   Constitutional Denies weight loss, fever and chills.   HEENT Denies changes in vision and hearing.   Respiratory Denies SOB and cough.   CV Denies palpitations and CP   GI Denies abdominal pain, nausea, vomiting and diarrhea.   GU Denies dysuria and urinary frequency.   MSK Denies myalgia and joint pain.   Skin Denies rash and pruritus.   Neurological Denies headache and syncope.   Psychiatric Denies recent changes in  mood. Denies anxiety and depression.    Past History   Past Medical History:  Diagnosis Date   ACID REFLUX DISEASE    Adenomatous colon polyp    Allergy    Anemia    Anxiety    Arthritis    Asthma    Blood transfusion without reported diagnosis    Cataract    Cervical radiculopathy    COPD (chronic obstructive pulmonary disease) ([place])    Diabetes mellitus    Enlarged heart    born with   Fundic gland polyps of stomach, benign 03/06/2015   Hyperlipidemia    Hyperplastic polyp of stomach 03/06/2015   HYPERTENSION    HYPOTHYROIDISM    Irritable bowel syndrome    Long term current use of anticoagulant    Lumbar spinal stenosis    MITRAL VALVE PROLAPSE    Peripheral neuropathy    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, chronic anticaog   Skin cancer    VENTRAL HERNIA  Past Surgical History:  Procedure Laterality Date   ABDOMINAL HYSTERECTOMY     APPENDECTOMY     CATARACT EXTRACTION Right    CESAREAN SECTION     CHOLECYSTECTOMY     COLONOSCOPY  05/24/2004   interal hemorrhoids, diverticulosis   UPPER GASTROINTESTINAL ENDOSCOPY  12/31/2007   gastric polyp   VARICOSE VEIN SURGERY Right    Family History  Problem Relation Age of Onset   Heart disease Mother    Clotting disorder Mother    Pancreatic cancer Brother    Pancreatic disease Sister    Pancreatic cancer Sister    Breast cancer Sister    Diabetes Sister        x 3   Heart disease Sister    Diabetes Brother        x 3   Cancer Brother        lower bowel   Liver cancer Sister    Social History   Socioeconomic History   Marital status: Married    Spouse name: Not on file   Number of children: 3   Years of education: Not on file   Highest education level: Not on file  Occupational History   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 [employer]   Tobacco Use   Smoking status: Never   Smokeless tobacco: Never  Vaping Use   Vaping Use: Never used  Substance and Sexual Activity   Alcohol use: No    Alcohol/week: 0.0 standard  drinks of alcohol   Drug use: No   Sexual activity: Yes  Other Topics Concern   Not on file  Social History Narrative   Not on file   Social Determinants of Health   Financial Resource Strain: Not on file  Food Insecurity: No Food Insecurity (09/05/2022)   Hunger Vital Sign    Worried About Running Out of Food in the Last Year: Never true    Ran Out of Food in the Last Year: Never true  Transportation Needs: No Transportation Needs (09/05/2022)   PRAPARE - Hydrologist (Medical): No    Lack of Transportation (Non-Medical): No  Physical Activity: Not on file  Stress: Not on file  Social Connections: Not on file   Allergies  Allergen Reactions   Aspirin     unknown   Caffeine     unknown   Codeine Hives   Hydroxyzine     Other reaction(s): Other (See Comments) Nightmares and shaky hands   Lipitor [Atorvastatin] Other (See Comments)    Headache, numbness   Ampicillin Rash   Ketoconazole Rash    Topical cream caused rash   Penicillins Rash    Medications   Medications Prior to Admission  Medication Sig Dispense Refill Last Dose   albuterol (VENTOLIN HFA) 108 (90 Base) MCG/ACT inhaler Inhale 1 puff into the lungs every 6 (six) hours as needed for shortness of breath.   09/03/2022   amLODipine (NORVASC) 10 MG tablet    09/03/2022   BREO ELLIPTA 100-25 MCG/ACT AEPB Inhale 1 puff into the lungs daily.   09/03/2022   fenofibrate (TRICOR) 145 MG tablet Take 1 tablet (145 mg total) by mouth daily. 90 tablet 3 09/03/2022   fluticasone (FLOVENT HFA) 220 MCG/ACT inhaler Inhale 1 puff into the lungs daily as needed (For shortness of breath).   Past Week   furosemide (LASIX) 40 MG tablet Take 1 tablet (40 mg total) by mouth 2 (two) times daily. (Patient taking differently: Take 40 mg by  mouth daily.) 60 tablet 11 09/03/2022   gabapentin (NEURONTIN) 300 MG capsule Take 300 mg by mouth 3 (three) times daily.   09/03/2022   levothyroxine (SYNTHROID) 50 MCG tablet  Take 50 mcg by mouth daily before breakfast.   09/03/2022   metFORMIN (GLUCOPHAGE) 500 MG tablet Take 500 mg by mouth daily with breakfast.   09/03/2022   metoprolol (LOPRESSOR) 50 MG tablet TAKE ONE TABLET BY MOUTH TWICE DAILY (Patient taking differently: Take 75 mg by mouth 2 (two) times daily.) 180 tablet 3 09/03/2022   nitroGLYCERIN (NITROSTAT) 0.4 MG SL tablet Place 1 tablet (0.4 mg total) under the tongue every 5 (five) minutes as needed. (Patient taking differently: Place 0.4 mg under the tongue every 5 (five) minutes as needed for chest pain.) 75 tablet 3 unknown   telmisartan (MICARDIS) 40 MG tablet Take 1 tablet (40 mg total) by mouth daily. 30 tablet 6 09/03/2022   warfarin (COUMADIN) 6 MG tablet Take as directed by anticoagulation clinic (Patient taking differently: Take 6 mg by mouth daily.) 35 tablet 3 09/03/2022 at 1800   Alcohol Swabs (B-D SINGLE USE SWABS REGULAR) PADS       Blood Glucose Monitoring Suppl (TRUE METRIX AIR GLUCOSE METER) w/Device KIT       Elastic Bandages & Supports (ABDOMINAL BINDER/ELASTIC XL) MISC 1 Device by Does not apply route as needed (to prevent abdominal pain from hernia). 1 each 0    glucose blood (TRUE METRIX BLOOD GLUCOSE TEST) test strip TEST TWO TIMES DAILY      levothyroxine (SYNTHROID, LEVOTHROID) 25 MCG tablet Take 1 tablet (25 mcg total) by mouth daily. (Patient not taking: Reported on 09/05/2022) 30 tablet 11 Not Taking   omeprazole (PRILOSEC) 20 MG capsule Take 20 mg by mouth daily.   09/03/2022   TRUEplus Lancets 33G MISC         Vitals   Vitals:   09/05/22 1425 09/05/22 1430 09/05/22 1540 09/05/22 2049  BP:  (!) 207/89 (!) 187/70 (!) 181/62  Pulse:  64 69 60  Resp:  20 15 20   Temp: 97.8 F (36.6 C)  98.1 F (36.7 C) 98 F (36.7 C)  TempSrc: Oral  Oral Temporal  SpO2:  97% 95% 91%  Weight:      Height:         Body mass index is 29.41 kg/m.  Physical Exam   General: Laying comfortably in bed; in no acute distress.  HENT: Normal  oropharynx and mucosa. Normal external appearance of ears and nose.  Neck: Supple, no pain or tenderness  CV: No JVD. No peripheral edema.  Pulmonary: Symmetric Chest rise. Normal respiratory effort.  Abdomen: Soft to touch, non-tender.  Ext: No cyanosis, edema, or deformity  Skin: No rash. Normal palpation of skin.   Musculoskeletal: Normal digits and nails by inspection. No clubbing.   Neurologic Examination  Mental status/Cognition: Alert, oriented to self, place, month and year, good attention.  Speech/language: Fluent, comprehension intact, object naming intact, repetition intact.  Cranial nerves:   CN II Pupils equal and reactive to light, no VF deficits    CN III,IV,VI EOM intact, no gaze preference or deviation, no nystagmus    CN V normal sensation in V1, V2, and V3 segments bilaterally    CN VII no asymmetry, no nasolabial fold flattening    CN VIII normal hearing to speech    CN IX & X normal palatal elevation, no uvular deviation    CN XI 5/5 head turn and  5/5 shoulder shrug bilaterally    CN XII midline tongue protrusion    Motor:  Muscle bulk: normal, tone normal, pronator drift none tremor none Mvmt Root Nerve  Muscle Right Left Comments  SA C5/6 Ax Deltoid 5 5   EF C5/6 Mc Biceps 5 5   EE C6/7/8 Rad Triceps 5 5   WF C6/7 Med FCR     WE C7/8 PIN ECU     F Ab C8/T1 U ADM/FDI 5 5   HF L1/2/3 Fem Illopsoas 5 5   KE L2/3/4 Fem Quad 5 5   DF L4/5 D Peron Tib Ant 5 5   PF S1/2 Tibial Grc/Sol 5 5    Sensation:  Light touch Intact throughout   Pin prick    Temperature    Vibration   Proprioception    Coordination/Complex Motor:  - Finger to Nose intact BL - Heel to shin unable to do - Rapid alternating movement are slowed throughout - Gait: Deferred for patient safety.  Labs   CBC:  Recent Labs  Lab 09/04/22 1600  WBC 11.3*  NEUTROABS 6.8  HGB 12.3  HCT 39.7  MCV 88.8  PLT 937    Basic Metabolic Panel:  Lab Results  Component Value Date   NA  138 09/04/2022   K 4.4 09/04/2022   CO2 25 09/04/2022   GLUCOSE 95 09/04/2022   BUN 15 09/04/2022   CREATININE 0.78 09/04/2022   CALCIUM 9.4 09/04/2022   GFRNONAA >60 09/04/2022   GFRAA 64 01/26/2020   Lipid Panel:  Lab Results  Component Value Date   LDLCALC 93 01/26/2020   HgbA1c:  Lab Results  Component Value Date   HGBA1C 6.2 05/31/2013   Urine Drug Screen:     Component Value Date/Time   LABOPIA NONE DETECTED 09/04/2022 1610   COCAINSCRNUR NONE DETECTED 09/04/2022 1610   LABBENZ NONE DETECTED 09/04/2022 1610   AMPHETMU NONE DETECTED 09/04/2022 1610   THCU NONE DETECTED 09/04/2022 1610   LABBARB NONE DETECTED 09/04/2022 1610    Alcohol Level     Component Value Date/Time   ETH <10 09/04/2022 1600    CT Head without contrast(Personally reviewed): CTH was negative for a large hypodensity concerning for a large territory infarct or hyperdensity concerning for an ICH  CT angio Head and Neck with contrast(Personally reviewed): Multifocal multivessel atherosclerotic disease, most prominent is severe distal right M1 and moderate right A2 stenosis.  MRI Brain(Personally reviewed): No acute intracranial abnormality  Impression   Briana Hartman is a 88 y.o. female with PMH significant for ith PMH significant for GERD, COPD, HTN, hypothyroidism, mitral valve prolapse, PE and factor V leiden on warfarin, CKD 3A, DM2 who presented to the ED with 30 mins episode of R facial droop and dysarthric speech. Her neurologic examination is notable for no focal deficit.  Episode is highly concerning for a TIA. She does have multifocal multivessel intracranial atherosclerotic disease with severe distal right M1 and moderate right A2 stenosis.  However, they would not explain her right-sided symptoms.  She is also on warfarin and thus not an ideal candidate to add an antiplatelet at this time given her advanced age and high risk for bleeding.  Impression: TIA.  Recommendations    - Frequent Neuro checks per stroke unit protocol - Recommend obtaining TTE - Recommend obtaining Lipid panel with LDL - Please start statin if LDL > 70 - Recommend HbA1c -Continue warfarin. - Recommend DVT ppx - SBP goal - permissive hypertension  first 24 h < 220/110. Held home meds.  - Recommend Telemetry monitoring for arrythmia - Recommend bedside swallow screen prior to PO intake. - Stroke education booklet - Recommend PT/OT/SLP consult  ______________________________________________________________________   Thank you for the opportunity to take part in the care of this patient. If you have any further questions, please contact the neurology consultation attending.  Signed,  [place] Pager Number [phone] _ _ _   _ __   _ __ _ _  __ __   _ __   __ _

## 2022-09-05 NOTE — ED Notes (Signed)
Report given to [facility]. 

## 2022-09-05 NOTE — Assessment & Plan Note (Addendum)
Continue pravachol, zetia and tricor  Repeat fasting lipid panel  LDL in 7/23: 104

## 2022-09-05 NOTE — Assessment & Plan Note (Signed)
Dysuria and frequency with UA with + nitrite Continue rocephin Urine cx pending

## 2022-09-05 NOTE — Assessment & Plan Note (Addendum)
Followed by cardiology at [facility]. History of NSTEMI/cath in 02/08/21 with DES to OM2, PTCA distal RCA. Continue plavix, isosorbide, norvasc '10mg'$   and toprol-xl '150mg'$  daily after allowing for permissive HTN Re started metoprolol only  She is statin intolerant

## 2022-09-05 NOTE — Progress Notes (Signed)
ANTICOAGULATION CONSULT NOTE - Follow Up Consult  Pharmacy Consult for Warfarin Indication: pulmonary embolus history  Allergies  Allergen Reactions   Ampicillin    Aspirin    Caffeine    Codeine    Lipitor [Atorvastatin] Other (See Comments)    Headache, numbness   Penicillins    Ketoconazole Rash    Topical cream caused rash    Patient Measurements: Height: '5\' 6"'$  (167.6 cm) Weight: 82.6 kg (182 lb 3.2 oz) IBW/kg (Calculated) : 59.3   Labs: Recent Labs    09/04/22 1600 09/04/22 1822  HGB 12.3  --   HCT 39.7  --   PLT 280  --   APTT 31  --   LABPROT 19.2*  --   INR 1.6*  --   CREATININE 0.78  --   TROPONINIHS 13 13    Estimated Creatinine Clearance: 54.7 mL/min (by C-G formula based on SCr of 0.78 mg/dL).   Assessment: 88 year old female on warfarin prior to admission for history of PE and factor V leiden mutation admitted with right sided facial droop and dysarthria   INR sub-therapeutic on admission at 1.6  Outpatient warfarin visit 9/11, INR was 4 a that time, told to hold warfarin x 2 days and increase vitamin K by 1 serving. Weekly warfarin dose PTA 42 mg   Goal of Therapy:  INR 2-3 Monitor platelets by anticoagulation protocol: Yes   Plan:  Warfarin 7.5 mg po x 1 dose tonight Daily INR  Thank you Anette Guarneri, PharmD   09/05/2022,4:35 PM

## 2022-09-05 NOTE — Assessment & Plan Note (Addendum)
88 year old who presented to ED with history of acute onset right facial drooping and dysarthria that resolved after 30 minutes. Risks for stroke include age, family history, T2DM, HLD, factor V leiden and HTN  -HTN urgency, but symptoms resolved before blood pressure treated  -place in observation on telemetry for TIA/stroke work-up -Neurochecks per protocol -Neurology consulted -CT head and CTA head/neck with no acute finding and no LVO occlusion.  -MRI brain without contrast  -echo -lipid panel/A1C -Continue plavix, on coumadin  -Permissive hypertension first 24 hours <220/110 -N.p.o. until bedside swallow screen -PT/ OT/ SLP consult

## 2022-09-06 ENCOUNTER — Observation Stay (HOSPITAL_BASED_OUTPATIENT_CLINIC_OR_DEPARTMENT_OTHER): Payer: Medicare HMO

## 2022-09-06 DIAGNOSIS — I160 Hypertensive urgency: Secondary | ICD-10-CM

## 2022-09-06 DIAGNOSIS — E782 Mixed hyperlipidemia: Secondary | ICD-10-CM | POA: Diagnosis not present

## 2022-09-06 DIAGNOSIS — H5789 Other specified disorders of eye and adnexa: Secondary | ICD-10-CM

## 2022-09-06 DIAGNOSIS — G459 Transient cerebral ischemic attack, unspecified: Secondary | ICD-10-CM

## 2022-09-06 DIAGNOSIS — D6851 Activated protein C resistance: Secondary | ICD-10-CM | POA: Diagnosis not present

## 2022-09-06 DIAGNOSIS — E119 Type 2 diabetes mellitus without complications: Secondary | ICD-10-CM

## 2022-09-06 LAB — ECHOCARDIOGRAM COMPLETE
AR max vel: 3.18 cm2
AV Area VTI: 2.89 cm2
AV Area mean vel: 3.06 cm2
AV Mean grad: 3 mmHg
AV Peak grad: 6.5 mmHg
Ao pk vel: 1.27 m/s
Area-P 1/2: 2.55 cm2
Height: 66 in
S' Lateral: 3.2 cm
Weight: 2915.2 oz

## 2022-09-06 LAB — PROTIME-INR
INR: 1.8 — ABNORMAL HIGH (ref 0.8–1.2)
Prothrombin Time: 20.9 seconds — ABNORMAL HIGH (ref 11.4–15.2)

## 2022-09-06 LAB — URINE CULTURE: Culture: <10000 — AB

## 2022-09-06 LAB — LIPID PANEL
Cholesterol: 113 mg/dL (ref 0–200)
HDL: 30 mg/dL — ABNORMAL LOW (ref >40)
LDL Cholesterol: 59 mg/dL (ref 0–99)
Total CHOL/HDL Ratio: 3.8 RATIO
Triglycerides: 120 mg/dL (ref <150)
VLDL: 24 mg/dL (ref 0–40)

## 2022-09-06 LAB — GLUCOSE, CAPILLARY
Glucose-Capillary: 126 mg/dL — ABNORMAL HIGH (ref 70–99)
Glucose-Capillary: 135 mg/dL — ABNORMAL HIGH (ref 70–99)
Glucose-Capillary: 117 mg/dL — ABNORMAL HIGH (ref 70–99)
Glucose-Capillary: 125 mg/dL — ABNORMAL HIGH (ref 70–99)

## 2022-09-06 LAB — HEMOGLOBIN A1C
Hgb A1c MFr Bld: 6.5 % — ABNORMAL HIGH (ref 4.8–5.6)
Mean Plasma Glucose: 139.85 mg/dL

## 2022-09-06 MED ORDER — HEPARIN SODIUM (PORCINE) 5000 UNIT/ML IJ SOLN: 5000.0000 [IU] | Freq: Three times a day (TID) | INTRAMUSCULAR | Status: DC

## 2022-09-06 MED ORDER — WARFARIN SODIUM 6 MG PO TABS
6.0000 mg | ORAL_TABLET | Freq: Once | ORAL | Status: DC
  Filled 2022-09-06: qty 1

## 2022-09-06 MED ORDER — APIXABAN 5 MG PO TABS
5.0000 mg | ORAL_TABLET | Freq: Two times a day (BID) | ORAL | Status: DC
  Administered 2022-09-06 – 2022-09-07 (×2): 5 mg via ORAL
  Filled 2022-09-06 (×2): qty 1

## 2022-09-06 NOTE — TOC Initial Note (Signed)
Transition of Care [facility]) - Initial/Assessment Note    Patient Details  Name: Briana Hartman MRN: [id number] Date of Birth: [birth year]  Transition of Care [facility]) CM/SW Contact:    Carles Collet, RN Phone Number: 09/06/2022, 4:19 PM  Clinical Narrative:                  Damaris Schooner w patient at bedside.  Agreeable to [facility] services. [facility] did not respond, [facility] able to accept referral. Patient declined 3/1 states she has one at home, but would like shower seat. Order placed and ordered through [place].  Expected Discharge Plan: [place] Barriers to Discharge: Continued Medical Work up   Patient Goals and CMS Choice Patient states their goals for this hospitalization and ongoing recovery are:: return home CMS Medicare.gov Compare Post Acute Care list provided to:: Patient Choice offered to / list presented to : Patient  Expected Discharge Plan and Services Expected Discharge Plan: [place]   Discharge Planning Services: CM Consult Post Acute Care Choice: Home Health                   DME Arranged: Shower stool DME Agency: [employer] Date DME Agency Contacted: 09/06/22 Time DME Agency Contacted: [id number] Representative spoke with at DME Agency: Mardene Celeste HH Arranged: PT, OT, Speech Therapy [place] Agency: [place] Date [place]: 09/06/22 Time [place]: 58 Representative spoke with at [employer]: [place] Arrangements/Services       Do you feel safe going back to the place where you live?: Yes               Activities of Daily Living Home Assistive Devices/Equipment: Environmental consultant (specify type) ADL Screening (condition at time of admission) Patient's cognitive ability adequate to safely complete daily activities?: Yes Is the patient deaf or have difficulty hearing?: Yes Does the patient have difficulty seeing, even when wearing glasses/contacts?: No Does the patient have difficulty concentrating,  remembering, or making decisions?: No Patient able to express need for assistance with ADLs?: Yes Does the patient have difficulty dressing or bathing?: Yes Independently performs ADLs?: Yes (appropriate for developmental age) Does the patient have difficulty walking or climbing stairs?: Yes Weakness of Legs: Both Weakness of Arms/Hands: None  Permission Sought/Granted                  Emotional Assessment              Admission diagnosis:  TIA (transient ischemic attack) [G45.9] Patient Active Problem List   Diagnosis Date Noted   CAD S/P percutaneous coronary angioplasty 09/05/2022   UTI (urinary tract infection) 09/05/2022   Hypertensive urgency 09/05/2022   orbital mass  09/05/2022   TIA (transient ischemic attack) 09/04/2022   Stage 3a chronic kidney disease ([place]) 01/08/2022   Factor 5 Leiden mutation, heterozygous with hx of PE 03/13/2021   Cataract    Anemia    Adenomatous colon polyp    Violation of controlled substance agreement 06/08/2019   Long-term current use of benzodiazepine 05/18/2019   GAD (generalized anxiety disorder) 04/24/2019   Statin intolerance 04/24/2019   Allergic contact dermatitis due to drugs in contact with skin 06/16/2018   Achilles tendonitis, bilateral 06/07/2018   Graves' disease 04/22/2018   Hypertropia of right eye 04/22/2018   Keratoconjunctivitis sicca of both eyes not specified as Sjogren's 04/22/2018   Long term current use of oral hypoglycemic drug 04/22/2018   Type 2  diabetes mellitus without complication, without long-term current use of insulin ([place]) 04/22/2018   Vitreous floater, bilateral 04/22/2018   Amputated toe, left ([place]) 03/04/2017   Diabetic peripheral neuropathy ([place]) 10/23/2016   Abnormal INR 04/29/2016   Arthritis 12/26/2015   Stage 2 moderate COPD by GOLD classification ([place]) 12/26/2015   Anxiety 12/26/2015   Asthma 12/26/2015   Fundic gland polyps of stomach, benign 03/06/2015   Hyperplastic polyp of  stomach 03/06/2015   Fundic gland polyposis of stomach 03/06/2015   Insomnia 11/30/2012   History of pulmonary embolism 08/30/2012   Hearing loss, right 09/16/2011   Long term current use of anticoagulant 09/16/2011   Cervical radiculopathy 09/16/2011   History of anticoagulant therapy 09/16/2011   Allergy    Skin cancer    Hyperlipidemia    Hypertensive heart disease    Hypothyroidism    Lumbar spinal stenosis    Peripheral neuropathy    COLONIC POLYPS, HX OF 06/22/2009   Irritable bowel syndrome 05/11/2009   MITRAL VALVE PROLAPSE 04/25/2009   Venous thromboembolism (VTE) 04/25/2009   ACID REFLUX DISEASE 04/25/2009   VENTRAL HERNIA 04/25/2009   Mitral valve disease 04/25/2009   PCP:  [name], MD Pharmacy:   [facility] [address] [place] - [address] [place] [place] [address] Phone: [phone] Fax: [phone]  [id number] - [place], [place] - [address] [place]  [address] Phone: [phone] Fax: [phone]  [facility] [place], [place] [address] Phone: [phone] Fax: [phone]     Social Determinants of Health (SDOH) Interventions Food Insecurity Interventions: Intervention Not Indicated Housing Interventions: Intervention Not Indicated Transportation Interventions: Intervention Not Indicated Utilities Interventions: Intervention Not Indicated  Readmission Risk Interventions     No data to display

## 2022-09-06 NOTE — Discharge Instructions (Signed)
Information on my medicine - ELIQUIS (apixaban)  You were taking this medication prior to this hospital admission.    Why was Eliquis prescribed for you? Eliquis was prescribed to treat blood clots that may have been found in the veins of your legs (deep vein thrombosis) or in your lungs (pulmonary embolism) and to reduce the risk of them occurring again.  For your history of Pulmonary Embolism (PE) and history of heterozyguous Factor V Leiden mutation and antiphospholipid antibody.    What do You need to know about Eliquis ? The dose is ONE 5 mg tablet taken TWICE daily.  Eliquis may be taken with or without food.   Try to take the dose about the same time in the morning and in the evening. If you have difficulty swallowing the tablet whole please discuss with your pharmacist how to take the medication safely.  Take Eliquis exactly as prescribed and DO NOT stop taking Eliquis without talking to the doctor who prescribed the medication.  Stopping may increase your risk of developing a new blood clot.  Refill your prescription before you run out.  After discharge, you should have regular check-up appointments with your healthcare provider that is prescribing your Eliquis.    What do you do if you miss a dose? If a dose of ELIQUIS is not taken at the scheduled time, take it as soon as possible on the same day and twice-daily administration should be resumed. The dose should not be doubled to make up for a missed dose.  Important Safety Information A possible side effect of Eliquis is bleeding. You should call your healthcare provider right away if you experience any of the following: Bleeding from an injury or your nose that does not stop. Unusual colored urine (red or dark brown) or unusual colored stools (red or black). Unusual bruising for unknown reasons. A serious fall or if you hit your head (even if there is no bleeding).  Some medicines may interact with Eliquis and might  increase your risk of bleeding or clotting while on Eliquis. To help avoid this, consult your healthcare provider or pharmacist prior to using any new prescription or non-prescription medications, including herbals, vitamins, non-steroidal anti-inflammatory drugs (NSAIDs) and supplements.  This website has more information on Eliquis (apixaban): http://[email or web address].com/eliquis/home

## 2022-09-06 NOTE — Plan of Care (Signed)
  Problem: Education: Goal: Knowledge of General Education information will improve Description: Including pain rating scale, medication(s)/side effects and non-pharmacologic comfort measures Outcome: Progressing   Problem: Health Behavior/Discharge Planning: Goal: Ability to manage health-related needs will improve Outcome: Progressing   Problem: Clinical Measurements: Goal: Ability to maintain clinical measurements within normal limits will improve Outcome: Progressing Goal: Will remain free from infection Outcome: Progressing Goal: Diagnostic test results will improve Outcome: Progressing Goal: Respiratory complications will improve Outcome: Progressing Goal: Cardiovascular complication will be avoided Outcome: Progressing   Problem: Activity: Goal: Risk for activity intolerance will decrease Outcome: Progressing   Problem: Nutrition: Goal: Adequate nutrition will be maintained Outcome: Progressing   Problem: Coping: Goal: Level of anxiety will decrease Outcome: Progressing   Problem: Elimination: Goal: Will not experience complications related to bowel motility Outcome: Progressing Goal: Will not experience complications related to urinary retention Outcome: Progressing   Problem: Pain Managment: Goal: General experience of comfort will improve Outcome: Progressing   Problem: Safety: Goal: Ability to remain free from injury will improve Outcome: Progressing   Problem: Skin Integrity: Goal: Risk for impaired skin integrity will decrease Outcome: Progressing   Problem: Education: Goal: Ability to describe self-care measures that may prevent or decrease complications (Diabetes Survival Skills Education) will improve Outcome: Progressing Goal: Individualized Educational Video(s) Outcome: Progressing   Problem: Coping: Goal: Ability to adjust to condition or change in health will improve Outcome: Progressing   Problem: Fluid Volume: Goal: Ability to  maintain a balanced intake and output will improve Outcome: Progressing   Problem: Health Behavior/Discharge Planning: Goal: Ability to identify and utilize available resources and services will improve Outcome: Progressing Goal: Ability to manage health-related needs will improve Outcome: Progressing   Problem: Metabolic: Goal: Ability to maintain appropriate glucose levels will improve Outcome: Progressing   Problem: Nutritional: Goal: Maintenance of adequate nutrition will improve Outcome: Progressing Goal: Progress toward achieving an optimal weight will improve Outcome: Progressing   Problem: Skin Integrity: Goal: Risk for impaired skin integrity will decrease Outcome: Progressing   Problem: Tissue Perfusion: Goal: Adequacy of tissue perfusion will improve Outcome: Progressing   Problem: Education: Goal: Knowledge of disease or condition will improve Outcome: Progressing Goal: Knowledge of secondary prevention will improve (SELECT ALL) Outcome: Progressing Goal: Knowledge of patient specific risk factors will improve (INDIVIDUALIZE FOR PATIENT) Outcome: Progressing Goal: Individualized Educational Video(s) Outcome: Progressing   Problem: Health Behavior/Discharge Planning: Goal: Ability to manage health-related needs will improve Outcome: Progressing

## 2022-09-06 NOTE — Evaluation (Addendum)
Speech Language Pathology Evaluation Patient Details Name: Briana Hartman MRN: [id number] DOB: 1934-07-28 Today's Date: 09/06/2022 Time: 7253-6644 SLP Time Calculation (min) (ACUTE ONLY): 27 min  Problem List:  Patient Active Problem List   Diagnosis Date Noted   CAD S/P percutaneous coronary angioplasty 09/05/2022   UTI (urinary tract infection) 09/05/2022   Hypertensive urgency 09/05/2022   orbital mass  09/05/2022   TIA (transient ischemic attack) 09/04/2022   Stage 3a chronic kidney disease ([place]) 01/08/2022   Factor 5 Leiden mutation, heterozygous with hx of PE 03/13/2021   Cataract    Anemia    Adenomatous colon polyp    Violation of controlled substance agreement 06/08/2019   Long-term current use of benzodiazepine 05/18/2019   GAD (generalized anxiety disorder) 04/24/2019   Statin intolerance 04/24/2019   Allergic contact dermatitis due to drugs in contact with skin 06/16/2018   Achilles tendonitis, bilateral 06/07/2018   Graves' disease 04/22/2018   Hypertropia of right eye 04/22/2018   Keratoconjunctivitis sicca of both eyes not specified as Sjogren's 04/22/2018   Long term current use of oral hypoglycemic drug 04/22/2018   Type 2 diabetes mellitus without complication, without long-term current use of insulin ([place]) 04/22/2018   Vitreous floater, bilateral 04/22/2018   Amputated toe, left ([place]) 03/04/2017   Diabetic peripheral neuropathy ([place]) 10/23/2016   Abnormal INR 04/29/2016   Arthritis 12/26/2015   Stage 2 moderate COPD by GOLD classification ([place]) 12/26/2015   Anxiety 12/26/2015   Asthma 12/26/2015   Fundic gland polyps of stomach, benign 03/06/2015   Hyperplastic polyp of stomach 03/06/2015   Fundic gland polyposis of stomach 03/06/2015   Insomnia 11/30/2012   History of pulmonary embolism 08/30/2012   Hearing loss, right 09/16/2011   Long term current use of anticoagulant 09/16/2011   Cervical radiculopathy 09/16/2011   History of anticoagulant  therapy 09/16/2011   Allergy    Skin cancer    Hyperlipidemia    Hypertensive heart disease    Hypothyroidism    Lumbar spinal stenosis    Peripheral neuropathy    COLONIC POLYPS, HX OF 06/22/2009   Irritable bowel syndrome 05/11/2009   MITRAL VALVE PROLAPSE 04/25/2009   Venous thromboembolism (VTE) 04/25/2009   ACID REFLUX DISEASE 04/25/2009   VENTRAL HERNIA 04/25/2009   Mitral valve disease 04/25/2009   Past Medical History:  Past Medical History:  Diagnosis Date   ACID REFLUX DISEASE    Adenomatous colon polyp    Allergy    Anemia    Anxiety    Arthritis    Asthma    Blood transfusion without reported diagnosis    Cataract    Cervical radiculopathy    COPD (chronic obstructive pulmonary disease) ([place])    Diabetes mellitus    Enlarged heart    born with   Fundic gland polyps of stomach, benign 03/06/2015   Hyperlipidemia    Hyperplastic polyp of stomach 03/06/2015   HYPERTENSION    HYPOTHYROIDISM    Irritable bowel syndrome    Long term current use of anticoagulant    Lumbar spinal stenosis    MITRAL VALVE PROLAPSE    Peripheral neuropathy    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 MRI negative for acute infarct. PMH includes: COPD, factor V leiden mutation with h/o PE, T2DM, CAD, HTN, HLD, CKD, hypothyroidism, GAD   Assessment / Plan / Recommendation Clinical Impression  Pt presents wtih cognitive impairments but reports being cognitively independent PTA. Some  information she provides about PLOF is a little conflicting, but she does demonstrate difficulty with: sustained attention, storage/recall of new information, problem solving, and awareness. Speech sounds clear to this SLP as an unfamiliar listener, but pt is not sure if she has noticed any changes to her speech. She scored 13/30 on the SLUMS which is likely suggestive of an acute change if she is as independent at baseline as she reports. Recommend ongoing SLP f/u acutely and post-discharge.    SLP Assessment  SLP Recommendation/Assessment: Patient needs continued Speech [facility] SLP Visit Diagnosis: Cognitive communication deficit (R41.841)    Recommendations for follow up therapy are one component of a multi-disciplinary discharge planning process, led by the attending physician.  Recommendations may be updated based on patient status, additional functional criteria and insurance authorization.    Follow Up Recommendations  Home health SLP    Assistance Recommended at Discharge  Frequent or constant Supervision/Assistance  Functional Status Assessment Patient has had a recent decline in their functional status and demonstrates the ability to make significant improvements in function in a reasonable and predictable amount of time.  Frequency and Duration min 2x/week  2 weeks      SLP Evaluation Cognition  Overall Cognitive Status: No family/caregiver present to determine baseline cognitive functioning Arousal/Alertness: Awake/alert Orientation Level: Oriented X4 Attention: Sustained Sustained Attention: Impaired Sustained Attention Impairment: Verbal basic Memory: Impaired Memory Impairment: Storage deficit;Retrieval deficit;Decreased recall of new information Awareness: Impaired Awareness Impairment: Intellectual impairment;Anticipatory impairment Problem Solving: Impaired Problem Solving Impairment: Verbal complex Safety/Judgment: Impaired        Comprehension  Auditory Comprehension Overall Auditory Comprehension: Impaired Commands: Impaired One Step Basic Commands: 75-100% accurate Conversation: Simple Interfering Components: Hearing EffectiveTechniques: Increased volume;Stressing words    Expression Expression Primary Mode of Expression: Verbal Verbal Expression Overall Verbal Expression: Appears within functional limits for tasks assessed Written Expression Dominant Hand: Right   Oral / Motor  Motor Speech Overall Motor Speech: Appears within functional limits for tasks assessed            Osie Bond., M.A. [place] Office [phone]  Secure chat preferred  09/06/2022, 1:43 PM

## 2022-09-06 NOTE — Evaluation (Signed)
Occupational Therapy Evaluation Patient Details Name: Briana Hartman MRN: [id number] DOB: 02-10-1934 Today's Date: 09/06/2022   History of Present Illness Pt is an 88 y/o F presenting to ED on 9/14 with R facial droop, dysarthria, and R sided pain, CT and MRI negative for acute abnormality. PMH inlcudes factor V leiden mutation with hx of PE, DM2, CAD with PCI (2022), HTN, HLD, CKD III, hypothyroidism, COPD, and GAD.   Clinical Impression   Pt reports independence at baseline with ADLs/IADLs, does not drive, currently lives with son as of ~3 weeks ago when her spouse passed away. Pt using rollator for mobility. Pt currently needing min guard-mod A for ADLs, min guard for bed mobility and transfers with RW. Pt with decreased cognition and safety awareness, disoriented to situation and needing cues to not abandon RW during mobility. Pt presenting with impairments listed below, will follow acutely. Recommend HHOT at d/c.     Recommendations for follow up therapy are one component of a multi-disciplinary discharge planning process, led by the attending physician.  Recommendations may be updated based on patient status, additional functional criteria and insurance authorization.   Follow Up Recommendations  Home health OT    Assistance Recommended at Discharge Intermittent Supervision/Assistance  Patient can return home with the following A little help with walking and/or transfers;A little help with bathing/dressing/bathroom;Assistance with cooking/housework;Direct supervision/assist for financial management;Direct supervision/assist for medications management;Help with stairs or ramp for entrance;Assist for transportation    Functional Status Assessment  Patient has had a recent decline in their functional status and demonstrates the ability to make significant improvements in function in a reasonable and predictable amount of time.  Equipment Recommendations  BSC/3in1    Recommendations  for Other Services PT consult     Precautions / Restrictions Precautions Precautions: Fall Restrictions Weight Bearing Restrictions: No      Mobility Bed Mobility Overal bed mobility: Needs Assistance Bed Mobility: Supine to Sit     Supine to sit: Min guard     General bed mobility comments: increased time    Transfers Overall transfer level: Needs assistance Equipment used: Rolling walker (2 wheels) Transfers: Sit to/from Stand Sit to Stand: Min guard, Min assist           General transfer comment: min A from lower surfaces (toilet); cues for safety, attempts to abandon RW x3 during session      Balance Overall balance assessment: Needs assistance Sitting-balance support: Feet supported Sitting balance-Leahy Scale: Good Sitting balance - Comments: sits unsupported at EOB, able to reach down to adjust socks   Standing balance support: Bilateral upper extremity supported, Reliant on assistive device for balance, During functional activity Standing balance-Leahy Scale: Poor Standing balance comment: reliant on external support                           ADL either performed or assessed with clinical judgement   ADL Overall ADL's : Needs assistance/impaired Eating/Feeding: Supervision/ safety   Grooming: Min guard   Upper Body Bathing: Min guard   Lower Body Bathing: Moderate assistance   Upper Body Dressing : Min guard   Lower Body Dressing: Moderate assistance   Toilet Transfer: Min guard;Rolling walker (2 wheels);Ambulation;Regular Toilet;Minimal assistance   Toileting- Water quality scientist and Hygiene: Min guard;Sit to/from stand       Functional mobility during ADLs: Min guard;Rolling walker (2 wheels)       Vision Baseline Vision/History: 1 Wears glasses Patient Visual Report:  No change from baseline Additional Comments: pt reports diplopia at baseline, reports thyroid issues that have affected her eyes for ~30 years. Pt with  disconjugate gaze, R eye gaze upward. Bumping into things on R and L side of room during session     Perception     Praxis      Pertinent Vitals/Pain Pain Assessment Pain Assessment: No/denies pain     Hand Dominance     Extremity/Trunk Assessment Upper Extremity Assessment Upper Extremity Assessment: Generalized weakness   Lower Extremity Assessment Lower Extremity Assessment: Defer to PT evaluation   Cervical / Trunk Assessment Cervical / Trunk Assessment: Normal   Communication Communication Communication: No difficulties   Cognition Arousal/Alertness: Awake/alert Behavior During Therapy: Anxious, WFL for tasks assessed/performed Overall Cognitive Status: Impaired/Different from baseline Area of Impairment: Attention, Orientation, Memory, Following commands, Awareness                 Orientation Level: Situation Current Attention Level: Focused Memory: Decreased short-term memory Following Commands: Follows one step commands with increased time   Awareness: Emergent   General Comments: pt unble to recall reason for admission, needing increased clarification of timeline for symptoms and PLOF, pt initially stating she is going to stay with her son in [facility] for 3 months, then saying [place]. Decreased safety awareness, attempting to abandon RW x3 during session     General Comments  VSS on 1L O2    Exercises     Shoulder Instructions      Home Living Family/patient expects to be discharged to:: Private residence Living Arrangements: Children (son) Available Help at Discharge: Family;Available PRN/intermittently Type of Home: House Home Access: Level entry     Home Layout: One level     Bathroom Shower/Tub: Walk-in shower         Home Equipment: Tub bench;Rollator (4 wheels)   Additional Comments: spouse passed ~3 weeks ago, then she moved in with her son, normally wears O2 at home, 2L       Prior Functioning/Environment Prior Level of Function : Independent/Modified Independent             Mobility Comments: uses rollator at all times ADLs Comments: does ADLs, was doing IADLs until she moved in with son (~3 weeks ago)        OT Problem List: Decreased range of motion;Decreased strength;Decreased activity tolerance;Impaired balance (sitting and/or standing);Decreased safety awareness;Decreased knowledge of use of DME or AE;Decreased cognition      OT Treatment/Interventions: Self-care/ADL training;Therapeutic exercise;Energy conservation;DME and/or AE instruction;Therapeutic activities;Balance training;Patient/family education;Cognitive remediation/compensation    OT Goals(Current goals can be found in the care plan section) Acute Rehab OT Goals Patient Stated Goal: none stated OT Goal Formulation: With patient Time For Goal Achievement: 09/20/22 Potential to Achieve Goals: Good ADL Goals Pt Will Perform Upper Body Dressing: with modified independence;sitting Pt Will Perform Lower Body Dressing: with supervision;sit to/from stand;sitting/lateral leans Pt Will Transfer to Toilet: with modified independence;ambulating;regular height toilet Pt Will Perform Tub/Shower Transfer: Shower transfer;shower seat;ambulating;rolling walker;with modified independence  OT Frequency: Min 2X/week    Co-evaluation              AM-PAC OT "6 Clicks" Daily Activity     Outcome Measure Help from another person eating meals?: None Help from another person taking care of personal grooming?: A Little Help from another person toileting, which includes using toliet, bedpan, or urinal?: A Little Help from another  person bathing (including washing, rinsing, drying)?: A Lot Help from another person to put on and taking off regular upper body clothing?: A Little Help from another person to put on and taking off regular lower body clothing?: A Lot 6 Click Score: 17   End of Session  Equipment Utilized During Treatment: Gait belt;Rolling walker (2 wheels) Nurse Communication: Mobility status  Activity Tolerance: Patient tolerated treatment well Patient left: with call bell/phone within reach;in chair;with chair alarm set  OT Visit Diagnosis: Unsteadiness on feet (R26.81);Other abnormalities of gait and mobility (R26.89);Muscle weakness (generalized) (M62.81);Other symptoms and signs involving cognitive function                Time: 0830-0909 OT Time Calculation (min): 39 min Charges:  OT General Charges $OT Visit: 1 Visit OT Evaluation $OT Eval Moderate Complexity: 1 Mod OT Treatments $Self Care/Home Management : 8-22 mins $Therapeutic Activity: 8-22 mins  Lynnda Child, OTD, OTR/L Acute Rehab [phone]) [phone] - [place] 09/06/2022, 10:13 AM

## 2022-09-06 NOTE — Progress Notes (Signed)
ANTICOAGULATION CONSULT NOTE - Follow Up Consult  Pharmacy Consult  to transition from Warfarin to Eliquis Indication: history of pulmonary embolus ,  h/o  heterozyguous Factor V Leiden mutation and antiphospholipid antibody.   Allergies  Allergen Reactions   Aspirin     unknown   Caffeine     unknown   Codeine Hives   Hydroxyzine     Other reaction(s): Other (See Comments) Nightmares and shaky hands   Lipitor [Atorvastatin] Other (See Comments)    Headache, numbness   Ampicillin Rash   Ketoconazole Rash    Topical cream caused rash   Penicillins Rash    Patient Measurements: Height: '5\' 6"'$  (167.6 cm) Weight: 82.6 kg (182 lb 3.2 oz) IBW/kg (Calculated) : 59.3   Labs: Recent Labs    09/04/22 1600 09/04/22 1822 09/06/22 0744  HGB 12.3  --   --   HCT 39.7  --   --   PLT 280  --   --   APTT 31  --   --   LABPROT 19.2*  --  20.9*  INR 1.6*  --  1.8*  CREATININE 0.78  --   --   TROPONINIHS 13 13  --     Estimated Creatinine Clearance: 54.7 mL/min (by C-G formula based on SCr of 0.78 mg/dL).  Assessment: 88 year old female on warfarin prior to admission for history of PE and factor V leiden mutation admitted with right sided facial droop and dysarthria.> TIA per neuro consult.  Pharmacy consulted for warfarin dosing. Outpatient warfarin dose 6 mg daily (42 mg weekly). Patient reports last dose of warfarin 6 mg prior to admission was 9/13. Patient did not have a warfarin dose 9/14 and had a dose of 7.5 mg on 9/15. INR today is subtherapeutic at 1.8.   09/06/22 PM:  Dr. Candiss Norse contacted pharmacist, he said patent is willing to change anticoagulation  to Eliquis,  due to difficulty had keeping INR therapeutic on Warfarin for hx PE, factor V mutation (heterozygous- ok to use DOAC) and h/o antiphospholipid syndrome (okay to use DOAC  for APS if difficulty achieving therapeutic INR with warfarin). I discussed  with Dr. Candiss Norse , Eliquis in these settings and my plan to dose eliquis at  5 mg BID. He confirms no acute thrombus this admission and he wants to proceed with plan to switch over to Eliquis 5 mg BID.   INR is currently 1.8,  less that 2.0, thus appropriate to start Eliquis today.    Goal of Therapy:  INR 2-3 Monitor platelets by anticoagulation protocol: Yes   Plan:  Discontinue Warfarin Start Eliquis 5 mg BID Monitor for s/sx of bleeding Will need copay check for Eliquis   Nicole Cella, RPh Clinical Pharmacist 09/06/2022 5:04 PM Please check AMION for all [place] phone numbers After 10:00 PM, call [place] [phone]

## 2022-09-06 NOTE — Evaluation (Signed)
Physical Therapy Evaluation Patient Details Name: Briana Hartman MRN: [id number] DOB: 15-Aug-1934 Today's Date: 09/06/2022  History of Present Illness  Pt is an 88 y/o F presenting to ED on 9/14 with R facial droop, dysarthria, and R sided pain, CT and MRI negative for acute abnormality. PMH inlcudes factor V leiden mutation with hx of PE, DM2, CAD with PCI (2022), HTN, HLD, CKD III, hypothyroidism, COPD, and GAD.  Clinical Impression   Pt presents with generalized weakness, impaired balance, dyspnea on exertion with accompanying desats requiring O2 (pt states she has been off home O2 x3 weeks), and decreased activity tolerance. Pt to benefit from acute PT to address deficits. Pt ambulated short hallway distance with use of RW, overall requires cuing assist for mobility. PT to progress mobility as tolerated, and will continue to follow acutely.         Recommendations for follow up therapy are one component of a multi-disciplinary discharge planning process, led by the attending physician.  Recommendations may be updated based on patient status, additional functional criteria and insurance authorization.  Follow Up Recommendations Home health PT      Assistance Recommended at Discharge Intermittent Supervision/Assistance  Patient can return home with the following  A little help with walking and/or transfers;A little help with bathing/dressing/bathroom    Equipment Recommendations None recommended by PT  Recommendations for Other Services       Functional Status Assessment Patient has had a recent decline in their functional status and demonstrates the ability to make significant improvements in function in a reasonable and predictable amount of time.     Precautions / Restrictions Precautions Precautions: Fall Restrictions Weight Bearing Restrictions: No      Mobility  Bed Mobility Overal bed mobility: Needs Assistance Bed Mobility: Supine to Sit, Sit to Supine      Supine to sit: Min guard, HOB elevated Sit to supine: Min guard, HOB elevated   General bed mobility comments: increased time, use of bedrails    Transfers Overall transfer level: Needs assistance Equipment used: Rolling walker (2 wheels) Transfers: Sit to/from Stand Sit to Stand: Min guard           General transfer comment: increased time to rise, no physical assist    Ambulation/Gait Ambulation/Gait assistance: Min assist Gait Distance (Feet): 100 Feet Assistive device: Rolling walker (2 wheels) Gait Pattern/deviations: Step-through pattern, Decreased stride length, Trunk flexed Gait velocity: decr     General Gait Details: occasional steadying assist, cues for upright posture, placement in RW, and managing RW.  Stairs            Wheelchair Mobility    Modified Rankin (Stroke Patients Only)       Balance Overall balance assessment: Needs assistance Sitting-balance support: Feet supported Sitting balance-Leahy Scale: Good     Standing balance support: Bilateral upper extremity supported, Reliant on assistive device for balance, During functional activity Standing balance-Leahy Scale: Poor Standing balance comment: reliant on external support                             Pertinent Vitals/Pain Pain Assessment Pain Assessment: No/denies pain    Home Living Family/patient expects to be discharged to:: Private residence Living Arrangements: Children (son) Available Help at Discharge: Family;Available PRN/intermittently Type of Home: House Home Access: Level entry       Home Layout: One level Home Equipment: Tub bench;Rollator (4 wheels) Additional Comments: spouse passed ~3 weeks ago, then  she moved in with her son, normally wears O2 at home, 2L    Prior Function Prior Level of Function : Independent/Modified Independent             Mobility Comments: uses rollator at all times ADLs Comments: does ADLs, was doing IADLs until she  moved in with son (~3 weeks ago)     Hand Dominance   Dominant Hand: Right    Extremity/Trunk Assessment   Upper Extremity Assessment Upper Extremity Assessment: Defer to OT evaluation    Lower Extremity Assessment Lower Extremity Assessment: Generalized weakness    Cervical / Trunk Assessment Cervical / Trunk Assessment: Normal  Communication   Communication: No difficulties  Cognition Arousal/Alertness: Awake/alert Behavior During Therapy: Anxious, WFL for tasks assessed/performed Overall Cognitive Status: Impaired/Different from baseline Area of Impairment: Attention, Orientation, Memory, Following commands, Awareness                 Orientation Level: Situation Current Attention Level: Focused Memory: Decreased short-term memory Following Commands: Follows one step commands with increased time   Awareness: Emergent   General Comments: Requires safety cues for use of RW        General Comments General comments (skin integrity, edema, etc.): pt states she has been off O2 x3 weeks, brief dip to 85% on RA so placed back on 1LO2 ranging from 87-91% during gait. HRmax 141 bpm during mobility, there briefly and returned to low 100s with rest.    Exercises     Assessment/Plan    PT Assessment Patient needs continued PT services  PT Problem List Decreased strength;Decreased mobility;Decreased balance;Decreased knowledge of use of DME;Decreased safety awareness;Decreased activity tolerance;Cardiopulmonary status limiting activity       PT Treatment Interventions DME instruction;Therapeutic activities;Gait training;Therapeutic exercise;Patient/family education;Balance training;Stair training;Functional mobility training    PT Goals (Current goals can be found in the Care Plan section)  Acute Rehab PT Goals PT Goal Formulation: With patient Time For Goal Achievement: 09/20/22 Potential to Achieve Goals: Good    Frequency Min 3X/week     Co-evaluation                AM-PAC PT "6 Clicks" Mobility  Outcome Measure Help needed turning from your back to your side while in a flat bed without using bedrails?: A Little Help needed moving from lying on your back to sitting on the side of a flat bed without using bedrails?: A Little Help needed moving to and from a bed to a chair (including a wheelchair)?: A Little Help needed standing up from a chair using your arms (e.g., wheelchair or bedside chair)?: A Little Help needed to walk in hospital room?: A Little Help needed climbing 3-5 steps with a railing? : A Little 6 Click Score: 18    End of Session Equipment Utilized During Treatment: Oxygen Activity Tolerance: Patient tolerated treatment well Patient left: in bed;with call bell/phone within reach;with bed alarm set Nurse Communication: Mobility status PT Visit Diagnosis: Other abnormalities of gait and mobility (R26.89)    Time: 3532-9924 PT Time Calculation (min) (ACUTE ONLY): 25 min   Charges:   PT Evaluation $PT Eval Low Complexity: 1 Low PT Treatments $Gait Training: 8-22 mins       Stacie Glaze, PT DPT Acute Rehabilitation Services Pager [phone]  Office [phone]   Briana Hartman 09/06/2022, 11:06 AM

## 2022-09-06 NOTE — Care Management Obs Status (Signed)
[facility] NOTIFICATION   Patient Details  Name: Briana Hartman MRN: [id number] Date of Birth: October 13, 1934   Medicare Observation Status Notification Given:  Yes    Carles Collet, RN 09/06/2022, 3:40 PM

## 2022-09-06 NOTE — Progress Notes (Signed)
  Echocardiogram 2D Echocardiogram has been performed.  [name] 09/06/2022, 10:00 AM

## 2022-09-06 NOTE — Progress Notes (Signed)
STROKE TEAM PROGRESS NOTE   SUBJECTIVE (INTERVAL HISTORY) No family is at the bedside.  Overall her condition is completely resolved.  Patient stated that her INR was high several days ago, she was instructed to stop Coumadin for 2 days and then restart on 60 mg.  However, on ER presentation, patient INR 1.6.  Today 1.8.  Discussed with patient regarding DOAC and Coumadin, she is waiting to start [place].   OBJECTIVE Temp:  [98 F (36.7 C)-98.1 F (36.7 C)] 98.1 F (36.7 C) (09/16 1312) Pulse Rate:  [50-61] 60 (09/16 1312) Cardiac Rhythm: Normal sinus rhythm (09/16 1033) Resp:  [17-20] 20 (09/16 1312) BP: (103-181)/(41-67) 103/41 (09/16 1312) SpO2:  [90 %-94 %] 94 % (09/16 1312)  Recent Labs  Lab 09/05/22 1544 09/05/22 2106 09/06/22 0832 09/06/22 1311 09/06/22 1612  GLUCAP 109* 110* 126* 135* 117*   Recent Labs  Lab 09/04/22 1600  NA 138  K 4.4  CL 105  CO2 25  GLUCOSE 95  BUN 15  CREATININE 0.78  CALCIUM 9.4   Recent Labs  Lab 09/04/22 1600  AST 18  ALT 10  ALKPHOS 37*  BILITOT 0.5  PROT 6.1*  ALBUMIN 3.4*   Recent Labs  Lab 09/04/22 1600  WBC 11.3*  NEUTROABS 6.8  HGB 12.3  HCT 39.7  MCV 88.8  PLT 280   No results for input(s): "CKTOTAL", "CKMB", "CKMBINDEX", "TROPONINI" in the last 168 hours. Recent Labs    09/04/22 1600 09/06/22 0744  LABPROT 19.2* 20.9*  INR 1.6* 1.8*   Recent Labs    09/04/22 1610  COLORURINE YELLOW  LABSPEC 1.015  PHURINE 8.5*  GLUCOSEU NEGATIVE  HGBUR TRACE*  BILIRUBINUR NEGATIVE  KETONESUR NEGATIVE  PROTEINUR NEGATIVE  NITRITE POSITIVE*  LEUKOCYTESUR NEGATIVE       Component Value Date/Time   CHOL 113 09/06/2022 0744   CHOL 223 (H) 01/26/2020 1621   TRIG 120 09/06/2022 0744   HDL 30 (L) 09/06/2022 0744   HDL 33 (L) 01/26/2020 1621   CHOLHDL 3.8 09/06/2022 0744   VLDL 24 09/06/2022 0744   LDLCALC 59 09/06/2022 0744   LDLCALC 93 01/26/2020 1621   Lab Results  Component Value Date   HGBA1C 6.5 (H)  09/06/2022      Component Value Date/Time   LABOPIA NONE DETECTED 09/04/2022 1610   COCAINSCRNUR NONE DETECTED 09/04/2022 1610   LABBENZ NONE DETECTED 09/04/2022 1610   AMPHETMU NONE DETECTED 09/04/2022 1610   THCU NONE DETECTED 09/04/2022 1610   LABBARB NONE DETECTED 09/04/2022 1610    Recent Labs  Lab 09/04/22 1600  ETH <10    I have personally reviewed the radiological images below and agree with the radiology interpretations.  ECHOCARDIOGRAM COMPLETE  Result Date: 09/06/2022    ECHOCARDIOGRAM REPORT   Patient Name:   EATHEL PAJAK Date of Exam: 09/06/2022 Medical Rec #:  [id number]           Height:       66.0 in Accession #:    [id number]          Weight:       182.2 lb Date of Birth:  16-Jan-1934           BSA:          1.922 m Patient Age:    37 years            BP:           124/51 mmHg Patient Gender: F  HR:           64 bpm. Exam Location:  Inpatient Procedure: 2D Echo, Cardiac Doppler and Color Doppler Indications:    TIA  History:        Patient has prior history of Echocardiogram examinations, most                 recent 02/20/2020. CAD, COPD; Risk Factors:Hypertension, Diabetes                 and Dyslipidemia. CKD. Hx pulmonary embolism.  Sonographer:    Clayton Lefort RDCS (AE) Referring Phys: [id number] [place]  1. Left ventricular ejection fraction, by estimation, is 60 to 65%. The left ventricle has normal function. The left ventricle has no regional wall motion abnormalities. There is moderate concentric left ventricular hypertrophy. Left ventricular diastolic parameters are consistent with Grade I diastolic dysfunction (impaired relaxation). Elevated left atrial pressure.  2. Right ventricular systolic function is normal. The right ventricular size is normal.  3. The mitral valve is normal in structure. No evidence of mitral valve regurgitation. No evidence of mitral stenosis.  4. The aortic valve is normal in structure. There is mild  calcification of the aortic valve. Aortic valve regurgitation is not visualized. Aortic valve sclerosis is present, with no evidence of aortic valve stenosis.  5. The inferior vena cava is normal in size with greater than 50% respiratory variability, suggesting right atrial pressure of 3 mmHg. FINDINGS  Left Ventricle: Left ventricular ejection fraction, by estimation, is 60 to 65%. The left ventricle has normal function. The left ventricle has no regional wall motion abnormalities. The left ventricular internal cavity size was normal in size. There is  moderate concentric left ventricular hypertrophy. Left ventricular diastolic parameters are consistent with Grade I diastolic dysfunction (impaired relaxation). Elevated left atrial pressure. Right Ventricle: The right ventricular size is normal. No increase in right ventricular wall thickness. Right ventricular systolic function is normal. Left Atrium: Left atrial size was normal in size. Right Atrium: Right atrial size was normal in size. Pericardium: There is no evidence of pericardial effusion. Mitral Valve: The mitral valve is normal in structure. No evidence of mitral valve regurgitation. No evidence of mitral valve stenosis. Tricuspid Valve: The tricuspid valve is normal in structure. Tricuspid valve regurgitation is not demonstrated. No evidence of tricuspid stenosis. Aortic Valve: The aortic valve is normal in structure. There is mild calcification of the aortic valve. Aortic valve regurgitation is not visualized. Aortic valve sclerosis is present, with no evidence of aortic valve stenosis. Aortic valve mean gradient  measures 3.0 mmHg. Aortic valve peak gradient measures 6.5 mmHg. Aortic valve area, by VTI measures 2.89 cm. Pulmonic Valve: The pulmonic valve was not well visualized. Pulmonic valve regurgitation is not visualized. No evidence of pulmonic stenosis. Aorta: The aortic root is normal in size and structure. Venous: The inferior vena cava is  normal in size with greater than 50% respiratory variability, suggesting right atrial pressure of 3 mmHg. IAS/Shunts: No atrial level shunt detected by color flow Doppler.  LEFT VENTRICLE PLAX 2D LVIDd:         4.90 cm   Diastology LVIDs:         3.20 cm   LV e' medial:    5.22 cm/s LV PW:         1.44 cm   LV E/e' medial:  14.4 LV IVS:        1.40 cm   LV e' lateral:  5.22 cm/s LVOT diam:     2.30 cm   LV E/e' lateral: 14.4 LV SV:         84 LV SV Index:   44 LVOT Area:     4.15 cm  RIGHT VENTRICLE             IVC RV Basal diam:  3.00 cm     IVC diam: 1.70 cm RV S prime:     14.00 cm/s TAPSE (M-mode): 2.0 cm LEFT ATRIUM             Index        RIGHT ATRIUM           Index LA diam:        3.30 cm 1.72 cm/m   RA Area:     13.20 cm LA Vol (A2C):   44.1 ml 22.94 ml/m  RA Volume:   31.00 ml  16.13 ml/m LA Vol (A4C):   37.9 ml 19.72 ml/m LA Biplane Vol: 44.9 ml 23.36 ml/m  AORTIC VALVE AV Area (Vmax):    3.18 cm AV Area (Vmean):   3.06 cm AV Area (VTI):     2.89 cm AV Vmax:           127.00 cm/s AV Vmean:          84.100 cm/s AV VTI:            0.290 m AV Peak Grad:      6.5 mmHg AV Mean Grad:      3.0 mmHg LVOT Vmax:         97.30 cm/s LVOT Vmean:        61.900 cm/s LVOT VTI:          0.202 m LVOT/AV VTI ratio: 0.70  AORTA Ao Root diam: 3.40 cm Ao Asc diam:  3.50 cm MITRAL VALVE MV Area (PHT): 2.55 cm    SHUNTS MV Decel Time: 298 msec    Systemic VTI:  0.20 m MV E velocity: 75.20 cm/s  Systemic Diam: 2.30 cm MV A velocity: 85.90 cm/s MV E/A ratio:  0.88 Mihai Croitoru MD Electronically signed by Sanda Klein MD Signature Date/Time: 09/06/2022/2:35:54 PM    Final    MR BRAIN WO CONTRAST  Result Date: 09/05/2022 CLINICAL DATA:  Acute neuro deficit.  Rule out stroke EXAM: MRI HEAD WITHOUT CONTRAST TECHNIQUE: Multiplanar, multiecho pulse sequences of the brain and surrounding structures were obtained without intravenous contrast. COMPARISON:  CT head 09/04/2022 FINDINGS: Brain: No acute infarction,  hemorrhage, hydrocephalus, extra-axial collection or mass lesion. Mild white matter changes with patchy periventricular deep white matter hyperintensities bilaterally. Brainstem intact. Vascular: Normal arterial flow voids Skull and upper cervical spine: No focal skeletal abnormality Sinuses/Orbits: 9 mm mass medial to the right orbit as noted on CT yesterday. Possible hemangioma. Right cataract extraction paranasal sinuses clear. Other: None IMPRESSION: Negative for acute infarct Mild chronic microvascular ischemic change 9 mm mass medial to the right globe. Electronically Signed   By: Franchot Gallo M.D.   On: 09/05/2022 18:57   CT ANGIO HEAD NECK W WO CM  Result Date: 09/04/2022 CLINICAL DATA:  Stroke follow-up. Right-sided facial droop with slurred speech. EXAM: CT ANGIOGRAPHY HEAD AND NECK TECHNIQUE: Multidetector CT imaging of the head and neck was performed using the standard protocol during bolus administration of intravenous contrast. Multiplanar CT image reconstructions and MIPs were obtained to evaluate the vascular anatomy. Carotid stenosis measurements (when applicable) are obtained utilizing NASCET criteria, using the  distal internal carotid diameter as the denominator. RADIATION DOSE REDUCTION: This exam was performed according to the departmental dose-optimization program which includes automated exposure control, adjustment of the mA and/or kV according to patient size and/or use of iterative reconstruction technique. CONTRAST:  53m OMNIPAQUE IOHEXOL 350 MG/ML SOLN COMPARISON:  Head CT 12/11/2012 FINDINGS: CT HEAD FINDINGS Brain: There is no evidence of an acute infarct, intracranial hemorrhage, mass, midline shift, or extra-axial fluid collection. The ventricles and sulci are normal for age. Hypodensities in the cerebral white matter bilaterally are unchanged and nonspecific but compatible with mild chronic small vessel ischemic disease. Vascular: Calcified atherosclerosis at the skull base.  No hyperdense vessel. Skull: No fracture or suspicious osseous lesion. Sinuses/Orbits: Paranasal sinuses and mastoid air cells are clear. Right cataract extraction. 9 mm mass in the anterior aspect of the right orbit medial to the globe which was also present in 2020 though is slightly larger today, partially enhances on the CTA images, and appears to be associated with a vein, overall benign in appearance and potentially reflecting a cavernous venous malformation or varix. Other: None. Review of the MIP images confirms the above findings CTA NECK FINDINGS Aortic arch: Standard 3 vessel aortic arch with mild atherosclerotic plaque. Patent brachiocephalic and subclavian arteries with mixed soft and calcified plaque in the proximal aspects of both subclavian arteries resulting in luminal irregularity but no flow limiting stenosis. Right carotid system: Patent with a small amount of mixed calcified and soft plaque in the carotid bulb. No evidence of a significant stenosis or dissection. Tortuous common and proximal internal carotid arteries. Left carotid system: Patent with a small amount of predominantly calcified plaque at the carotid bifurcation. No evidence of a significant stenosis or dissection. Tortuous mid cervical ICA. Vertebral arteries: Patent without evidence of a significant stenosis or dissection. Codominant. Skeleton: Advanced disc degeneration in the lower cervical spine. Asymmetrically advanced right-sided cervical facet arthrosis, greatest at C4-5. Moderate spinal stenosis at C6-7 due to prominent posterior vertebral osteophytes. Other neck: No evidence of cervical lymphadenopathy or mass. Upper chest: Clear lung apices. Review of the MIP images confirms the above findings CTA HEAD FINDINGS Anterior circulation: The internal carotid arteries are patent from skull base to carotid termini with mild-to-moderate atherosclerotic plaque bilaterally not resulting in significant stenosis. ACAs and MCAs are  patent without evidence of a proximal branch occlusion or a significant A1 or left M1 stenosis. There is a severe distal right M1 stenosis, and there is a moderate proximal right A2 stenosis. No aneurysm is identified. Posterior circulation: The intracranial vertebral arteries are patent to the basilar with mild atherosclerotic irregularity but no significant stenosis. Patent PICA, AICA, and SCA origins are seen bilaterally. The basilar artery is widely patent. There are small posterior communicating arteries bilaterally. Both PCAs are patent with atherosclerotic irregularity involving the branch vessels but no evidence of a flow limiting proximal stenosis. There is an early bifurcation of the left PCA. No aneurysm is identified. Venous sinuses: Patent. Anatomic variants: None. Review of the MIP images confirms the above findings IMPRESSION: 1. No evidence of acute intracranial abnormality. 2. No large vessel occlusion. 3. Intracranial atherosclerosis including severe distal right M1 and moderate right A2 stenoses. 4. Mild cervical carotid artery atherosclerosis without significant stenosis. 5. 9 mm mass in the anterior right orbit, benign in appearance and potentially reflecting a cavernous venous malformation or varix. 6. Aortic Atherosclerosis (ICD10-I70.0). Electronically Signed   By: ALogan BoresM.D.   On: 09/04/2022 18:03   DG  Chest Portable 1 View  Result Date: 09/04/2022 CLINICAL DATA:  Chest pain EXAM: PORTABLE CHEST 1 VIEW COMPARISON:  05/31/2013, 02/22/2021 FINDINGS: Cardiomegaly. Aortic atherosclerosis. Central pulmonary vessels appears slightly enlarged. No pleural effusion or pneumothorax. IMPRESSION: 1. Cardiomegaly without edema or pleural effusion 2. Prominent central pulmonary vessels suggest arterial hypertension Electronically Signed   By: Donavan Foil M.D.   On: 09/04/2022 16:56     PHYSICAL EXAM  Temp:  [98 F (36.7 C)-98.1 F (36.7 C)] 98.1 F (36.7 C) (09/16 1312) Pulse Rate:   [50-61] 60 (09/16 1312) Resp:  [17-20] 20 (09/16 1312) BP: (103-181)/(41-67) 103/41 (09/16 1312) SpO2:  [90 %-94 %] 94 % (09/16 1312)  General - Well nourished, well developed, in no apparent distress.  Ophthalmologic - fundi not visualized due to noncooperation.  Left eye mild proptosis  Cardiovascular - Regular rhythm and rate.  Mental Status -  Level of arousal and orientation to time, place, and person were intact. Language including expression, naming, repetition, comprehension was assessed and found intact. Fund of Knowledge was assessed and was intact.  Cranial Nerves II - XII - II - Visual field intact OU. III, IV, VI - Extraocular movements intact. V - Facial sensation intact bilaterally. VII - Facial movement intact bilaterally. VIII - Hearing & vestibular intact bilaterally. X - Palate elevates symmetrically. XI - Chin turning & shoulder shrug intact bilaterally. XII - Tongue protrusion intact.  Motor Strength - The patient's strength was normal in all extremities and pronator drift was absent.  Bulk was normal and fasciculations were absent.   Motor Tone - Muscle tone was assessed at the neck and appendages and was normal.  Reflexes - The patient's reflexes were symmetrical in all extremities and she had no pathological reflexes.  Sensory - Light touch, temperature/pinprick were assessed and were symmetrical.    Coordination - The patient had normal movements in the hands and feet with no ataxia or dysmetria.  Tremor was absent.  Gait and Station - deferred.   ASSESSMENT/PLAN Ms. MORNINGSTAR TOFT is a 88 y.o. female with history of hypertension, diabetes, CKD 3A, PE and factor V [place] deficiency on Coumadin, MVP admitted for right-sided facial droop and slurred speech for 30 minutes associate with chest tingling. No tPA given due to symptom resolved.  TIA:  left brain TIA, could be related to subtherapeutic INR on Coumadin CT no acute abnormality CTA head  and neck right M1 severe stenosis, right A2 moderate stenosis MRI no acute infarct 2D Echo EF 60 to 65% LDL 59 HgbA1c 6.5 UDS negative Coumadin for VTE prophylaxis warfarin daily prior to admission, now on warfarin daily.  Discussed with patient and Dr. Candiss Norse, will change to Eliquis given difficulty INR control with Coumadin Patient counseled to be compliant with her antithrombotic medications Ongoing aggressive stroke risk factor management Therapy recommendations: Home health PT/OT Disposition: Pending  Diabetes HgbA1c 6.5 goal < 7.0 Controlled CBG monitoring SSI DM education and close PCP follow up  Hypertension BP fluctuating On Norvasc 10, Avapro 150, Imdur 60, metoprolol 150 Long term BP goal normal tensive  Hyperlipidemia Home meds: Fenofibrate LDL 59, goal < 70 History of statin intolerance Now on fenofibrate and Zetia Continue at discharge  Right eye mass CT and CTA showed 9 mm right orbital mass,?  Cavernous venous formation MRI again reported 9 mm mass medial to the right globe Patient did complaining of intermittent right eye pressure feeling Recommend outpatient follow-up with ophthalmology.  Other Stroke Risk Factors Advanced age  PE with factor V Leiden deficiency, on Coumadin PTA, now on Eliquis  Other Active Problems CKD 3A, creatinine 0.70 Mild leukocytosis, WBC 11.3  Hospital day # 0  Neurology will sign off. Please call with questions. Pt will follow up with stroke clinic NP at [facility] in about 4 weeks. Thanks for the consult.  Rosalin Hawking, MD PhD Stroke Neurology 09/06/2022 6:34 PM    To contact Stroke Continuity provider, please refer to [email or web address] After hours, contact General Neurology

## 2022-09-06 NOTE — Progress Notes (Signed)
PROGRESS NOTE                                                                                                                                                                                                             Patient Demographics:    Briana Hartman, is a 88 y.o. female, DOB - May 30, 1934, HGD:[id number]  Outpatient Primary MD for the patient is Briana Barman, MD    LOS - 0  Admit date - 09/04/2022    Chief Complaint  Patient presents with   Facial Droop       Brief Narrative (HPI from H&P)    88 y.o. female with medical history significant of factor V leiden mutation with hx of PE, T2DM, CAD with PCI in 2022, HTN, HLD, CKD stage 3a, hypothyroidism, COPD, GAD who presented to ED with complaints of right sided facial droop and dysarthria lasting for about 30 minutes. Her son states around 2:30 in the afternoon they were in line to get medicine at [facility] and she had sudden right sided facial droop and she had dysarthric speech with pain down the right side of her body.  She subsequently went to an outlying ER where she was present for about a day thereafter transferred to [facility] for stroke/TIA work-up.   Subjective:    Briana Hartman today has, No headache, No chest pain, No abdominal pain - No Nausea, No new weakness tingling or numbness, no SOB.   Assessment  & Plan :   TIA (transient ischemic attack) - 88 year old who presented to ED with history of acute onset right facial drooping and dysarthria that resolved after 30 minutes.  He has been seen by neurology.  Work-up underway, MRI brain, CT head and CTA head and neck are nonacute except for incidental hemangioma findings as below, PT-OT-speech to see the patient, echo pending, acceptable LDL and A1c.  Symptoms almost completely resolved.  Complete stroke work-up.  Continue Coumadin, INR slightly subtherapeutic.  Pharmacy monitoring.  Permissive  hypertension for 24 hours.  Hypertensive urgency -  Allow for permissive HTN in setting of possible CVA with goal <220/110 for 24 hours.  CAD S/P percutaneous coronary angioplasty - Followed by cardiology at [place]. History of NSTEMI/cath in 02/08/21 with DES to OM2, PTCA distal RCA.  Continue on beta-blocker only.  Has listed statin  allergy with acceptable LDL.  Continue home dose Tricor + Zetia.  UTI (urinary tract infection) - Dysuria and frequency with UA with + nitrite, continue Rocephin and follow urine culture results.   Hyperlipidemia - Continue zetia and tricor , LDL at goal here, LDL few months ago was 104.  Has statin allergy.   Factor 5 Leiden mutation, heterozygous with hx of PE - Continue coumadin per pharmacy, INR subtherapeutic at 1.6.   Stage 2 moderate COPD by GOLD classification ([place]) - Stable, no signs of exacerbation, Continue flovent daily and SABA prn   Stage 3a chronic kidney disease ([place]) - At baseline, continue to monitor   Hypothyroidism - TSH wnl 06/2022, Continue synthroid 14mg daily   Incidental intracranial 9 mm hemangioma.  Outpatient follow-up with neurology and neurosurgery.   Bilateral hearing loss.  Supportive care.    Type 2 diabetes mellitus without complication, without long-term current use of insulin (HCC) -  SSI  Lab Results  Component Value Date   HGBA1C 6.5 (H) 09/06/2022   CBG (last 3)  Recent Labs    09/05/22 1544 09/05/22 2106 09/06/22 0832  GLUCAP 109* 110* 126*         Condition - Fair  Family Communication  :     Husband Briana [phone] 09/06/2022 message left at 9:41 AM Daughter Briana [phone] 09/06/2022   Code Status :  DNR  Consults  :  Neuro  PUD Prophylaxis :    Procedures  :     MRI - Negative for acute infarct Mild chronic microvascular ischemic change 9 mm mass medial to the right globe.   TTE  CT - CTA H&N - 1. No evidence of acute intracranial abnormality. 2. No large vessel  occlusion. 3. Intracranial atherosclerosis including severe distal right M1 and moderate right A2 stenoses. 4. Mild cervical carotid artery atherosclerosis without significant stenosis. 5. 9 mm mass in the anterior right orbit, benign in appearance and potentially reflecting a cavernous venous malformation or varix. 6. Aortic Atherosclerosis      Disposition Plan  :    Status is: Observation  DVT Prophylaxis  :  Heparin      Lab Results  Component Value Date   PLT 280 09/04/2022    Diet :  Diet Order             Diet heart healthy/carb modified Room service appropriate? Yes; Fluid consistency: Thin  Diet effective now                    Inpatient Medications  Scheduled Meds:  amLODipine  10 mg Oral Daily   ezetimibe  10 mg Oral Daily   fenofibrate  160 mg Oral Daily   fluticasone furoate-vilanterol  1 puff Inhalation Daily   furosemide  40 mg Oral Daily   gabapentin  300 mg Oral TID   insulin aspart  0-9 Units Subcutaneous TID WC   irbesartan  150 mg Oral Daily   isosorbide mononitrate  60 mg Oral Daily   levothyroxine  50 mcg Oral Daily   metoprolol succinate  150 mg Oral Daily   pantoprazole  40 mg Oral Daily   pravastatin  40 mg Oral q1800   Warfarin - Pharmacist Dosing Inpatient   Does not apply q1600   Continuous Infusions:  cefTRIAXone (ROCEPHIN)  IV 1 g (09/05/22 1736)   PRN Meds:.acetaminophen **OR** acetaminophen (TYLENOL) oral liquid 160 mg/5 mL **OR** acetaminophen, hydrALAZINE, ondansetron (ZOFRAN) IV, senna-docusate  Antibiotics  :  Anti-infectives (From admission, onward)    Start     Dose/Rate Route Frequency Ordered Stop   09/05/22 1730  cefTRIAXone (ROCEPHIN) 1 g in sodium chloride 0.9 % 100 mL IVPB        1 g 200 mL/hr over 30 Minutes Intravenous Every 24 hours 09/05/22 1640     09/04/22 2015  cefTRIAXone (ROCEPHIN) 1 g in sodium chloride 0.9 % 100 mL IVPB        1 g 200 mL/hr over 30 Minutes Intravenous  Once 09/04/22 2005 09/04/22  2049        Time Spent in minutes  30   Briana Hartman M.D on 09/06/2022 at 9:38 AM  To page go to [email or web address]   [facility] -  Office  [phone]  See all Orders from today for further details    Objective:   Vitals:   09/05/22 1540 09/05/22 2049 09/05/22 2300 09/06/22 0316  BP: (!) 187/70 (!) 181/62 138/67 (!) 124/51  Pulse: 69 60 61 (!) 50  Resp: '15 20 17 17  '$ Temp: 98.1 F (36.7 C) 98 F (36.7 C) 98 F (36.7 C) 98.1 F (36.7 C)  TempSrc: Oral Temporal Oral Oral  SpO2: 95% 91% 90% 93%  Weight:      Height:        Wt Readings from Last 3 Encounters:  09/04/22 82.6 kg  03/12/21 86.7 kg  03/02/20 88.9 kg     Intake/Output Summary (Last 24 hours) at 09/06/2022 0938 Last data filed at 09/06/2022 2841 Gross per 24 hour  Intake 290 ml  Output 850 ml  Net -560 ml     Physical Exam  Awake Alert, No new F.N deficits, Normal affect [place].AT,PERRAL Supple Neck, No JVD,   Symmetrical Chest wall movement, Good air movement bilaterally, CTAB RRR,No Gallops,Rubs or new Murmurs,  +ve B.Sounds, Abd Soft, No tenderness,   No Cyanosis, Clubbing or edema       Data Review:    CBC Recent Labs  Lab 09/04/22 1600  WBC 11.3*  HGB 12.3  HCT 39.7  PLT 280  MCV 88.8  MCH 27.5  MCHC 31.0  RDW 14.2  LYMPHSABS 3.1  MONOABS 1.0  EOSABS 0.2  BASOSABS 0.1    Electrolytes Recent Labs  Lab 09/04/22 1600 09/06/22 0744  NA 138  --   K 4.4  --   CL 105  --   CO2 25  --   GLUCOSE 95  --   BUN 15  --   CREATININE 0.78  --   CALCIUM 9.4  --   AST 18  --   ALT 10  --   ALKPHOS 37*  --   BILITOT 0.5  --   ALBUMIN 3.4*  --   INR 1.6* 1.8*  HGBA1C  --  6.5*    ------------------------------------------------------------------------------------------------------------------ Recent Labs    09/06/22 0744  CHOL 113  HDL 30*  LDLCALC 59  TRIG 120  CHOLHDL 3.8    Lab Results  Component Value Date   HGBA1C 6.5 (H) 09/06/2022    No results for  input(s): "TSH", "T4TOTAL", "T3FREE", "THYROIDAB" in the last 72 hours.  Invalid input(s): "FREET3" ------------------------------------------------------------------------------------------------------------------ ID Labs Recent Labs  Lab 09/04/22 1600  WBC 11.3*  PLT 280  CREATININE 0.78    Radiology Reports MR BRAIN WO CONTRAST  Result Date: 09/05/2022 CLINICAL DATA:  Acute neuro deficit.  Rule out stroke EXAM: MRI HEAD WITHOUT CONTRAST TECHNIQUE: Multiplanar, multiecho pulse sequences of the brain and surrounding  structures were obtained without intravenous contrast. COMPARISON:  CT head 09/04/2022 FINDINGS: Brain: No acute infarction, hemorrhage, hydrocephalus, extra-axial collection or mass lesion. Mild white matter changes with patchy periventricular deep white matter hyperintensities bilaterally. Brainstem intact. Vascular: Normal arterial flow voids Skull and upper cervical spine: No focal skeletal abnormality Sinuses/Orbits: 9 mm mass medial to the right orbit as noted on CT yesterday. Possible hemangioma. Right cataract extraction paranasal sinuses clear. Other: None IMPRESSION: Negative for acute infarct Mild chronic microvascular ischemic change 9 mm mass medial to the right globe. Electronically Signed   By: Franchot Gallo M.D.   On: 09/05/2022 18:57   CT ANGIO HEAD NECK W WO CM  Result Date: 09/04/2022 CLINICAL DATA:  Stroke follow-up. Right-sided facial droop with slurred speech. EXAM: CT ANGIOGRAPHY HEAD AND NECK TECHNIQUE: Multidetector CT imaging of the head and neck was performed using the standard protocol during bolus administration of intravenous contrast. Multiplanar CT image reconstructions and MIPs were obtained to evaluate the vascular anatomy. Carotid stenosis measurements (when applicable) are obtained utilizing NASCET criteria, using the distal internal carotid diameter as the denominator. RADIATION DOSE REDUCTION: This exam was performed according to the  departmental dose-optimization program which includes automated exposure control, adjustment of the mA and/or kV according to patient size and/or use of iterative reconstruction technique. CONTRAST:  89m OMNIPAQUE IOHEXOL 350 MG/ML SOLN COMPARISON:  Head CT 12/11/2012 FINDINGS: CT HEAD FINDINGS Brain: There is no evidence of an acute infarct, intracranial hemorrhage, mass, midline shift, or extra-axial fluid collection. The ventricles and sulci are normal for age. Hypodensities in the cerebral white matter bilaterally are unchanged and nonspecific but compatible with mild chronic small vessel ischemic disease. Vascular: Calcified atherosclerosis at the skull base. No hyperdense vessel. Skull: No fracture or suspicious osseous lesion. Sinuses/Orbits: Paranasal sinuses and mastoid air cells are clear. Right cataract extraction. 9 mm mass in the anterior aspect of the right orbit medial to the globe which was also present in 2020 though is slightly larger today, partially enhances on the CTA images, and appears to be associated with a vein, overall benign in appearance and potentially reflecting a cavernous venous malformation or varix. Other: None. Review of the MIP images confirms the above findings CTA NECK FINDINGS Aortic arch: Standard 3 vessel aortic arch with mild atherosclerotic plaque. Patent brachiocephalic and subclavian arteries with mixed soft and calcified plaque in the proximal aspects of both subclavian arteries resulting in luminal irregularity but no flow limiting stenosis. Right carotid system: Patent with a small amount of mixed calcified and soft plaque in the carotid bulb. No evidence of a significant stenosis or dissection. Tortuous common and proximal internal carotid arteries. Left carotid system: Patent with a small amount of predominantly calcified plaque at the carotid bifurcation. No evidence of a significant stenosis or dissection. Tortuous mid cervical ICA. Vertebral arteries: Patent  without evidence of a significant stenosis or dissection. Codominant. Skeleton: Advanced disc degeneration in the lower cervical spine. Asymmetrically advanced right-sided cervical facet arthrosis, greatest at C4-5. Moderate spinal stenosis at C6-7 due to prominent posterior vertebral osteophytes. Other neck: No evidence of cervical lymphadenopathy or mass. Upper chest: Clear lung apices. Review of the MIP images confirms the above findings CTA HEAD FINDINGS Anterior circulation: The internal carotid arteries are patent from skull base to carotid termini with mild-to-moderate atherosclerotic plaque bilaterally not resulting in significant stenosis. ACAs and MCAs are patent without evidence of a proximal branch occlusion or a significant A1 or left M1 stenosis. There is a severe  distal right M1 stenosis, and there is a moderate proximal right A2 stenosis. No aneurysm is identified. Posterior circulation: The intracranial vertebral arteries are patent to the basilar with mild atherosclerotic irregularity but no significant stenosis. Patent PICA, AICA, and SCA origins are seen bilaterally. The basilar artery is widely patent. There are small posterior communicating arteries bilaterally. Both PCAs are patent with atherosclerotic irregularity involving the branch vessels but no evidence of a flow limiting proximal stenosis. There is an early bifurcation of the left PCA. No aneurysm is identified. Venous sinuses: Patent. Anatomic variants: None. Review of the MIP images confirms the above findings IMPRESSION: 1. No evidence of acute intracranial abnormality. 2. No large vessel occlusion. 3. Intracranial atherosclerosis including severe distal right M1 and moderate right A2 stenoses. 4. Mild cervical carotid artery atherosclerosis without significant stenosis. 5. 9 mm mass in the anterior right orbit, benign in appearance and potentially reflecting a cavernous venous malformation or varix. 6. Aortic Atherosclerosis  (ICD10-I70.0). Electronically Signed   By: Logan Bores M.D.   On: 09/04/2022 18:03   DG Chest Portable 1 View  Result Date: 09/04/2022 CLINICAL DATA:  Chest pain EXAM: PORTABLE CHEST 1 VIEW COMPARISON:  05/31/2013, 02/22/2021 FINDINGS: Cardiomegaly. Aortic atherosclerosis. Central pulmonary vessels appears slightly enlarged. No pleural effusion or pneumothorax. IMPRESSION: 1. Cardiomegaly without edema or pleural effusion 2. Prominent central pulmonary vessels suggest arterial hypertension Electronically Signed   By: Donavan Foil M.D.   On: 09/04/2022 16:56

## 2022-09-06 NOTE — Progress Notes (Signed)
ANTICOAGULATION CONSULT NOTE - Follow Up Consult  Pharmacy Consult for Warfarin Indication: history of pulmonary embolus   Allergies  Allergen Reactions   Aspirin     unknown   Caffeine     unknown   Codeine Hives   Hydroxyzine     Other reaction(s): Other (See Comments) Nightmares and shaky hands   Lipitor [Atorvastatin] Other (See Comments)    Headache, numbness   Ampicillin Rash   Ketoconazole Rash    Topical cream caused rash   Penicillins Rash    Patient Measurements: Height: '5\' 6"'$  (167.6 cm) Weight: 82.6 kg (182 lb 3.2 oz) IBW/kg (Calculated) : 59.3   Labs: Recent Labs    09/04/22 1600 09/04/22 1822 09/06/22 0744  HGB 12.3  --   --   HCT 39.7  --   --   PLT 280  --   --   APTT 31  --   --   LABPROT 19.2*  --  20.9*  INR 1.6*  --  1.8*  CREATININE 0.78  --   --   TROPONINIHS 13 13  --     Estimated Creatinine Clearance: 54.7 mL/min (by C-G formula based on SCr of 0.78 mg/dL).  Assessment: 88 year old female on warfarin prior to admission for history of PE and factor V leiden mutation admitted with right sided facial droop and dysarthria. Pharmacy consulted for warfarin dosing. Outpatient warfarin dose 6 mg daily (42 mg weekly). Patient reports last dose of warfarin 6 mg prior to admission was 9/13. Patient did not have a warfarin dose 9/14 and had a dose of 7.5 mg on 9/15. INR today is subtherapeutic at 1.8.   Goal of Therapy:  INR 2-3 Monitor platelets by anticoagulation protocol: Yes   Plan:  Warfarin 6 mg po x 1 dose tonight Daily INR  [employer], Pharm.D PGY1 Pharmacy Resident 09/06/2022 11:23 AM

## 2022-09-07 DIAGNOSIS — G459 Transient cerebral ischemic attack, unspecified: Secondary | ICD-10-CM | POA: Diagnosis not present

## 2022-09-07 LAB — BASIC METABOLIC PANEL
Anion gap: 8 (ref 5–15)
BUN: 19 mg/dL (ref 8–23)
CO2: 27 mmol/L (ref 22–32)
Calcium: 8.7 mg/dL — ABNORMAL LOW (ref 8.9–10.3)
Chloride: 105 mmol/L (ref 98–111)
Creatinine, Ser: 0.92 mg/dL (ref 0.44–1.00)
GFR, Estimated: >60 mL/min (ref >60)
Glucose, Bld: 95 mg/dL (ref 70–99)
Potassium: 3.8 mmol/L (ref 3.5–5.1)
Sodium: 140 mmol/L (ref 135–145)

## 2022-09-07 LAB — PROTIME-INR
INR: 2.2 — ABNORMAL HIGH (ref 0.8–1.2)
Prothrombin Time: 23.9 seconds — ABNORMAL HIGH (ref 11.4–15.2)

## 2022-09-07 LAB — GLUCOSE, CAPILLARY: Glucose-Capillary: 111 mg/dL — ABNORMAL HIGH (ref 70–99)

## 2022-09-07 LAB — TSH: TSH: 1.787 u[IU]/mL (ref 0.350–4.500)

## 2022-09-07 MED ORDER — ISOSORBIDE MONONITRATE ER 60 MG PO TB24: 60.0000 mg | ORAL_TABLET | Freq: Every day | ORAL | 0 refills | Status: DC

## 2022-09-07 MED ORDER — HYDRALAZINE HCL 20 MG/ML IJ SOLN: 10.0000 mg | Freq: Four times a day (QID) | INTRAMUSCULAR | Status: DC | PRN

## 2022-09-07 MED ORDER — APIXABAN 5 MG PO TABS: 5.0000 mg | ORAL_TABLET | Freq: Two times a day (BID) | ORAL | 0 refills | Status: AC

## 2022-09-07 MED ORDER — METOPROLOL SUCCINATE ER 100 MG PO TB24: 100.0000 mg | ORAL_TABLET | Freq: Every day | ORAL | Status: DC

## 2022-09-07 MED ORDER — CARVEDILOL 6.25 MG PO TABS: 6.2500 mg | ORAL_TABLET | Freq: Two times a day (BID) | ORAL | Status: DC

## 2022-09-07 MED ORDER — METOPROLOL SUCCINATE ER 50 MG PO TB24: 50.0000 mg | ORAL_TABLET | Freq: Every day | ORAL | Status: DC

## 2022-09-07 MED ORDER — CARVEDILOL 6.25 MG PO TABS: 6.2500 mg | ORAL_TABLET | Freq: Two times a day (BID) | ORAL | 0 refills | Status: AC

## 2022-09-07 MED ORDER — EZETIMIBE 10 MG PO TABS: 10.0000 mg | ORAL_TABLET | Freq: Every day | ORAL | 0 refills | Status: AC

## 2022-09-07 NOTE — Discharge Summary (Signed)
Briana Hartman DIY:[id number] DOB: 1934/08/09 DOA: 09/04/2022  PCP: [name], MD  Admit date: 09/04/2022  Discharge date: 09/07/2022  Admitted From: Home   Disposition:  Home   Recommendations for Outpatient Follow-up:   Follow up with PCP in 1-2 weeks  PCP Please obtain BMP/CBC, 2 view CXR in 1week,  (see Discharge instructions)   PCP Please follow up on the following pending results: Monitor blood pressure, LDL, A1c and electrolytes closely   Home Health: PT, OT, SLP   Equipment/Devices: as below  Consultations: Neuro Discharge Condition: Stable    CODE STATUS: Full    Diet Recommendation: Heart Healthy Low Carb    Chief Complaint  Patient presents with   Facial Droop     Brief history of present illness from the day of admission and additional interim summary    88 y.o. female with medical history significant of factor V leiden mutation with hx of PE, T2DM, CAD with PCI in 2022, HTN, HLD, CKD stage 3a, hypothyroidism, COPD, GAD who presented to ED with complaints of right sided facial droop and dysarthria lasting for about 30 minutes. Her son states around 2:30 in the afternoon they were in line to get medicine at [facility] and she had sudden right sided facial droop and she had dysarthric speech with pain down the right side of her body.  She subsequently went to an outlying ER where she was present for about a day thereafter transferred to [facility] for stroke/TIA work-up.                                                                 Hospital Course    TIA (transient ischemic attack) - 88 year old who presented to ED with history of acute onset right facial drooping and dysarthria that resolved after 30 minutes.  She has been seen by neurology.  Work-up underway, MRI brain, CT head and  CTA head and neck, echocardiogram were nonacute except for incidental hemangioma findings as below, stable A1c, LDL was above goal and she has listed statin allergy hence placed on combination of Zetia and Tricor.  She has underlying factor V Leyden mutation which makes her hypercoagulable and INR was subtherapeutic likely the cause of her TIA, per patient and family frequently runs subtherapeutic INR.  With consent switch to Eliquis.  PCP to monitor.  Currently no symptoms will be discharged home with outpatient PCP and neurology follow-up.     Factor 5 Leiden mutation, heterozygous with hx of PE - INR subtherapeutic at 1.6 at the time of admission per patient and family frequently runs subtherapeutic at very hard to control, switch to Eliquis 1 month free coupon provided thereafter per PCP.  Hypertensive urgency - blood pressure stable on present regimen.  PCP to continue to monitor  and adjust.   CAD S/P percutaneous coronary angioplasty - Followed by cardiology at [facility]. History of NSTEMI/cath in 02/08/21 with DES to OM2, PTCA distal RCA.  Continue home regimen for secondary prevention beta-blocker switched from Lopressor to Coreg as she had mild bradycardic episodes on Lopressor, TSH was stable, has listed statin allergy with acceptable LDL.  Continue home dose Tricor + Zetia.  Request PCP to monitor blood pressure and heart rate on a close basis upon admission her heart rate was running in mid 50s hence beta-blocker has been switched, may need further dose titration.   UTI (urinary tract infection) - Dysuria and frequency with UA with + nitrite, was treated did with Rocephin 3 doses.   Hyperlipidemia - placed on the combination of zetia and tricor , Has statin allergy.   Stage 2 moderate COPD by GOLD classification ([place]) - Stable, no signs of exacerbation, Continue flovent daily and SABA prn    Stage 3a chronic kidney disease ([place]) - At baseline, continue to monitor    Hypothyroidism - TSH  wnl 06/2022, Continue synthroid 11mg daily    Incidental intracranial 9 mm hemangioma.  Outpatient follow-up with neurology and neurosurgery.  We will request PCP to arrange.   Bilateral hearing loss.  Supportive care.    Type 2 diabetes mellitus without complication, without long-term current use of insulin (HCC) -continue home regimen.  Lab Results  Component Value Date   HGBA1C 6.5 (H) 09/06/2022   Lab Results  Component Value Date   CHOL 113 09/06/2022   HDL 30 (L) 09/06/2022   LDLCALC 59 09/06/2022   LDLDIRECT 113.4 05/31/2013   TRIG 120 09/06/2022   CHOLHDL 3.8 09/06/2022    Discharge diagnosis     Principal Problem:   TIA (transient ischemic attack) Active Problems:   CAD S/P percutaneous coronary angioplasty   Hypertensive urgency   UTI (urinary tract infection)   Type 2 diabetes mellitus without complication, without long-term current use of insulin (HCC)   Hyperlipidemia   Factor 5 Leiden mutation, heterozygous with hx of PE   Stage 2 moderate COPD by GOLD classification ([place]   Stage 3a chronic kidney disease ([place]   Hypothyroidism   orbital mass     Discharge instructions    Discharge Instructions     Ambulatory referral to Neurology   Complete by: As directed    Follow up with stroke clinic NP (Jessica Vanschaick or CCecille Rubin if both not available, [facility] Dauer [facility]   Discharge instructions   Complete by: As directed    Follow with Primary MD JElba Barman MD in 7 days   Get CBC, CMP, Magnesium -  checked next visit within 1 week by Primary MD   Activity: As tolerated with Full fall precautions use walker/cane & assistance as needed  Disposition Home   Diet: Heart Healthy Low Carb  Special Instructions: If you have smoked or chewed Tobacco  in the last 2 yrs please stop smoking, stop any regular Alcohol  and or any Recreational drug use.  On your next visit with your primary care  physician please Get Medicines reviewed and adjusted.  Please request your Prim.MD to go over all Hospital Tests and Procedure/Radiological results at the follow up, please get all Hospital records sent to your Prim MD by signing hospital release before you go home.  If you experience worsening of your admission symptoms, develop shortness of breath, life threatening emergency, suicidal  or homicidal thoughts you must seek medical attention immediately by calling 911 or calling your MD immediately  if symptoms less severe.  You Must read complete instructions/literature along with all the possible adverse reactions/side effects for all the Medicines you take and that have been prescribed to you. Take any new Medicines after you have completely understood and accpet all the possible adverse reactions/side effects.   Increase activity slowly   Complete by: As directed        Discharge Medications   Allergies as of 09/07/2022       Reactions   Aspirin    unknown   Caffeine    unknown   Codeine Hives   Hydroxyzine    Other reaction(s): Other (See Comments) Nightmares and shaky hands   Lipitor [atorvastatin] Other (See Comments)   Headache, numbness   Ampicillin Rash   Ketoconazole Rash   Topical cream caused rash   Penicillins Rash        Medication List     STOP taking these medications    metoprolol tartrate 50 MG tablet Commonly known as: LOPRESSOR   warfarin 6 MG tablet Commonly known as: COUMADIN       TAKE these medications    Abdominal Binder/Elastic XL Misc 1 Device by Does not apply route as needed (to prevent abdominal pain from hernia).   albuterol 108 (90 Base) MCG/ACT inhaler Commonly known as: VENTOLIN HFA Inhale 1 puff into the lungs every 6 (six) hours as needed for shortness of breath.   amLODipine 10 MG tablet Commonly known as: NORVASC   apixaban 5 MG Tabs tablet Commonly known as: ELIQUIS Take 1 tablet (5 mg total) by mouth 2 (two) times  daily. Kindly provide 1 month supply with the free 1 month coupon, future supplies per PCP   B-D SINGLE USE SWABS REGULAR Pads   Breo Ellipta 100-25 MCG/ACT Aepb Generic drug: fluticasone furoate-vilanterol Inhale 1 puff into the lungs daily.   carvedilol 6.25 MG tablet Commonly known as: Coreg Take 1 tablet (6.25 mg total) by mouth 2 (two) times daily.   ezetimibe 10 MG tablet Commonly known as: ZETIA Take 1 tablet (10 mg total) by mouth daily.   fenofibrate 145 MG tablet Commonly known as: Tricor Take 1 tablet (145 mg total) by mouth daily.   fluticasone 220 MCG/ACT inhaler Commonly known as: FLOVENT HFA Inhale 1 puff into the lungs daily as needed (For shortness of breath).   furosemide 40 MG tablet Commonly known as: LASIX Take 1 tablet (40 mg total) by mouth 2 (two) times daily. What changed: when to take this   gabapentin 300 MG capsule Commonly known as: NEURONTIN Take 300 mg by mouth 3 (three) times daily.   isosorbide mononitrate 60 MG 24 hr tablet Commonly known as: IMDUR Take 1 tablet (60 mg total) by mouth daily.   levothyroxine 50 MCG tablet Commonly known as: SYNTHROID Take 50 mcg by mouth daily before breakfast. What changed: Another medication with the same name was removed. Continue taking this medication, and follow the directions you see here.   metFORMIN 500 MG tablet Commonly known as: GLUCOPHAGE Take 500 mg by mouth daily with breakfast.   nitroGLYCERIN 0.4 MG SL tablet Commonly known as: NITROSTAT Place 1 tablet (0.4 mg total) under the tongue every 5 (five) minutes as needed. What changed: reasons to take this   omeprazole 20 MG capsule Commonly known as: PRILOSEC Take 20 mg by mouth daily.   telmisartan 40 MG tablet Commonly  known as: MICARDIS Take 1 tablet (40 mg total) by mouth daily.   True Metrix Air Glucose Meter w/Device Kit   True Metrix Blood Glucose Test test strip Generic drug: glucose blood TEST TWO TIMES DAILY    TRUEplus Lancets 33G Misc               Durable Medical Equipment  (From admission, onward)           Start     Ordered   09/06/22 1620  For home use only DME Shower stool  Once        09/06/22 1619             Follow-up Information     [facility] Follow up.   Specialty: Home Health Services Contact information: [place] [phone]         [facility]. Schedule an appointment as soon as possible for a visit in 1 month(s).   Specialty: Neurology Why: stroke clinic Contact information: [address] [place] [phone]        [name], MD. Schedule an appointment as soon as possible for a visit in 1 week(s).   Specialty: Family Medicine Contact information: [place] [address] [phone]         Judith Part, MD. Schedule an appointment as soon as possible for a visit in 1 week(s).   Specialty: Neurosurgery Contact information: [place] [address] [phone]                 Major procedures and Radiology Reports - PLEASE review detailed and final reports thoroughly  -       ECHOCARDIOGRAM COMPLETE  Result Date: 09/06/2022    ECHOCARDIOGRAM REPORT   Patient Name:   NALINA YEATMAN Date of Exam: 09/06/2022 Medical Rec #:  [id number]           Height:       66.0 in Accession #:    [id number]          Weight:       182.2 lb Date of Birth:  Mar 06, 1934           BSA:          1.922 m Patient Age:    45 years            BP:           124/51 mmHg Patient Gender: F                   HR:           64 bpm. Exam Location:  Inpatient Procedure: 2D Echo, Cardiac Doppler and Color Doppler Indications:    TIA  History:        Patient has prior history of Echocardiogram examinations, most                 recent 02/20/2020. CAD, COPD; Risk Factors:Hypertension, Diabetes                 and  Dyslipidemia. CKD. Hx pulmonary embolism.  Sonographer:    Clayton Lefort RDCS (AE) Referring Phys: [id number] [place]  1. Left ventricular ejection fraction, by estimation, is 60 to 65%. The left ventricle has normal function. The left ventricle has no regional wall motion abnormalities. There is moderate concentric left ventricular hypertrophy. Left ventricular diastolic  parameters are consistent with Grade I diastolic dysfunction (impaired relaxation). Elevated left atrial pressure.  2. Right ventricular systolic function is normal. The right ventricular size is normal.  3. The mitral valve is normal in structure. No evidence of mitral valve regurgitation. No evidence of mitral stenosis.  4. The aortic valve is normal in structure. There is mild calcification of the aortic valve. Aortic valve regurgitation is not visualized. Aortic valve sclerosis is present, with no evidence of aortic valve stenosis.  5. The inferior vena cava is normal in size with greater than 50% respiratory variability, suggesting right atrial pressure of 3 mmHg. FINDINGS  Left Ventricle: Left ventricular ejection fraction, by estimation, is 60 to 65%. The left ventricle has normal function. The left ventricle has no regional wall motion abnormalities. The left ventricular internal cavity size was normal in size. There is  moderate concentric left ventricular hypertrophy. Left ventricular diastolic parameters are consistent with Grade I diastolic dysfunction (impaired relaxation). Elevated left atrial pressure. Right Ventricle: The right ventricular size is normal. No increase in right ventricular wall thickness. Right ventricular systolic function is normal. Left Atrium: Left atrial size was normal in size. Right Atrium: Right atrial size was normal in size. Pericardium: There is no evidence of pericardial effusion. Mitral Valve: The mitral valve is normal in structure. No evidence of mitral valve regurgitation. No evidence of  mitral valve stenosis. Tricuspid Valve: The tricuspid valve is normal in structure. Tricuspid valve regurgitation is not demonstrated. No evidence of tricuspid stenosis. Aortic Valve: The aortic valve is normal in structure. There is mild calcification of the aortic valve. Aortic valve regurgitation is not visualized. Aortic valve sclerosis is present, with no evidence of aortic valve stenosis. Aortic valve mean gradient  measures 3.0 mmHg. Aortic valve peak gradient measures 6.5 mmHg. Aortic valve area, by VTI measures 2.89 cm. Pulmonic Valve: The pulmonic valve was not well visualized. Pulmonic valve regurgitation is not visualized. No evidence of pulmonic stenosis. Aorta: The aortic root is normal in size and structure. Venous: The inferior vena cava is normal in size with greater than 50% respiratory variability, suggesting right atrial pressure of 3 mmHg. IAS/Shunts: No atrial level shunt detected by color flow Doppler.  LEFT VENTRICLE PLAX 2D LVIDd:         4.90 cm   Diastology LVIDs:         3.20 cm   LV e' medial:    5.22 cm/s LV PW:         1.44 cm   LV E/e' medial:  14.4 LV IVS:        1.40 cm   LV e' lateral:   5.22 cm/s LVOT diam:     2.30 cm   LV E/e' lateral: 14.4 LV SV:         84 LV SV Index:   44 LVOT Area:     4.15 cm  RIGHT VENTRICLE             IVC RV Basal diam:  3.00 cm     IVC diam: 1.70 cm RV S prime:     14.00 cm/s TAPSE (M-mode): 2.0 cm LEFT ATRIUM             Index        RIGHT ATRIUM           Index LA diam:        3.30 cm 1.72 cm/m   RA Area:     13.20 cm LA Vol (  A2C):   44.1 ml 22.94 ml/m  RA Volume:   31.00 ml  16.13 ml/m LA Vol (A4C):   37.9 ml 19.72 ml/m LA Biplane Vol: 44.9 ml 23.36 ml/m  AORTIC VALVE AV Area (Vmax):    3.18 cm AV Area (Vmean):   3.06 cm AV Area (VTI):     2.89 cm AV Vmax:           127.00 cm/s AV Vmean:          84.100 cm/s AV VTI:            0.290 m AV Peak Grad:      6.5 mmHg AV Mean Grad:      3.0 mmHg LVOT Vmax:         97.30 cm/s LVOT Vmean:         61.900 cm/s LVOT VTI:          0.202 m LVOT/AV VTI ratio: 0.70  AORTA Ao Root diam: 3.40 cm Ao Asc diam:  3.50 cm MITRAL VALVE MV Area (PHT): 2.55 cm    SHUNTS MV Decel Time: 298 msec    Systemic VTI:  0.20 m MV E velocity: 75.20 cm/s  Systemic Diam: 2.30 cm MV A velocity: 85.90 cm/s MV E/A ratio:  0.88 Mihai Croitoru MD Electronically signed by Sanda Klein MD Signature Date/Time: 09/06/2022/2:35:54 PM    Final    MR BRAIN WO CONTRAST  Result Date: 09/05/2022 CLINICAL DATA:  Acute neuro deficit.  Rule out stroke EXAM: MRI HEAD WITHOUT CONTRAST TECHNIQUE: Multiplanar, multiecho pulse sequences of the brain and surrounding structures were obtained without intravenous contrast. COMPARISON:  CT head 09/04/2022 FINDINGS: Brain: No acute infarction, hemorrhage, hydrocephalus, extra-axial collection or mass lesion. Mild white matter changes with patchy periventricular deep white matter hyperintensities bilaterally. Brainstem intact. Vascular: Normal arterial flow voids Skull and upper cervical spine: No focal skeletal abnormality Sinuses/Orbits: 9 mm mass medial to the right orbit as noted on CT yesterday. Possible hemangioma. Right cataract extraction paranasal sinuses clear. Other: None IMPRESSION: Negative for acute infarct Mild chronic microvascular ischemic change 9 mm mass medial to the right globe. Electronically Signed   By: Franchot Gallo M.D.   On: 09/05/2022 18:57   CT ANGIO HEAD NECK W WO CM  Result Date: 09/04/2022 CLINICAL DATA:  Stroke follow-up. Right-sided facial droop with slurred speech. EXAM: CT ANGIOGRAPHY HEAD AND NECK TECHNIQUE: Multidetector CT imaging of the head and neck was performed using the standard protocol during bolus administration of intravenous contrast. Multiplanar CT image reconstructions and MIPs were obtained to evaluate the vascular anatomy. Carotid stenosis measurements (when applicable) are obtained utilizing NASCET criteria, using the distal internal carotid diameter  as the denominator. RADIATION DOSE REDUCTION: This exam was performed according to the departmental dose-optimization program which includes automated exposure control, adjustment of the mA and/or kV according to patient size and/or use of iterative reconstruction technique. CONTRAST:  28m OMNIPAQUE IOHEXOL 350 MG/ML SOLN COMPARISON:  Head CT 12/11/2012 FINDINGS: CT HEAD FINDINGS Brain: There is no evidence of an acute infarct, intracranial hemorrhage, mass, midline shift, or extra-axial fluid collection. The ventricles and sulci are normal for age. Hypodensities in the cerebral white matter bilaterally are unchanged and nonspecific but compatible with mild chronic small vessel ischemic disease. Vascular: Calcified atherosclerosis at the skull base. No hyperdense vessel. Skull: No fracture or suspicious osseous lesion. Sinuses/Orbits: Paranasal sinuses and mastoid air cells are clear. Right cataract extraction. 9 mm mass in the anterior aspect of the right  orbit medial to the globe which was also present in 2020 though is slightly larger today, partially enhances on the CTA images, and appears to be associated with a vein, overall benign in appearance and potentially reflecting a cavernous venous malformation or varix. Other: None. Review of the MIP images confirms the above findings CTA NECK FINDINGS Aortic arch: Standard 3 vessel aortic arch with mild atherosclerotic plaque. Patent brachiocephalic and subclavian arteries with mixed soft and calcified plaque in the proximal aspects of both subclavian arteries resulting in luminal irregularity but no flow limiting stenosis. Right carotid system: Patent with a small amount of mixed calcified and soft plaque in the carotid bulb. No evidence of a significant stenosis or dissection. Tortuous common and proximal internal carotid arteries. Left carotid system: Patent with a small amount of predominantly calcified plaque at the carotid bifurcation. No evidence of a  significant stenosis or dissection. Tortuous mid cervical ICA. Vertebral arteries: Patent without evidence of a significant stenosis or dissection. Codominant. Skeleton: Advanced disc degeneration in the lower cervical spine. Asymmetrically advanced right-sided cervical facet arthrosis, greatest at C4-5. Moderate spinal stenosis at C6-7 due to prominent posterior vertebral osteophytes. Other neck: No evidence of cervical lymphadenopathy or mass. Upper chest: Clear lung apices. Review of the MIP images confirms the above findings CTA HEAD FINDINGS Anterior circulation: The internal carotid arteries are patent from skull base to carotid termini with mild-to-moderate atherosclerotic plaque bilaterally not resulting in significant stenosis. ACAs and MCAs are patent without evidence of a proximal branch occlusion or a significant A1 or left M1 stenosis. There is a severe distal right M1 stenosis, and there is a moderate proximal right A2 stenosis. No aneurysm is identified. Posterior circulation: The intracranial vertebral arteries are patent to the basilar with mild atherosclerotic irregularity but no significant stenosis. Patent PICA, AICA, and SCA origins are seen bilaterally. The basilar artery is widely patent. There are small posterior communicating arteries bilaterally. Both PCAs are patent with atherosclerotic irregularity involving the branch vessels but no evidence of a flow limiting proximal stenosis. There is an early bifurcation of the left PCA. No aneurysm is identified. Venous sinuses: Patent. Anatomic variants: None. Review of the MIP images confirms the above findings IMPRESSION: 1. No evidence of acute intracranial abnormality. 2. No large vessel occlusion. 3. Intracranial atherosclerosis including severe distal right M1 and moderate right A2 stenoses. 4. Mild cervical carotid artery atherosclerosis without significant stenosis. 5. 9 mm mass in the anterior right orbit, benign in appearance and  potentially reflecting a cavernous venous malformation or varix. 6. Aortic Atherosclerosis (ICD10-I70.0). Electronically Signed   By: Logan Bores M.D.   On: 09/04/2022 18:03   DG Chest Portable 1 View  Result Date: 09/04/2022 CLINICAL DATA:  Chest pain EXAM: PORTABLE CHEST 1 VIEW COMPARISON:  05/31/2013, 02/22/2021 FINDINGS: Cardiomegaly. Aortic atherosclerosis. Central pulmonary vessels appears slightly enlarged. No pleural effusion or pneumothorax. IMPRESSION: 1. Cardiomegaly without edema or pleural effusion 2. Prominent central pulmonary vessels suggest arterial hypertension Electronically Signed   By: Donavan Foil M.D.   On: 09/04/2022 16:56    Micro Results     Recent Results (from the past 240 hour(s))  Resp Panel by RT-PCR (Flu A&B, Covid) Anterior Nasal Swab     Status: None   Collection Time: 09/04/22  4:10 PM   Specimen: Anterior Nasal Swab  Result Value Ref Range Status   SARS Coronavirus 2 by RT PCR NEGATIVE NEGATIVE Final    Comment: (NOTE) SARS-CoV-2 target nucleic acids are NOT DETECTED.  The SARS-CoV-2 RNA is generally detectable in upper respiratory specimens during the acute phase of infection. The lowest concentration of SARS-CoV-2 viral copies this assay can detect is 138 copies/mL. A negative result does not preclude SARS-Cov-2 infection and should not be used as the sole basis for treatment or other patient management decisions. A negative result may occur with  improper specimen collection/handling, submission of specimen other than nasopharyngeal swab, presence of viral mutation(s) within the areas targeted by this assay, and inadequate number of viral copies(<138 copies/mL). A negative result must be combined with clinical observations, patient history, and epidemiological information. The expected result is Negative.  Fact Sheet for Patients:  EntrepreneurPulse.com.au  Fact Sheet for Healthcare Providers:   IncredibleEmployment.be  This test is no t yet approved or cleared by the [place] FDA and  has been authorized for detection and/or diagnosis of SARS-CoV-2 by FDA under an Emergency Use Authorization (EUA). This EUA will remain  in effect (meaning this test can be used) for the duration of the COVID-19 declaration under Section 564(b)(1) of the Act, 21 U.S.C.section 360bbb-3(b)(1), unless the authorization is terminated  or revoked sooner.       Influenza A by PCR NEGATIVE NEGATIVE Final   Influenza B by PCR NEGATIVE NEGATIVE Final    Comment: (NOTE) The Xpert Xpress SARS-CoV-2/FLU/RSV plus assay is intended as an aid in the diagnosis of influenza from Nasopharyngeal swab specimens and should not be used as a sole basis for treatment. Nasal washings and aspirates are unacceptable for Xpert Xpress SARS-CoV-2/FLU/RSV testing.  Fact Sheet for Patients: EntrepreneurPulse.com.au  Fact Sheet for Healthcare Providers: IncredibleEmployment.be  This test is not yet approved or cleared by the [place] FDA and has been authorized for detection and/or diagnosis of SARS-CoV-2 by FDA under an Emergency Use Authorization (EUA). This EUA will remain in effect (meaning this test can be used) for the duration of the COVID-19 declaration under Section 564(b)(1) of the Act, 21 U.S.C. section 360bbb-3(b)(1), unless the authorization is terminated or revoked.  Performed at [facility], [address] [place], [place] [address]   Urine Culture     Status: Abnormal   Collection Time: 09/05/22  4:39 PM   Specimen: Urine, Clean Catch  Result Value Ref Range Status   Specimen Description URINE, CLEAN CATCH  Final   Special Requests NONE  Final   Culture (A)  Final    <10,000 COLONIES/mL INSIGNIFICANT GROWTH Performed at [facility], [place]. [address] [place], [place] [address]    Report Status 09/06/2022 FINAL   Final    Today   Subjective    Brentney Goldbach today has no headache,no chest abdominal pain,no new weakness tingling or numbness, feels much better wants to go home today.    Objective   Blood pressure (!) 121/45, pulse (!) 50, temperature 98.4 F (36.9 C), temperature source Oral, resp. rate 14, height 5' 6"  (1.676 m), weight 82.6 kg, SpO2 96 %.   Intake/Output Summary (Last 24 hours) at 09/07/2022 0836 Last data filed at 09/07/2022 0400 Gross per 24 hour  Intake 120 ml  Output 850 ml  Net -730 ml    Exam  Awake Alert, No new F.N deficits,    [place].AT,PERRAL Supple Neck,   Symmetrical Chest wall movement, Good air movement bilaterally, CTAB RRR,No Gallops,   +ve B.Sounds, Abd Soft, Non tender,  No Cyanosis, Clubbing or edema    Data Review   Recent Labs  Lab 09/04/22 1600  WBC 11.3*  HGB 12.3  HCT 39.7  PLT 280  MCV 88.8  MCH 27.5  MCHC 31.0  RDW 14.2  LYMPHSABS 3.1  MONOABS 1.0  EOSABS 0.2  BASOSABS 0.1    Recent Labs  Lab 09/04/22 1600 09/06/22 0744 09/07/22 0631  NA 138  --  140  K 4.4  --  3.8  CL 105  --  105  CO2 25  --  27  GLUCOSE 95  --  95  BUN 15  --  19  CREATININE 0.78  --  0.92  CALCIUM 9.4  --  8.7*  AST 18  --   --   ALT 10  --   --   ALKPHOS 37*  --   --   BILITOT 0.5  --   --   ALBUMIN 3.4*  --   --   INR 1.6* 1.8* 2.2*  TSH  --   --  1.787  HGBA1C  --  6.5*  --     Total Time in preparing paper work, data evaluation and todays exam - 65 minutes  [name] M.D on 09/07/2022 at 8:36 AM  [facility]

## 2022-09-07 NOTE — TOC Transition Note (Signed)
Transition of Care [facility]) - CM/SW Discharge Note   Patient Details  Name: Briana Hartman MRN: [id number] Date of Birth: 1934/07/27  [facility]) CM/SW Contact:  Carles Collet, RN Phone Number: 09/07/2022, 8:31 AM   Clinical Narrative:     Kaylyn Layer of DC. Family declined shower seat yesterday. Provided with 30 day eliquis card No other TOC needs identified    Barriers to Discharge: Continued Medical Work up   Patient Goals and CMS Choice Patient states their goals for this hospitalization and ongoing recovery are:: return home CMS Medicare.gov Compare Post Acute Care list provided to:: Patient Choice offered to / list presented to : Patient  Discharge Placement                       Discharge Plan and Services   Discharge Planning Services: CM Consult Post Acute Care Choice: Home Health          DME Arranged: Shower stool DME Agency: [employer] Date DME Agency Contacted: 09/06/22 Time DME Agency Contacted: [phone] Representative spoke with at DME Agency: Mardene Celeste HH Arranged: PT, OT, Speech Therapy HH Agency: [place] Date [place]: 09/06/22 Time [place]: 80 Representative spoke with at [place]: Tommi Rumps  Social Determinants of Health (SDOH) Interventions Food Insecurity Interventions: Intervention Not Indicated Housing Interventions: Intervention Not Indicated Transportation Interventions: Intervention Not Indicated Utilities Interventions: Intervention Not Indicated   Readmission Risk Interventions     No data to display

## 2022-09-07 NOTE — Plan of Care (Signed)
  Problem: Education: Goal: Knowledge of General Education information will improve Description: Including pain rating scale, medication(s)/side effects and non-pharmacologic comfort measures Outcome: Progressing   Problem: Health Behavior/Discharge Planning: Goal: Ability to manage health-related needs will improve Outcome: Progressing   Problem: Clinical Measurements: Goal: Ability to maintain clinical measurements within normal limits will improve Outcome: Progressing Goal: Will remain free from infection Outcome: Progressing Goal: Diagnostic test results will improve Outcome: Progressing Goal: Respiratory complications will improve Outcome: Progressing Goal: Cardiovascular complication will be avoided Outcome: Progressing   Problem: Activity: Goal: Risk for activity intolerance will decrease Outcome: Progressing   Problem: Nutrition: Goal: Adequate nutrition will be maintained Outcome: Progressing   Problem: Coping: Goal: Level of anxiety will decrease Outcome: Progressing   Problem: Elimination: Goal: Will not experience complications related to bowel motility Outcome: Progressing Goal: Will not experience complications related to urinary retention Outcome: Progressing   Problem: Pain Managment: Goal: General experience of comfort will improve Outcome: Progressing   Problem: Safety: Goal: Ability to remain free from injury will improve Outcome: Progressing   Problem: Skin Integrity: Goal: Risk for impaired skin integrity will decrease Outcome: Progressing   Problem: Education: Goal: Ability to describe self-care measures that may prevent or decrease complications (Diabetes Survival Skills Education) will improve Outcome: Progressing Goal: Individualized Educational Video(s) Outcome: Progressing   Problem: Coping: Goal: Ability to adjust to condition or change in health will improve Outcome: Progressing   Problem: Fluid Volume: Goal: Ability to  maintain a balanced intake and output will improve Outcome: Progressing   Problem: Health Behavior/Discharge Planning: Goal: Ability to identify and utilize available resources and services will improve Outcome: Progressing Goal: Ability to manage health-related needs will improve Outcome: Progressing   Problem: Metabolic: Goal: Ability to maintain appropriate glucose levels will improve Outcome: Progressing   Problem: Nutritional: Goal: Maintenance of adequate nutrition will improve Outcome: Progressing Goal: Progress toward achieving an optimal weight will improve Outcome: Progressing   Problem: Skin Integrity: Goal: Risk for impaired skin integrity will decrease Outcome: Progressing   Problem: Tissue Perfusion: Goal: Adequacy of tissue perfusion will improve Outcome: Progressing   Problem: Education: Goal: Knowledge of disease or condition will improve Outcome: Progressing Goal: Knowledge of secondary prevention will improve (SELECT ALL) Outcome: Progressing Goal: Knowledge of patient specific risk factors will improve (INDIVIDUALIZE FOR PATIENT) Outcome: Progressing Goal: Individualized Educational Video(s) Outcome: Progressing   Problem: Health Behavior/Discharge Planning: Goal: Ability to manage health-related needs will improve Outcome: Progressing

## 2022-09-07 NOTE — Plan of Care (Signed)
  Problem: Education: Goal: Knowledge of General Education information will improve Description: Including pain rating scale, medication(s)/side effects and non-pharmacologic comfort measures Outcome: Completed/Met

## 2022-09-07 NOTE — Progress Notes (Signed)
Went over discharge paperwork with patient and family.  Gave family all paperwork including discount card for Eliquis (all put in envelope provided).  Assisted patient in wheelchair with family to vehicle at main entrance at [facility].  All belongings with patient.

## 2022-09-11 ENCOUNTER — Other Ambulatory Visit: Payer: Self-pay

## 2022-09-11 ENCOUNTER — Encounter (HOSPITAL_COMMUNITY): Payer: Self-pay

## 2022-09-11 ENCOUNTER — Observation Stay (HOSPITAL_COMMUNITY)
Admission: EM | Admit: 2022-09-11 | Discharge: 2022-09-12 | Disposition: A | Discharge status: Home Health | Payer: Medicare HMO | Attending: Internal Medicine | Admitting: Internal Medicine

## 2022-09-11 ENCOUNTER — Observation Stay (HOSPITAL_COMMUNITY): Payer: Medicare HMO

## 2022-09-11 ENCOUNTER — Emergency Department (HOSPITAL_COMMUNITY): Payer: Medicare HMO

## 2022-09-11 DIAGNOSIS — I129 Hypertensive chronic kidney disease with stage 1 through stage 4 chronic kidney disease, or unspecified chronic kidney disease: Secondary | ICD-10-CM | POA: Insufficient documentation

## 2022-09-11 DIAGNOSIS — E039 Hypothyroidism, unspecified: Secondary | ICD-10-CM | POA: Diagnosis present

## 2022-09-11 DIAGNOSIS — E119 Type 2 diabetes mellitus without complications: Secondary | ICD-10-CM

## 2022-09-11 DIAGNOSIS — D6851 Activated protein C resistance: Secondary | ICD-10-CM | POA: Diagnosis present

## 2022-09-11 DIAGNOSIS — E1122 Type 2 diabetes mellitus with diabetic chronic kidney disease: Secondary | ICD-10-CM | POA: Diagnosis not present

## 2022-09-11 DIAGNOSIS — Z9861 Coronary angioplasty status: Secondary | ICD-10-CM | POA: Diagnosis not present

## 2022-09-11 DIAGNOSIS — Z7984 Long term (current) use of oral hypoglycemic drugs: Secondary | ICD-10-CM | POA: Insufficient documentation

## 2022-09-11 DIAGNOSIS — Z85828 Personal history of other malignant neoplasm of skin: Secondary | ICD-10-CM | POA: Insufficient documentation

## 2022-09-11 DIAGNOSIS — J449 Chronic obstructive pulmonary disease, unspecified: Secondary | ICD-10-CM | POA: Diagnosis present

## 2022-09-11 DIAGNOSIS — G459 Transient cerebral ischemic attack, unspecified: Secondary | ICD-10-CM | POA: Diagnosis not present

## 2022-09-11 DIAGNOSIS — Z8673 Personal history of transient ischemic attack (TIA), and cerebral infarction without residual deficits: Secondary | ICD-10-CM | POA: Insufficient documentation

## 2022-09-11 DIAGNOSIS — J45909 Unspecified asthma, uncomplicated: Secondary | ICD-10-CM | POA: Insufficient documentation

## 2022-09-11 DIAGNOSIS — R4182 Altered mental status, unspecified: Secondary | ICD-10-CM | POA: Diagnosis present

## 2022-09-11 DIAGNOSIS — N1831 Chronic kidney disease, stage 3a: Secondary | ICD-10-CM | POA: Diagnosis not present

## 2022-09-11 DIAGNOSIS — E782 Mixed hyperlipidemia: Secondary | ICD-10-CM

## 2022-09-11 DIAGNOSIS — I2510 Atherosclerotic heart disease of native coronary artery without angina pectoris: Secondary | ICD-10-CM

## 2022-09-11 DIAGNOSIS — Z79899 Other long term (current) drug therapy: Secondary | ICD-10-CM | POA: Diagnosis not present

## 2022-09-11 DIAGNOSIS — Z7901 Long term (current) use of anticoagulants: Secondary | ICD-10-CM | POA: Insufficient documentation

## 2022-09-11 DIAGNOSIS — Z86711 Personal history of pulmonary embolism: Secondary | ICD-10-CM | POA: Insufficient documentation

## 2022-09-11 DIAGNOSIS — E785 Hyperlipidemia, unspecified: Secondary | ICD-10-CM | POA: Diagnosis present

## 2022-09-11 DIAGNOSIS — R4701 Aphasia: Secondary | ICD-10-CM | POA: Diagnosis not present

## 2022-09-11 DIAGNOSIS — I10 Essential (primary) hypertension: Secondary | ICD-10-CM

## 2022-09-11 LAB — COMPREHENSIVE METABOLIC PANEL
ALT: 12 U/L (ref 0–44)
AST: 17 U/L (ref 15–41)
Albumin: 3.3 g/dL — ABNORMAL LOW (ref 3.5–5.0)
Alkaline Phosphatase: 37 U/L — ABNORMAL LOW (ref 38–126)
Anion gap: 9 (ref 5–15)
BUN: 26 mg/dL — ABNORMAL HIGH (ref 8–23)
CO2: 29 mmol/L (ref 22–32)
Calcium: 9.5 mg/dL (ref 8.9–10.3)
Chloride: 100 mmol/L (ref 98–111)
Creatinine, Ser: 0.94 mg/dL (ref 0.44–1.00)
GFR, Estimated: 59 mL/min — ABNORMAL LOW (ref >60)
Glucose, Bld: 119 mg/dL — ABNORMAL HIGH (ref 70–99)
Potassium: 4 mmol/L (ref 3.5–5.1)
Sodium: 138 mmol/L (ref 135–145)
Total Bilirubin: 0.4 mg/dL (ref 0.3–1.2)
Total Protein: 6.3 g/dL — ABNORMAL LOW (ref 6.5–8.1)

## 2022-09-11 LAB — DIFFERENTIAL
Abs Immature Granulocytes: 0.08 10*3/uL — ABNORMAL HIGH (ref 0.00–0.07)
Basophils Absolute: 0.1 10*3/uL (ref 0.0–0.1)
Basophils Relative: 1 %
Eosinophils Absolute: 0.2 10*3/uL (ref 0.0–0.5)
Eosinophils Relative: 2 %
Immature Granulocytes: 1 %
Lymphocytes Relative: 28 %
Lymphs Abs: 3.1 10*3/uL (ref 0.7–4.0)
Monocytes Absolute: 0.9 10*3/uL (ref 0.1–1.0)
Monocytes Relative: 8 %
Neutro Abs: 6.8 10*3/uL (ref 1.7–7.7)
Neutrophils Relative %: 60 %

## 2022-09-11 LAB — CBC
HCT: 41.7 % (ref 36.0–46.0)
Hemoglobin: 13 g/dL (ref 12.0–15.0)
MCH: 28.4 pg (ref 26.0–34.0)
MCHC: 31.2 g/dL (ref 30.0–36.0)
MCV: 91.2 fL (ref 80.0–100.0)
Platelets: 310 10*3/uL (ref 150–400)
RBC: 4.57 MIL/uL (ref 3.87–5.11)
RDW: 14.2 % (ref 11.5–15.5)
WBC: 11.2 10*3/uL — ABNORMAL HIGH (ref 4.0–10.5)
nRBC: 0 % (ref 0.0–0.2)

## 2022-09-11 LAB — I-STAT CHEM 8, ED
BUN: 29 mg/dL — ABNORMAL HIGH (ref 8–23)
Calcium, Ion: 1.19 mmol/L (ref 1.15–1.40)
Chloride: 99 mmol/L (ref 98–111)
Creatinine, Ser: 0.9 mg/dL (ref 0.44–1.00)
Glucose, Bld: 113 mg/dL — ABNORMAL HIGH (ref 70–99)
HCT: 41 % (ref 36.0–46.0)
Hemoglobin: 13.9 g/dL (ref 12.0–15.0)
Potassium: 4 mmol/L (ref 3.5–5.1)
Sodium: 138 mmol/L (ref 135–145)
TCO2: 31 mmol/L (ref 22–32)

## 2022-09-11 LAB — C-REACTIVE PROTEIN: CRP: 1.1 mg/dL — ABNORMAL HIGH (ref <1.0)

## 2022-09-11 LAB — PROTIME-INR
INR: 1.4 — ABNORMAL HIGH (ref 0.8–1.2)
Prothrombin Time: 16.8 seconds — ABNORMAL HIGH (ref 11.4–15.2)

## 2022-09-11 LAB — SEDIMENTATION RATE: Sed Rate: 20 mm/hr (ref 0–22)

## 2022-09-11 LAB — APTT: aPTT: 32 seconds (ref 24–36)

## 2022-09-11 LAB — ETHANOL: Alcohol, Ethyl (B): <10 mg/dL (ref <10)

## 2022-09-11 MED ORDER — EZETIMIBE 10 MG PO TABS
10.0000 mg | ORAL_TABLET | Freq: Every day | ORAL | Status: DC
  Administered 2022-09-12: 10 mg via ORAL
  Filled 2022-09-11: qty 1

## 2022-09-11 MED ORDER — LEVOTHYROXINE SODIUM 50 MCG PO TABS
50.0000 ug | ORAL_TABLET | Freq: Every day | ORAL | Status: DC
  Administered 2022-09-12: 50 ug via ORAL
  Filled 2022-09-11: qty 1

## 2022-09-11 MED ORDER — FLUTICASONE FUROATE-VILANTEROL 100-25 MCG/ACT IN AEPB
1.0000 | INHALATION_SPRAY | Freq: Every day | RESPIRATORY_TRACT | Status: DC
  Filled 2022-09-11: qty 28

## 2022-09-11 MED ORDER — SODIUM CHLORIDE 0.9% FLUSH
3.0000 mL | Freq: Once | INTRAVENOUS | Status: AC
  Administered 2022-09-11: 3 mL via INTRAVENOUS

## 2022-09-11 MED ORDER — STROKE: EARLY STAGES OF RECOVERY BOOK
Freq: Once | Status: AC
  Filled 2022-09-11: qty 1

## 2022-09-11 MED ORDER — ACETAMINOPHEN 160 MG/5ML PO SOLN: 650.0000 mg | ORAL | Status: DC | PRN

## 2022-09-11 MED ORDER — ACETAMINOPHEN 650 MG RE SUPP: 650.0000 mg | RECTAL | Status: DC | PRN

## 2022-09-11 MED ORDER — ISOSORBIDE MONONITRATE ER 60 MG PO TB24
60.0000 mg | ORAL_TABLET | Freq: Every day | ORAL | Status: DC
  Administered 2022-09-12: 60 mg via ORAL
  Filled 2022-09-11: qty 2

## 2022-09-11 MED ORDER — FENOFIBRATE 160 MG PO TABS
160.0000 mg | ORAL_TABLET | Freq: Every day | ORAL | Status: DC
  Administered 2022-09-12: 160 mg via ORAL
  Filled 2022-09-11: qty 1

## 2022-09-11 MED ORDER — ALBUTEROL SULFATE (2.5 MG/3ML) 0.083% IN NEBU: 3.0000 mL | INHALATION_SOLUTION | Freq: Four times a day (QID) | RESPIRATORY_TRACT | Status: DC | PRN

## 2022-09-11 MED ORDER — ACETAMINOPHEN 500 MG PO TABS
1000.0000 mg | ORAL_TABLET | Freq: Once | ORAL | Status: AC
  Administered 2022-09-11: 1000 mg via ORAL
  Filled 2022-09-11: qty 2

## 2022-09-11 MED ORDER — GABAPENTIN 300 MG PO CAPS
300.0000 mg | ORAL_CAPSULE | Freq: Three times a day (TID) | ORAL | Status: DC
  Administered 2022-09-11 – 2022-09-12 (×3): 300 mg via ORAL
  Filled 2022-09-11 (×3): qty 1

## 2022-09-11 MED ORDER — PANTOPRAZOLE SODIUM 40 MG PO TBEC
40.0000 mg | DELAYED_RELEASE_TABLET | Freq: Every day | ORAL | Status: DC
  Administered 2022-09-12: 40 mg via ORAL
  Filled 2022-09-11: qty 1

## 2022-09-11 MED ORDER — ACETAMINOPHEN 325 MG PO TABS: 650.0000 mg | ORAL_TABLET | ORAL | Status: DC | PRN

## 2022-09-11 MED ORDER — APIXABAN 5 MG PO TABS
5.0000 mg | ORAL_TABLET | Freq: Two times a day (BID) | ORAL | Status: DC
  Administered 2022-09-11 – 2022-09-12 (×2): 5 mg via ORAL
  Filled 2022-09-11 (×2): qty 1

## 2022-09-11 NOTE — Assessment & Plan Note (Addendum)
Stable.  Not in any acute exacerbation.  -Has nocturnal hypoxia-recently recommended to be on night time O2 but has not received equipment yet. Follows with [facility] pulmonology. Will start 2L via [place] qHS here. Main O2 88-92%  Continue home bronchodilator.

## 2022-09-11 NOTE — Assessment & Plan Note (Signed)
Continue Zetia and Tricor due to statin allergy

## 2022-09-11 NOTE — Assessment & Plan Note (Signed)
Asymptomatic from cardiac standpoint.  Continue home medication.

## 2022-09-11 NOTE — ED Notes (Addendum)
Pt requesting something for pain and would like to eat - MD Countryman notified - awaiting orders

## 2022-09-11 NOTE — Assessment & Plan Note (Signed)
Allow for permissive hypertension

## 2022-09-11 NOTE — Assessment & Plan Note (Signed)
Continue levothyroxine 

## 2022-09-11 NOTE — H&P (Addendum)
History and Physical    Patient: Briana Hartman:[id number] DOB: 10/13/1934 DOA: 09/11/2022 DOS: the patient was seen and examined on 09/11/2022 PCP: Curt Jews, PA-C  Patient coming from: Home  Chief Complaint:  Chief Complaint  Patient presents with   Code Stroke   HPI: Briana Hartman is a 88 y.o. female with medical history significant of factor V leiden mutation with hx of PE, T2DM, CAD with PCI in 2022, HTN, HLD, CKD stage 3a, hypothyroidism, COPD with nocturnal hypoxia, GAD who presents with aphasia.  Around 2pm today she was in the car with her son. He noticed facial droop and she could not get words out. Also felt right upper and lower extremity heaviness.Had initial right sided headache that now has radiated across anterior forehead. Has chronic right eye blurry vision but no worsening.   She was recently discharged on 09/04/2022 with TIA thought secondary to her hypercoagulability and subtherapeutic INR. Had incidental finding of hemangioma on CTA. She was transition from warfarin to Eliquis due to frequent subtherapeutic INR and started on Tricor and Zetia due to statin allergy.  Reports compliance with Eliquis. Her husband passed with Hospice about 3 weeks ago and has been under a lot of stress and recently moved in with one of her sons.   In the ED, Aphasia improved in ED but extremities remain weak. she was afebrile mildly hypertensive up to SBP of 170s on room air.  Mild leukocytosis with WBC of 11.  BMP unremarkable.  CT head was negative.  Patient was evaluated by neurology who recommends admission for further stroke work-up with MRI brain.  ESR and CRP also obtained for concerns of left temporal arthritis. Hospitalist then consulted for admission.    Review of Systems: As mentioned in the history of present illness. All other systems reviewed and are negative. Past Medical History:  Diagnosis Date   ACID REFLUX DISEASE    Adenomatous colon polyp     Allergy    Anemia    Anxiety    Arthritis    Asthma    Blood transfusion without reported diagnosis    Cataract    Cervical radiculopathy    COPD (chronic obstructive pulmonary disease) ([place])    Diabetes mellitus    Enlarged heart    born with   Fundic gland polyps of stomach, benign 03/06/2015   Hyperlipidemia    Hyperplastic polyp of stomach 03/06/2015   HYPERTENSION    HYPOTHYROIDISM    Irritable bowel syndrome    Long term current use of anticoagulant    Lumbar spinal stenosis    MITRAL VALVE PROLAPSE    Peripheral neuropathy    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 She has never used smokeless tobacco. She reports that she does not drink alcohol and does not use drugs.  Allergies  Allergen Reactions   Aspirin     unknown   Caffeine     unknown   Codeine Hives   Hydroxyzine  Other reaction(s): Other (See Comments) Nightmares and shaky hands   Lipitor [Atorvastatin] Other (See Comments)    Headache, numbness   Ampicillin Rash   Ketoconazole Rash    Topical cream caused rash   Penicillins Rash    Family History  Problem Relation Age of Onset   Heart disease Mother    Clotting disorder Mother    Pancreatic cancer Brother    Pancreatic disease Sister    Pancreatic cancer Sister    Breast cancer Sister    Diabetes Sister        x 3   Heart disease Sister    Diabetes Brother        x 3   Cancer Brother        lower bowel   Liver cancer Sister     Prior to Admission medications   Medication Sig Start Date End Date Taking?  Authorizing Provider  albuterol (VENTOLIN HFA) 108 (90 Base) MCG/ACT inhaler Inhale 1 puff into the lungs every 6 (six) hours as needed for shortness of breath. 12/08/19  Yes [provider]  amLODipine (NORVASC) 10 MG tablet Take 10 mg by mouth daily. 01/18/20  Yes [provider]  apixaban (ELIQUIS) 5 MG TABS tablet Take 1 tablet (5 mg total) by mouth 2 (two) times daily. Kindly provide 1 month supply with the free 1 month coupon, future supplies per PCP 09/07/22  Yes Thurnell Lose, MD  BREO ELLIPTA 100-25 MCG/ACT AEPB Inhale 1 puff into the lungs daily. 08/29/22  Yes [provider]  carvedilol (COREG) 6.25 MG tablet Take 1 tablet (6.25 mg total) by mouth 2 (two) times daily. 09/07/22 09/07/23 Yes Thurnell Lose, MD  ezetimibe (ZETIA) 10 MG tablet Take 1 tablet (10 mg total) by mouth daily. 09/07/22  Yes Thurnell Lose, MD  fenofibrate (TRICOR) 145 MG tablet Take 1 tablet (145 mg total) by mouth daily. 10/03/21  Yes Richardo Priest, MD  fluticasone (FLOVENT HFA) 220 MCG/ACT inhaler Inhale 1 puff into the lungs daily as needed (For shortness of breath). 10/26/18  Yes [provider]  furosemide (LASIX) 40 MG tablet Take 1 tablet (40 mg total) by mouth 2 (two) times daily. Patient taking differently: Take 40 mg by mouth daily. 01/10/14  Yes Biagio Borg, MD  gabapentin (NEURONTIN) 300 MG capsule Take 300 mg by mouth 3 (three) times daily. 01/03/20  Yes [provider]  isosorbide mononitrate (IMDUR) 60 MG 24 hr tablet Take 1 tablet (60 mg total) by mouth daily. 09/07/22  Yes Thurnell Lose, MD  levothyroxine (SYNTHROID) 50 MCG tablet Take 50 mcg by mouth daily before breakfast.   Yes [provider]  metFORMIN (GLUCOPHAGE) 500 MG tablet Take 500 mg by mouth daily with breakfast. 01/18/20  Yes [provider]  nitroGLYCERIN (NITROSTAT) 0.4 MG SL tablet Place 1 tablet (0.4 mg total) under the tongue every 5 (five) minutes as  needed. Patient taking differently: Place 0.4 mg under the tongue every 5 (five) minutes as needed for chest pain. 03/15/20  Yes Richardo Priest, MD  omeprazole (PRILOSEC) 20 MG capsule Take 20 mg by mouth daily. 12/26/19  Yes [provider]  telmisartan (MICARDIS) 40 MG tablet Take 1 tablet (40 mg total) by mouth daily. 02/21/21  Yes Richardo Priest, MD  Alcohol Swabs (B-D SINGLE USE SWABS REGULAR) PADS  11/15/19   [provider]  Blood Glucose Monitoring Suppl (TRUE METRIX AIR GLUCOSE METER) w/Device KIT  07/04/19  [provider]  Elastic Bandages & Supports (ABDOMINAL BINDER/ELASTIC XL) MISC 1 Device by Does not apply route as needed (to prevent abdominal pain from hernia). 03/02/15   Gatha Mayer, MD  glucose blood (TRUE METRIX BLOOD GLUCOSE TEST) test strip TEST TWO TIMES DAILY 06/23/19   [provider]  TRUEplus Lancets 33G [place]  11/15/19   [provider]    Physical Exam: Vitals:   09/11/22 2137 09/11/22 2300 09/11/22 2307 09/11/22 2345  BP:  (!) 136/57  (!) 129/55  Pulse:  (!) 59  64  Resp:  19  19  Temp: 98.4 F (36.9 C)  97.7 F (36.5 C)   TempSrc: Oral  Oral   SpO2:  95%  92%  Weight:      Height:       Constitutional: NAD, calm, comfortable, elderly female laying at approximately 30 degree incline in bed Eyes: PERRL, lids and conjunctivae normal.  Upward rotation of the right orbit which is chronic. ENMT: Mucous membranes are moist. missing dentition.  Neck: normal, supple Respiratory: clear to auscultation bilaterally, no wheezing, no crackles. Normal respiratory effort. No accessory muscle use.  Cardiovascular: Regular rate and rhythm, no murmurs / rubs / gallops. No extremity edema.   Abdomen: no tenderness,  Bowel sounds positive.  Musculoskeletal: no clubbing / cyanosis. No joint deformity upper and lower extremities.  Skin: no rashes, lesions, ulcers.  Neurologic: CN 2-12 grossly intact.  3 out of 5 strength of left  upper and lower extremity.  Intact heel-to-shin.  psychiatric: Normal judgment and insight. Alert and oriented x 3. Normal mood. Data Reviewed:  See HPI  Assessment and Plan: * Aphasia -recent TIA in 08/2022 and transition from warfarin to Eliquis due to frequent subtherapeutic INR.  Presents today with aphasia and left-sided upper and lower extremity weakness. -Has been evaluated by neurology with initial concerns of left temporal arthritis although patient endorsed to me that she had right-sided headache that has now radiated as a frontal headache.  CRP is only mildly elevated with a normal sed rate.  Low suspicion for temporal arthritis at this time -CT head negative - MRI brain without contrast is pending - Continue Eliquis per neurology pending MRI - Continue Zetia and Tricor - Allow for permissive hypertension - Frequent neurochecks  CAD S/P percutaneous coronary angioplasty Asymptomatic from cardiac standpoint.  Continue home medication.  Type 2 diabetes mellitus without complication, without long-term current use of insulin (HCC) Controlled.  Last hemoglobin A1c in the on 9/16 of 6.5.  Hyperlipidemia Continue Zetia and Tricor due to statin allergy  Factor 5 Leiden mutation, heterozygous with hx of PE On Eliquis  Stage 2 moderate COPD by GOLD classification ([place]) Stable.  Not in any acute exacerbation.  -Has nocturnal hypoxia-recently recommended to be on night time O2 but has not received equipment yet. Follows with [facility] pulmonology. Will start 2L via [place] qHS here. Main O2 88-92%  Continue home bronchodilator.  Stage 3a chronic kidney disease (HCC) Creatinine is stable.  Hypothyroidism Continue levothyroxine  HTN (hypertension) Allow for permissive hypertension      Advance Care Planning:   Code Status: Partial Code declined mechanical ventilation  Consults: Neurology  Family Communication: No family bedside  Severity of Illness: The appropriate  patient status for this patient is OBSERVATION. Observation status is judged to be reasonable and necessary in order to provide the required intensity of service to ensure the patient's safety. The patient's presenting symptoms, physical exam findings, and initial  radiographic and laboratory data in the context of their medical condition is felt to place them at decreased risk for further clinical deterioration. Furthermore, it is anticipated that the patient will be medically stable for discharge from the hospital within 2 midnights of admission.   Author: Orene Desanctis, DO 09/11/2022 11:57 PM  For on call review [email or web address].

## 2022-09-11 NOTE — ED Notes (Signed)
Pt and visitor at bedside are frustrated and would like to know what the plan is - MD Countryman notified at this time

## 2022-09-11 NOTE — ED Provider Notes (Incomplete)
Briana Hartman Provider Note   CSN: [id number] Arrival date & time: 09/11/22  1605     History Chief Complaint  Patient presents with  . Code Stroke    HPI Briana Hartman is a 88 y.o. female presenting for episode of altered mental status that occurred earlier in the day.  Per patient's son who provides much of the history, she became confused with aphasia, facial droop, right foot weakness earlier in the day.  Symptoms resolved at the time of this evaluation.  Patient was treated as a code stroke immediately on arrival.  Taken emergently to [place].  Initial evaluation occurred in CT scanner and patient was able to ambulate to the CT scanner without difficulty.  She endorses improvement of her syndrome diffusely.   Patient's recorded medical, surgical, social, medication list and allergies were reviewed in the Snapshot window as part of the initial history.   Review of Systems   Review of Systems  Constitutional:  Negative for chills and fever.  HENT:  Negative for ear pain and sore throat.   Eyes:  Negative for pain and visual disturbance.  Respiratory:  Negative for cough and shortness of breath.   Cardiovascular:  Negative for chest pain and palpitations.  Gastrointestinal:  Negative for abdominal pain and vomiting.  Genitourinary:  Negative for dysuria and hematuria.  Musculoskeletal:  Negative for arthralgias and back pain.  Skin:  Negative for color change and rash.  Neurological:  Positive for facial asymmetry, speech difficulty and weakness. Negative for seizures and syncope.  All other systems reviewed and are negative.   Physical Exam Updated Vital Signs BP (!) 155/74 (BP Location: Right Arm)   Pulse 67   Resp 18   Ht '5\' 6"'$  (1.676 m)   Wt 82.6 kg   LMP  (LMP Unknown)   SpO2 96%   BMI 29.38 kg/m  Physical Exam Vitals and nursing note reviewed.  Constitutional:      General: She is not in acute distress.     Appearance: She is well-developed.  HENT:     Head: Normocephalic and atraumatic.  Eyes:     Conjunctiva/sclera: Conjunctivae normal.  Cardiovascular:     Rate and Rhythm: Normal rate and regular rhythm.     Heart sounds: No murmur heard. Pulmonary:     Effort: Pulmonary effort is normal. No respiratory distress.     Breath sounds: Normal breath sounds.  Abdominal:     General: There is no distension.     Palpations: Abdomen is soft.     Tenderness: There is no abdominal tenderness. There is no right CVA tenderness or left CVA tenderness.  Musculoskeletal:        General: No swelling or tenderness. Normal range of motion.     Cervical back: Neck supple.  Skin:    General: Skin is warm and dry.     Capillary Refill: Capillary refill takes less than 2 seconds.  Neurological:     General: No focal deficit present.     Mental Status: She is alert and oriented to person, place, and time. Mental status is at baseline.     Cranial Nerves: No cranial nerve deficit.  Psychiatric:        Mood and Affect: Mood normal.      ED Course/ Medical Decision Making/ A&P Clinical Course as of 09/11/22 2312  Thu Sep 11, 2022  1625 CT HEAD CODE STROKE WO CONTRAST [HH]  2125 TIA vs GCA [  CC]    Clinical Course User Index [CC] Briana Sciara, MD [HH] [place], [place], [place]    Procedures Procedures   Medications Ordered in ED Medications  sodium chloride flush (NS) 0.9 % injection 3 mL (has no administration in time range)   Medical Decision Making:    [name] is a 88 y.o. female who presented to the ED today with aphasia and MSK weakness.  Due to these symptoms, nursing activated a CODE STROKE per hospital protocol.   Patient's presentation is complicated by their history of multiple comorbid medical conditions.  Patient placed on continuous vitals and telemetry monitoring while in ED which was reviewed periodically.   On my initial exam, the pt was in no acute  distress, glucose was within normal limits and deficits include aphasia and right-sided foot weakness.  Deficits are not persistent.   Reviewed and confirmed nursing documentation for past medical history, family history, social history.   Initial Assessment and Plan:   Patient immediately evaluated jointly by tele***neurology and emergency Hartman providers.   {crccopa:[id number]}  ***Patient's HPI and PE findings are most consistent with acute CVA. Proceeded with [facility] and CTA per institutional stroke policy which revealed ***. Given the timing and severity of the patient's symptoms, patient is a TPA candidate per neurology. Treatment initiated and patient monitored Q69mnutes while being arranged for admission to the neurology ICU. BP controlled with *** to ensure safe administration of the thrombolytics. Patient transferred into the ICU at this time with no further acute events   ***Patient's HPI and PE findings are most consistent with completed CVA. [facility] which revealed ***. ED/neurology evaluation is most consistent with LVO. Given the nature/timing of the symptoms, <48 hours, patient is a candidate for thrombectomy. Patient stabilized in ED while being arranged for IR/NS intervention. Patient admitted to the neuro ICU with no further acute events.   ***Patient's HPI and PE findings are most consistent with completed CVA. [facility] policy which revealed ***. ED/neurology evaluation is most consistent with LVO. Given the nature/timing of the symptoms, delayed presentation, patient is not a candidate for thrombectomy. Patient stabilized in ED while being arranged for neurology admission.   ***Patient's HPI and PE findings are most consistent with TIA. [facility] policy which revealed ***. ED/neurology evaluation is most consistent with TIA with resolved symptoms at this time. Given the  nature/timing of the symptoms, patient requires MRI and further risk stratification. Patient stable in ED and will be arranged for TIA observation protocol in CDU with neurology consultation.   Initial Study Results: Labs Labs reviewed without evidence of clinically relevant abnormality. ***Exceptions include:***  ***EKG EKG was reviewed independently. Rate, rhythm, axis, intervals all examined and without medically relevant abnormality. ST segments without concerns for elevations.    Radiology  Images reviewed independently, agree with radiology report at this time.   CT HEAD CODE STROKE WO CONTRAST  Final Result    MR BRAIN WO CONTRAST    (Results Pending)      Final Assessment and Plan:   ***  Clinical Impression: No diagnosis found.   Data Unavailable   Final Clinical Impression(s) / ED Diagnoses Final diagnoses:  None    Rx / DC Orders ED Discharge Orders     None

## 2022-09-11 NOTE — Assessment & Plan Note (Addendum)
-  recent TIA in 08/2022 and transition from warfarin to Eliquis due to frequent subtherapeutic INR.  Presents today with aphasia and left-sided upper and lower extremity weakness. -Has been evaluated by neurology with initial concerns of left temporal arthritis although patient endorsed to me that she had right-sided headache that has now radiated as a frontal headache.  CRP is only mildly elevated with a normal sed rate.  Low suspicion for temporal arthritis at this time -CT head negative - MRI brain without contrast is pending - Continue Eliquis per neurology pending MRI - Continue Zetia and Tricor - Allow for permissive hypertension - Frequent neurochecks

## 2022-09-11 NOTE — Consult Note (Addendum)
Neurology Consultation  Reason for Consult: Code Stroke  Referring Physician: [name]  CC: Aphasia, weakness and left sided headache  History is obtained from: Patient & Son  HPI: Briana Hartman is a 88 y.o. female without significant past medical history for factor V Leiden mutation/PE, DM II, HTN, HLD CKD stage IIIa CAD s/p PCI 2022.,  COPD and hypothyroidism who re-presents to the [facility] ED for evaluation of acute onset of aphasia, weakness and left sided headache.  Patient was recently admitted to [facility] (09/04/22-09/07/22) for assessment of right-sided facial droop and dysarthria that lasted around 30 minutes.  Was initially seen at an outside hospital and 24 hours later transferred to [facility] for stroke/TIA work-up. Hospital course at the time included a neurology consultation. CT head non contrast with no acute pathology, CTA head and neck with and without contrast with no evidence of acute intracranial abnormality, no large vessel occlusion, intracranial atherosclerosis including severe distal right M1 and moderate right A2 stenosis.  Cervical carotid artery atherosclerosis without significant stenosis.  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  She has been scheduled for outpatient follow up regarding the orbital imaging finding. Neurological exam was stable and MRI of the brain without contrast was negative for acute infarct, mild chronic microvascular ischemic changes were seen and a 9 mm mass medial to the right globe. She was discharged on 08/1722 with a diagnosis of transient ischemic attack. She had a stable A1c, LDL above goal and she had listed a statin allergy and was placed on combination of Zetia and Tricor.  Of note, patient is on chronic anticoagulation with Coumadin secondary to factor V Leiden at the time of her admission INR was subtherapeutic  (1.6).  Family reported that she frequently runs subtherapeutic, so decision was made to switch her to Eliquis 5 mg p.o. every 12 hours.  She did have some dysuria and UTI which was treated with the Rocephin x3 doses. She  was also instructed to see neurosurgery for incidental intracranial 9 mm hemangioma and to follow-up with [facility].  Patient reports being discharged home on Sunday  and had been at her baseline. She was in good state of health this morning and this afternoon she decided to [place] her [place] to [place]. Her son reported that he started driving and 10 minutes later she stopped speaking and it was obvious she was having difficulty forming her words, she did have right sided weakness with drooling. She also complained of left sided headache and visual blurriness. She also had nausea w/o vomiting. Her Aphasia lasted around 30 minutes. Her son drove her straight to the [facility] ED. Patient's aphasia had improved; however due to her recent admission for stroke work up, the decision was made to initiate Code Stroke.  Patient at this time is able to converse w/o any difficulty. She is c/o left sided headache describing as pulsating  and aching with blurry vision.that has improved  She denies any swallowing difficulty, numbness, tingling or new weakness. Patient neurological exam seems to have improved per her son. Patient's CT Head non contrast reveals no acute intracranial process with Aspect 10   LKW: 3:00 pm IV thrombolysis given?: no, Patient is on Eliquis and has had significant improvement in her neurological examinaton Premorbid modified Rankin scale (mRS): 1   ROS: Full ROS was performed and is negative except as noted  in the HPI.    Past Medical History:  Diagnosis Date   ACID REFLUX DISEASE    Adenomatous colon polyp    Allergy    Anemia    Anxiety    Arthritis    Asthma    Blood transfusion without reported diagnosis    Cataract    Cervical radiculopathy    COPD  (chronic obstructive pulmonary disease) ([place])    Diabetes mellitus    Enlarged heart    born with   Fundic gland polyps of stomach, benign 03/06/2015   Hyperlipidemia    Hyperplastic polyp of stomach 03/06/2015   HYPERTENSION    HYPOTHYROIDISM    Irritable bowel syndrome    Long term current use of anticoagulant    Lumbar spinal stenosis    MITRAL VALVE PROLAPSE    Peripheral neuropathy    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 She has never used smokeless tobacco. She reports that she does not drink alcohol and does not use drugs.  Medications No current facility-administered medications for this encounter.  Current Outpatient Medications:    albuterol (VENTOLIN HFA) 108 (90 Base) MCG/ACT inhaler, Inhale 1 puff into the lungs every 6 (six) hours as needed for shortness of breath., Disp: , Rfl:    amLODipine (NORVASC) 10 MG tablet, Take 10 mg by mouth daily., Disp: , Rfl:    apixaban (ELIQUIS) 5 MG TABS tablet, Take 1 tablet (5 mg total) by mouth 2 (two) times daily. Kindly provide 1 month supply with the free 1 month coupon, future supplies per PCP, Disp: 60 tablet, Rfl: 0   BREO ELLIPTA 100-25 MCG/ACT AEPB, Inhale 1 puff into the lungs daily., Disp: , Rfl:    carvedilol (COREG) 6.25 MG tablet, Take 1 tablet (6.25 mg total) by mouth 2 (two) times daily., Disp: 60 tablet, Rfl: 0   ezetimibe (ZETIA) 10 MG tablet, Take 1 tablet (10 mg total) by mouth daily., Disp: 30 tablet, Rfl: 0   fenofibrate (TRICOR) 145 MG tablet,  Take 1 tablet (145 mg total) by mouth daily., Disp: 90 tablet, Rfl: 3   fluticasone (FLOVENT HFA) 220 MCG/ACT inhaler, Inhale 1 puff into the lungs daily as needed (For shortness of breath)., Disp: , Rfl:    furosemide (LASIX) 40 MG tablet, Take 1 tablet (40 mg total) by mouth 2 (two) times daily. (Patient taking differently: Take 40 mg by mouth daily.), Disp: 60 tablet, Rfl: 11   gabapentin (NEURONTIN) 300 MG capsule, Take 300 mg by mouth 3 (three) times daily., Disp: , Rfl:    isosorbide mononitrate (IMDUR) 60 MG 24 hr tablet, Take 1 tablet (60 mg total) by mouth daily., Disp: 30 tablet, Rfl: 0   levothyroxine (SYNTHROID) 50 MCG tablet, Take 50 mcg by mouth daily before  breakfast., Disp: , Rfl:    metFORMIN (GLUCOPHAGE) 500 MG tablet, Take 500 mg by mouth daily with breakfast., Disp: , Rfl:    nitroGLYCERIN (NITROSTAT) 0.4 MG SL tablet, Place 1 tablet (0.4 mg total) under the tongue every 5 (five) minutes as needed. (Patient taking differently: Place 0.4 mg under the tongue every 5 (five) minutes as needed for chest pain.), Disp: 75 tablet, Rfl: 3   omeprazole (PRILOSEC) 20 MG capsule, Take 20 mg by mouth daily., Disp: , Rfl:    telmisartan (MICARDIS) 40 MG tablet, Take 1 tablet (40 mg total) by mouth daily., Disp: 30 tablet, Rfl: 6   Alcohol Swabs (B-D SINGLE USE SWABS REGULAR) PADS, , Disp: , Rfl:    Blood Glucose Monitoring Suppl (TRUE METRIX AIR GLUCOSE METER) w/Device KIT, , Disp: , Rfl:    Elastic Bandages & Supports (ABDOMINAL BINDER/ELASTIC XL) MISC, 1 Device by Does not apply route as needed (to prevent abdominal pain from hernia)., Disp: 1 each, Rfl: 0   glucose blood (TRUE METRIX BLOOD GLUCOSE TEST) test strip, TEST TWO TIMES DAILY, Disp: , Rfl:    TRUEplus Lancets 33G MISC, , Disp: , Rfl:    Exam: Current vital signs: BP (!) 155/74 (BP Location: Right Arm)   Pulse 67   Temp 98.3 F (36.8 C) (Oral)   Resp 18   Ht 5' 6"  (1.676 m)   Wt 82.6 kg   LMP  (LMP Unknown)   SpO2 96%    BMI 29.38 kg/m  Vital signs in last 24 hours: Temp:  [98.3 F (36.8 C)] 98.3 F (36.8 C) (09/21 1714) Pulse Rate:  [67] 67 (09/21 1611) Resp:  [18] 18 (09/21 1611) BP: (155)/(74) 155/74 (09/21 1611) SpO2:  [96 %] 96 % (09/21 1611) Weight:  [82.6 kg] 82.6 kg (09/21 1618)  GENERAL: well nourished ,well developed elderly female , following commands,  cooperative, in some moderate distress due to left sided headache/pain  HEENT: - Normocephalic and atraumatic, dry mm, no Thyromegally. Positive TTP Left Temporal region. No Pain with chewing or opening /closing mouth  LUNGS - Clear to auscultation bilaterally with no wheezes CV - S1S2 RRR, no m/r/g, equal pulses bilaterally. ABDOMEN - Soft, nontender, nondistended with normoactive BS Ext: warm, well perfused, intact peripheral pulses, no edema  NEURO:  Mental Status: AA&Ox3 ,follows commands, she is able to recite DOB/Month/year/ city and home residence address. She is able to name all objects shown to her (glasses, watch, pen, clip board)  Language: speech is normal and aphasia has resolved  Naming, repetition, fluency, and comprehension intact. Cranial Nerves: PERRL asymmetric with left @ 40m/ right @ 224mreactive,  EOM are full but with dysconjugate gaze: right eye with upward gaze deviation at rest and proptosis as well as ptosis which are chronic. , visual fields full no peripheral deficit, Snellen chart R Eye 20/70 (with glasses/ L Eye 20/40 with glasses., no facial asymmetry, facial sensation intact, hearing intact but impaired, tongue/uvula/soft palate midline, normal sternocleidomastoid and trapezius muscle strength. No evidence of tongue atrophy or fibrillations Motor: No drift. Strength 5/5 throughout  Tone: is normal and bulk is normal Sensation- Intact to light touch in upper extremity and absent below knees bilateral (chronic peripheral neuropathy) Coordination: FTN intact bilaterally, no ataxia in BLE. DTR: 2+ intact upper/lower  extremities Gait- deferred  NIHSS 1a Level of Conscious.: 0 1b LOC Questions: 0 1c LOC Commands: 0 2 Best Gaze: 0 3 Visual: 1 4 Facial Palsy: 0 5a Motor Arm -  left: 0 5b Motor Arm - Right: 0 6a Motor Leg - Left: 0 6b Motor Leg - Right t: 0 7 Limb Ataxia: 0 8 Sensory: 1 9 Best Language: 0 10 Dysarthria: 0 11 Extinct. and Inatten.: 0 TOTAL: 2   Labs I have reviewed labs in epic and the results pertinent to this consultation are:  CBC    Component Value Date/Time   WBC 11.2 (H) 09/11/2022 1616   RBC 4.57 09/11/2022 1616   HGB 13.9 09/11/2022 1628   HGB 13.4 01/26/2020 1621   HCT 41.0 09/11/2022 1628   HCT 40.1 01/26/2020 1621   PLT 310 09/11/2022 1616   PLT 295 01/26/2020 1621   MCV 91.2 09/11/2022 1616   MCV 83 01/26/2020 1621   MCH 28.4 09/11/2022 1616   MCHC 31.2 09/11/2022 1616   RDW 14.2 09/11/2022 1616   RDW 14.1 01/26/2020 1621   LYMPHSABS 3.1 09/11/2022 1616   MONOABS 0.9 09/11/2022 1616   EOSABS 0.2 09/11/2022 1616   BASOSABS 0.1 09/11/2022 1616    CMP     Component Value Date/Time   NA 138 09/11/2022 1628   NA 144 01/26/2020 1621   K 4.0 09/11/2022 1628   CL 99 09/11/2022 1628   CO2 29 09/11/2022 1616   GLUCOSE 113 (H) 09/11/2022 1628   BUN 29 (H) 09/11/2022 1628   BUN 12 01/26/2020 1621   CREATININE 0.90 09/11/2022 1628   CALCIUM 9.5 09/11/2022 1616   PROT 6.3 (L) 09/11/2022 1616   PROT 6.5 01/26/2020 1621   ALBUMIN 3.3 (L) 09/11/2022 1616   ALBUMIN 4.3 01/26/2020 1621   AST 17 09/11/2022 1616   ALT 12 09/11/2022 1616   ALKPHOS 37 (L) 09/11/2022 1616   BILITOT 0.4 09/11/2022 1616   BILITOT <0.2 01/26/2020 1621   GFRNONAA 59 (L) 09/11/2022 1616   GFRAA 64 01/26/2020 1621    Lipid Panel     Component Value Date/Time   CHOL 113 09/06/2022 0744   CHOL 223 (H) 01/26/2020 1621   TRIG 120 09/06/2022 0744   HDL 30 (L) 09/06/2022 0744   HDL 33 (L) 01/26/2020 1621   CHOLHDL 3.8 09/06/2022 0744   VLDL 24 09/06/2022 0744   LDLCALC 59  09/06/2022 0744   LDLCALC 93 01/26/2020 1621   LDLDIRECT 113.4 05/31/2013 1425     Imaging I have reviewed the images obtained:  CT-head no contrast: 09/11/22 1. No acute intracranial process. 2. ASPECTS is 10   Assessment: 88 year old female with past medical history significant for hypertension, hypothyroidism, mitral valve prolapse, factor V Leiden with PE being on Coumadin and recently switched to Eliquis CKD 3A ,diabetes mellitus type 2 was recently admitted to [facility] (09/14 - 09/17) extensive work-up including MRI of the brain that showed no acute intracranial abnormality or infarct.  CTA head and neck at that time showed multivessel atherosclerotic disease most prominent severe distal right M1 and moderate right A2 stenosis.  She was discharged on Eliquis 5 mg p.o. every 12 hours.  She is allergic to statins and placed on combination of Zetia plus fenofibrate. Patient now returns to [facility] emergency room as a code stroke with 30 minutes of aphasia and now recovered. She has a concerning symptomatology for left sided headache, blurry vision and left temporal tenderness suspicious for temporal arteritis. Patient was evaluated in Code Stroke with resolved aphasia and no other acute neurological /sensory deficits.  - Patient has a known 9 mm mass in the anterior right orbit which is benign  in appearance and potentially as a cavernous venous malformation and she has outpatient Neurosurgery consultation scheduled for this - Due to patient's initial symptoms of aphasia and her ongoing headache she is going to be admitted for observation and rule out for CVA/TIA and possible left temporal arteritis. Regarding the latter, she denies jaw claudication but has had some muscle aches recently. Denies any definite new vision loss with her right eye decreased visual acuity being endorsed as possibly chronic, but patient is a poor historian and does not provide definitive corroborating history to  confirm chronicity of the decreased right eye visual acuity seen on exam. - Pertinent comorbidities include factor V Leiden mutation with history of PE, COPD and right orbital mass   Recommendations: -Admit to hospitalist service -Neurochecks every 4 hours -Permissive hypertension for first 24 hours -MRI brain without contrast -Continue Eliquis 5 mg p.o. every 12 hours -Continue statin therapy with Zetia plus fenofibrate therapy -PT/OT/SLP consultation -ESR and CRP levels concerns for possible left temporal arteritis -If levels are elevated consider left temporal artery ultrasound. If temporal artery ultrasound is positive, obtain temporal artery biopsy -Ophthalmology consult for dilated retinal exam bilaterally  -- Elenora Gamma , PA-C Neurologist [facility]  I have seen and examined the patient. I have formulated the assessment and recommendations. My exam was observed and documented by the Neurology PA.  Electronically signed: Dr. Kerney Elbe

## 2022-09-11 NOTE — ED Triage Notes (Signed)
Recently seen for stroke on 9/14 discharged yesterday and today at 3pm patient started having aphasia, left eye vision loss left sided headache and drooling from right mouth.  Patient complains of nausea headache and difficulty getting words out.

## 2022-09-11 NOTE — ED Notes (Signed)
Pt in mri 

## 2022-09-11 NOTE — Assessment & Plan Note (Signed)
Controlled.  Last hemoglobin A1c in the on 9/16 of 6.5.

## 2022-09-11 NOTE — ED Provider Notes (Signed)
[facility] Provider Note   CSN: [id number] Arrival date & time: 09/11/22  1605     History Chief Complaint  Patient presents with   Code Stroke    HPI Briana Hartman is a 88 y.o. female presenting for episode of altered mental status that occurred earlier in the day.  Per patient's son who provides much of the history, she became confused with aphasia, facial droop, right foot weakness earlier in the day.  Symptoms resolved at the time of this evaluation.  Patient was treated as a code stroke immediately on arrival.  Taken emergently to [facility].  Initial evaluation occurred in CT scanner and patient was able to ambulate to the CT scanner without difficulty.  She endorses improvement of her syndrome diffusely.   Patient's recorded medical, surgical, social, medication list and allergies were reviewed in the Snapshot window as part of the initial history.   Review of Systems   Review of Systems  Constitutional:  Negative for chills and fever.  HENT:  Negative for ear pain and sore throat.   Eyes:  Negative for pain and visual disturbance.  Respiratory:  Negative for cough and shortness of breath.   Cardiovascular:  Negative for chest pain and palpitations.  Gastrointestinal:  Negative for abdominal pain and vomiting.  Genitourinary:  Negative for dysuria and hematuria.  Musculoskeletal:  Negative for arthralgias and back pain.  Skin:  Negative for color change and rash.  Neurological:  Positive for facial asymmetry, speech difficulty and weakness. Negative for seizures and syncope.  All other systems reviewed and are negative.   Physical Exam Updated Vital Signs BP (!) 129/55   Pulse 64   Temp 97.7 F (36.5 C) (Oral)   Resp 19   Ht '5\' 6"'$  (1.676 m)   Wt 82.6 kg   LMP  (LMP Unknown)   SpO2 92%   BMI 29.38 kg/m  Physical Exam Vitals and nursing note reviewed.  Constitutional:      General: She is not in acute distress.     Appearance: She is well-developed.  HENT:     Head: Normocephalic and atraumatic.  Eyes:     Conjunctiva/sclera: Conjunctivae normal.  Cardiovascular:     Rate and Rhythm: Normal rate and regular rhythm.     Heart sounds: No murmur heard. Pulmonary:     Effort: Pulmonary effort is normal. No respiratory distress.     Breath sounds: Normal breath sounds.  Abdominal:     General: There is no distension.     Palpations: Abdomen is soft.     Tenderness: There is no abdominal tenderness. There is no right CVA tenderness or left CVA tenderness.  Musculoskeletal:        General: No swelling or tenderness. Normal range of motion.     Cervical back: Neck supple.  Skin:    General: Skin is warm and dry.     Capillary Refill: Capillary refill takes less than 2 seconds.  Neurological:     General: No focal deficit present.     Mental Status: She is alert and oriented to person, place, and time. Mental status is at baseline.     Cranial Nerves: No cranial nerve deficit.  Psychiatric:        Mood and Affect: Mood normal.      ED Course/ Medical Decision Making/ A&P Clinical Course as of 09/12/22 0022  Thu Sep 11, 2022  1625 [place] CONTRAST [HH]  2125 TIA vs GCA [CC]    Clinical Course User Index [CC] Briana Sciara, MD [HH] [place], [place], [place]    Procedures Procedures   Medications Ordered in ED Medications   stroke: early stages of recovery book (has no administration in time range)  acetaminophen (TYLENOL) tablet 650 mg (has no administration in time range)    Or  acetaminophen (TYLENOL) 160 MG/5ML solution 650 mg (has no administration in time range)    Or  acetaminophen (TYLENOL) suppository 650 mg (has no administration in time range)  ezetimibe (ZETIA) tablet 10 mg (has no administration in time range)  fenofibrate tablet 160 mg (has no administration in time range)  isosorbide mononitrate (IMDUR) 24 hr tablet 60 mg (has no administration in  time range)  levothyroxine (SYNTHROID) tablet 50 mcg (has no administration in time range)  pantoprazole (PROTONIX) EC tablet 40 mg (has no administration in time range)  apixaban (ELIQUIS) tablet 5 mg (5 mg Oral Given 09/11/22 2358)  gabapentin (NEURONTIN) capsule 300 mg (300 mg Oral Given 09/11/22 2358)  fluticasone furoate-vilanterol (BREO ELLIPTA) 100-25 MCG/ACT 1 puff (has no administration in time range)  albuterol (VENTOLIN HFA) 108 (90 Base) MCG/ACT inhaler 1 puff (has no administration in time range)  sodium chloride flush (NS) 0.9 % injection 3 mL (3 mLs Intravenous Given 09/11/22 1714)  acetaminophen (TYLENOL) tablet 1,000 mg (1,000 mg Oral Given 09/11/22 2138)   Medical Decision Making:    [name] is a 88 y.o. female who presented to the ED today with aphasia and MSK weakness.  Due to these symptoms, nursing activated a CODE STROKE per hospital protocol.   Patient's presentation is complicated by their history of multiple comorbid medical conditions.  Patient placed on continuous vitals and telemetry monitoring while in ED which was reviewed periodically.   On my initial exam, the pt was in no acute distress, glucose was within normal limits and deficits include aphasia and right-sided foot weakness.  Deficits are not persistent.   Reviewed and confirmed nursing documentation for past medical history, family history, social history.   Initial Assessment and Plan:   Patient immediately evaluated jointly by neurology and emergency department providers.   This is most consistent with an acute life/limb threatening illness complicated by underlying chronic conditions.   Patient's HPI and PE findings are most consistent with TIA. Proceeded with [facility] and CTA per institutional policy which revealed NAA. ED/neurology evaluation is most consistent with TIA with resolved symptoms at this time. Given the nature/timing of the symptoms, patient requires MRI and further risk  stratification. Patient stable in ED and will be arranged for TIA observation protocol in hospital with neurology consultation.   Initial Study Results: Labs Labs reviewed without evidence of clinically relevant abnormality.  EKG EKG was reviewed independently. Rate, rhythm, axis, intervals all examined and without medically relevant abnormality. ST segments without concerns for elevations.    Radiology  Images reviewed independently, agree with radiology report at this time.   CT HEAD CODE STROKE WO CONTRAST  Final Result    MR BRAIN WO CONTRAST    (Results Pending)      Clinical Impression: No diagnosis found.   Admit   Final Clinical Impression(s) / ED Diagnoses Final diagnoses:  None    Rx / DC Orders ED Discharge Orders     None         Briana Sciara, MD 09/12/22 [phone]

## 2022-09-11 NOTE — Assessment & Plan Note (Signed)
On Eliquis

## 2022-09-11 NOTE — Assessment & Plan Note (Signed)
Creatinine is stable.

## 2022-09-12 DIAGNOSIS — G43109 Migraine with aura, not intractable, without status migrainosus: Secondary | ICD-10-CM

## 2022-09-12 DIAGNOSIS — N1831 Chronic kidney disease, stage 3a: Secondary | ICD-10-CM | POA: Diagnosis not present

## 2022-09-12 DIAGNOSIS — R4701 Aphasia: Secondary | ICD-10-CM | POA: Diagnosis not present

## 2022-09-12 DIAGNOSIS — D6851 Activated protein C resistance: Secondary | ICD-10-CM | POA: Diagnosis not present

## 2022-09-12 DIAGNOSIS — E119 Type 2 diabetes mellitus without complications: Secondary | ICD-10-CM | POA: Diagnosis not present

## 2022-09-12 DIAGNOSIS — I2510 Atherosclerotic heart disease of native coronary artery without angina pectoris: Secondary | ICD-10-CM | POA: Diagnosis not present

## 2022-09-12 LAB — URINALYSIS, ROUTINE W REFLEX MICROSCOPIC
Bilirubin Urine: NEGATIVE
Glucose, UA: NEGATIVE mg/dL
Hgb urine dipstick: NEGATIVE
Ketones, ur: NEGATIVE mg/dL
Leukocytes,Ua: NEGATIVE
Nitrite: NEGATIVE
Protein, ur: NEGATIVE mg/dL
Specific Gravity, Urine: 1.019 (ref 1.005–1.030)
pH: 5 (ref 5.0–8.0)

## 2022-09-12 LAB — CBG MONITORING, ED
Glucose-Capillary: 114 mg/dL — ABNORMAL HIGH (ref 70–99)
Glucose-Capillary: 106 mg/dL — ABNORMAL HIGH (ref 70–99)

## 2022-09-12 LAB — GLUCOSE, CAPILLARY: Glucose-Capillary: 106 mg/dL — ABNORMAL HIGH (ref 70–99)

## 2022-09-12 MED ORDER — SENNOSIDES-DOCUSATE SODIUM 8.6-50 MG PO TABS: 1.0000 | ORAL_TABLET | Freq: Every evening | ORAL | Status: DC | PRN

## 2022-09-12 MED ORDER — GUAIFENESIN 100 MG/5ML PO LIQD: 5.0000 mL | ORAL | Status: DC | PRN

## 2022-09-12 MED ORDER — INSULIN ASPART 100 UNIT/ML IJ SOLN: 0.0000 [IU] | Freq: Every day | INTRAMUSCULAR | Status: DC

## 2022-09-12 MED ORDER — INSULIN ASPART 100 UNIT/ML IJ SOLN: 0.0000 [IU] | Freq: Three times a day (TID) | INTRAMUSCULAR | Status: DC

## 2022-09-12 MED ORDER — TRAZODONE HCL 50 MG PO TABS: 50.0000 mg | ORAL_TABLET | Freq: Every evening | ORAL | Status: DC | PRN

## 2022-09-12 NOTE — ED Notes (Signed)
Patient has not urinated since 2100, bladder scan patient 536m. She keeps c/o having to urinated and can't. MD made aware. No new orders at this time. Will continue to monitor

## 2022-09-12 NOTE — TOC Initial Note (Signed)
Transition of Care [facility]) - Initial/Assessment Note    Patient Details  Name: Briana Hartman MRN: [id number] Date of Birth: Feb 24, 1934  Transition of Care [facility]) CM/SW Contact:    Verdell Carmine, RN Phone Number: 09/12/2022, 2:59 PM  Clinical Narrative:                 Patient just discharged on Sunday for R/O stroke,  back with aphasia.  She has been set up with [facility] for home health PT OT and SLP. She has all equipment needed, except she was supposed to get nocturnal oxygen. She has oxygen from [employer] previously. She would like a concentrator, as she is going from one son, to the others house every few weeks.   Nocturnal oxygen saturations continuous ordered, so that we can have documentation to substantiate oxygen.  CM will follow for needs, recommendations, and transitions of care.  Expected Discharge Plan: [place] Barriers to Discharge: Continued Medical Work up   Patient Goals and CMS Choice        Expected Discharge Plan and Services Expected Discharge Plan: [place]   Discharge Planning Services: CM Consult                                          Prior Living Arrangements/Services   Lives with:: Adult Children Patient language and need for interpreter reviewed:: Yes Do you feel safe going back to the place where you live?: Yes      Need for Family Participation in Patient Care: Yes (Comment) Care giver support system in place?: Yes (comment) Current home services: DME (walker shower chair) Criminal Activity/Legal Involvement Pertinent to Current Situation/Hospitalization: No - Comment as needed  Activities of Daily Living Home Assistive Devices/Equipment: Walker (specify type) ADL Screening (condition at time of admission) Patient's cognitive ability adequate to safely complete daily activities?: Yes Is the patient deaf or have difficulty hearing?: Yes Does the patient have difficulty seeing, even when  wearing glasses/contacts?: Yes Does the patient have difficulty concentrating, remembering, or making decisions?: Yes Patient able to express need for assistance with ADLs?: Yes Does the patient have difficulty dressing or bathing?: No Independently performs ADLs?: Yes (appropriate for developmental age) Does the patient have difficulty walking or climbing stairs?: Yes Weakness of Legs: Both Weakness of Arms/Hands: Both  Permission Sought/Granted                  Emotional Assessment Appearance:: Developmentally appropriate Attitude/Demeanor/Rapport: Gracious Affect (typically observed): Accepting Orientation: : Oriented to Self, Oriented to Place, Oriented to  Time, Oriented to Situation Alcohol / Substance Use: Not Applicable Psych Involvement: No (comment)  Admission diagnosis:  Aphasia [R47.01] TIA (transient ischemic attack) [G45.9] Patient Active Problem List   Diagnosis Date Noted   Aphasia 09/11/2022   HTN (hypertension) 09/11/2022   CAD S/P percutaneous coronary angioplasty 09/05/2022   UTI (urinary tract infection) 09/05/2022   Hypertensive urgency 09/05/2022   orbital mass  09/05/2022   TIA (transient ischemic attack) 09/04/2022   Stage 3a chronic kidney disease ([place]) 01/08/2022   Factor 5 Leiden mutation, heterozygous with hx of PE 03/13/2021   Cataract    Anemia    Adenomatous colon polyp    Violation of controlled substance agreement 06/08/2019   Long-term current use of benzodiazepine 05/18/2019   GAD (generalized anxiety disorder) 04/24/2019   Statin intolerance  04/24/2019   Allergic contact dermatitis due to drugs in contact with skin 06/16/2018   Achilles tendonitis, bilateral 06/07/2018   Graves' disease 04/22/2018   Hypertropia of right eye 04/22/2018   Keratoconjunctivitis sicca of both eyes not specified as Sjogren's 04/22/2018   Long term current use of oral hypoglycemic drug 04/22/2018   Type 2 diabetes mellitus without complication, without  long-term current use of insulin ([place]) 04/22/2018   Vitreous floater, bilateral 04/22/2018   Amputated toe, left ([place]) 03/04/2017   Diabetic peripheral neuropathy ([place]) 10/23/2016   Abnormal INR 04/29/2016   Arthritis 12/26/2015   Stage 2 moderate COPD by GOLD classification ([place]) 12/26/2015   Anxiety 12/26/2015   Asthma 12/26/2015   Fundic gland polyps of stomach, benign 03/06/2015   Hyperplastic polyp of stomach 03/06/2015   Fundic gland polyposis of stomach 03/06/2015   Insomnia 11/30/2012   History of pulmonary embolism 08/30/2012   Hearing loss, right 09/16/2011   Long term current use of anticoagulant 09/16/2011   Cervical radiculopathy 09/16/2011   History of anticoagulant therapy 09/16/2011   Allergy    Skin cancer    Hyperlipidemia    Hypertensive heart disease    Hypothyroidism    Lumbar spinal stenosis    Peripheral neuropathy    COLONIC POLYPS, HX OF 06/22/2009   Irritable bowel syndrome 05/11/2009   MITRAL VALVE PROLAPSE 04/25/2009   Venous thromboembolism (VTE) 04/25/2009   ACID REFLUX DISEASE 04/25/2009   VENTRAL HERNIA 04/25/2009   Mitral valve disease 04/25/2009   PCP:  Curt Jews, PA-C Pharmacy:   [facility] [address], [place] - [address] [place] [address] Phone: [phone] Fax: [phone]  [id number] - [place], [place] - [address] [place] [address] Phone: [phone] Fax: [phone]  [facility] - [place], [place] [address] Phone: [phone] Fax: [phone]     Social Determinants of Health (SDOH) Interventions Housing Interventions: Intervention Not Indicated  Readmission Risk Interventions     No data to display

## 2022-09-12 NOTE — Progress Notes (Signed)
PROGRESS NOTE    Briana Hartman  TFT:[id number] DOB: [birth year] DOA: 09/11/2022 PCP: Curt Jews, PA-C   Brief Narrative:  88 y.o. female with medical history significant of factor V leiden mutation with hx of PE, T2DM, CAD with PCI in 2022, HTN, HLD, CKD stage 3a, hypothyroidism, COPD with nocturnal hypoxia, GAD who presents with aphasia. She was recently discharged on 09/04/2022 with TIA thought secondary to her hypercoagulability and subtherapeutic INR. Had incidental finding of hemangioma on CTA. She was transition from warfarin to Eliquis due to frequent subtherapeutic INR and started on Tricor and Zetia due to statin allergy. In ED this admission, CT head was negative.  Patient was evaluated by neurology who recommends admission for further stroke work-up with MRI brain.    Assessment & Plan:  Principal Problem:   Aphasia Active Problems:   CAD S/P percutaneous coronary angioplasty   Type 2 diabetes mellitus without complication, without long-term current use of insulin (HCC)   Hyperlipidemia   Factor 5 Leiden mutation, heterozygous with hx of PE   Stage 2 moderate COPD by GOLD classification ([place])   Stage 3a chronic kidney disease (HCC)   Hypothyroidism   HTN (hypertension)     Assessment and Plan: * Aphasia -Recent TIA in 9/23.  Coumadin was changed to Eliquis due to subtherapeutic INRs.  CT of the head during this admission in the ER is negative.  Continue Eliquis 5 mg twice daily.  Zetia and Tricor.  Permissive hypertension.  There is also concerns of atypical migraine - MRI brain= negative for acute infarct.  9 mm lesion in right globe, stable.  CAD S/P percutaneous coronary angioplasty Asymptomatic from cardiac standpoint.  Continue home Imdur - Home Coreg, Norvasc, Micardis are on hold  Type 2 diabetes mellitus without complication, without long-term current use of insulin (HCC) Controlled.  Last hemoglobin A1c in the on 9/16 of 6.5.  Sliding scale and  Accu-Cheks Home metformin  Hyperlipidemia Continue Zetia and Tricor due to statin allergy  Factor 5 Leiden mutation, heterozygous with hx of PE On Eliquis  Stage 2 moderate COPD by GOLD classification ([place]) Stable.  Not in any acute exacerbation.  As needed bronchodilators scheduled and as needed  Stage 3a chronic kidney disease (HCC) Creatinine is stable.  Hypothyroidism Continue levothyroxine  HTN (hypertension) Allow for permissive hypertension    DVT prophylaxis: Eliquis Code Status: Partial Family Communication: Son is present at bedside  Ongoing evaluation by neurology for possible stroke   Subjective: Seen and examined at bedside, does not have any new complaints.   Examination:  General exam: Appears calm and comfortable  Respiratory system: Clear to auscultation. Respiratory effort normal. Cardiovascular system: S1 & S2 heard, RRR. No JVD, murmurs, rubs, gallops or clicks. No pedal edema. Gastrointestinal system: Abdomen is nondistended, soft and nontender. No organomegaly or masses felt. Normal bowel sounds heard. Central nervous system: Alert and oriented. No focal neurological deficits. Extremities: Symmetric 5 x 5 power. Skin: No rashes, lesions or ulcers Psychiatry: Judgement and insight appear normal. Mood & affect appropriate.     Objective: Vitals:   09/12/22 0500 09/12/22 0530 09/12/22 0615 09/12/22 0645  BP: (!) 106/49 (!) 121/57 (!) 141/61 (!) 171/78  Pulse: (!) 54 63 62 62  Resp: '15 16 18 16  '$ Temp:      TempSrc:      SpO2: 97% 97% 96% 97%  Weight:      Height:        Intake/Output Summary (Last 24 hours)  at 09/12/2022 0903 Last data filed at 09/12/2022 0657 Gross per 24 hour  Intake --  Output 150 ml  Net -150 ml   Filed Weights   09/11/22 1618  Weight: 82.6 kg     Data Reviewed:   CBC: Recent Labs  Lab 09/11/22 1616 09/11/22 1628  WBC 11.2*  --   NEUTROABS 6.8  --   HGB 13.0 13.9  HCT 41.7 41.0  MCV 91.2  --    PLT 310  --    Basic Metabolic Panel: Recent Labs  Lab 09/07/22 0631 09/11/22 1616 09/11/22 1628  NA 140 138 138  K 3.8 4.0 4.0  CL 105 100 99  CO2 27 29  --   GLUCOSE 95 119* 113*  BUN 19 26* 29*  CREATININE 0.92 0.94 0.90  CALCIUM 8.7* 9.5  --    GFR: Estimated Creatinine Clearance: 48.6 mL/min (by C-G formula based on SCr of 0.9 mg/dL). Liver Function Tests: Recent Labs  Lab 09/11/22 1616  AST 17  ALT 12  ALKPHOS 37*  BILITOT 0.4  PROT 6.3*  ALBUMIN 3.3*   No results for input(s): "LIPASE", "AMYLASE" in the last 168 hours. No results for input(s): "AMMONIA" in the last 168 hours. Coagulation Profile: Recent Labs  Lab 09/06/22 0744 09/07/22 0631 09/11/22 1616  INR 1.8* 2.2* 1.4*   Cardiac Enzymes: No results for input(s): "CKTOTAL", "CKMB", "CKMBINDEX", "TROPONINI" in the last 168 hours. BNP (last 3 results) No results for input(s): "PROBNP" in the last 8760 hours. HbA1C: No results for input(s): "HGBA1C" in the last 72 hours. CBG: Recent Labs  Lab 09/06/22 1311 09/06/22 1612 09/06/22 2143 09/07/22 0757 09/11/22 1621  GLUCAP 135* 117* 125* 111* 114*   Lipid Profile: No results for input(s): "CHOL", "HDL", "LDLCALC", "TRIG", "CHOLHDL", "LDLDIRECT" in the last 72 hours. Thyroid Function Tests: No results for input(s): "TSH", "T4TOTAL", "FREET4", "T3FREE", "THYROIDAB" in the last 72 hours. Anemia Panel: No results for input(s): "VITAMINB12", "FOLATE", "FERRITIN", "TIBC", "IRON", "RETICCTPCT" in the last 72 hours. Sepsis Labs: No results for input(s): "PROCALCITON", "LATICACIDVEN" in the last 168 hours.  Recent Results (from the past 240 hour(s))  Resp Panel by RT-PCR (Flu A&B, Covid) Anterior Nasal Swab     Status: None   Collection Time: 09/04/22  4:10 PM   Specimen: Anterior Nasal Swab  Result Value Ref Range Status   SARS Coronavirus 2 by RT PCR NEGATIVE NEGATIVE Final    Comment: (NOTE) SARS-CoV-2 target nucleic acids are NOT  DETECTED.  The SARS-CoV-2 RNA is generally detectable in upper respiratory specimens during the acute phase of infection. The lowest concentration of SARS-CoV-2 viral copies this assay can detect is 138 copies/mL. A negative result does not preclude SARS-Cov-2 infection and should not be used as the sole basis for treatment or other patient management decisions. A negative result may occur with  improper specimen collection/handling, submission of specimen other than nasopharyngeal swab, presence of viral mutation(s) within the areas targeted by this assay, and inadequate number of viral copies(<138 copies/mL). A negative result must be combined with clinical observations, patient history, and epidemiological information. The expected result is Negative.  Fact Sheet for Patients:  EntrepreneurPulse.com.au  Fact Sheet for Healthcare Providers:  IncredibleEmployment.be  This test is no t yet approved or cleared by the [place] FDA and  has been authorized for detection and/or diagnosis of SARS-CoV-2 by FDA under an Emergency Use Authorization (EUA). This EUA will remain  in effect (meaning this test can be used) for  the duration of the COVID-19 declaration under Section 564(b)(1) of the Act, 21 U.S.C.section 360bbb-3(b)(1), unless the authorization is terminated  or revoked sooner.       Influenza A by PCR NEGATIVE NEGATIVE Final   Influenza B by PCR NEGATIVE NEGATIVE Final    Comment: (NOTE) The Xpert Xpress SARS-CoV-2/FLU/RSV plus assay is intended as an aid in the diagnosis of influenza from Nasopharyngeal swab specimens and should not be used as a sole basis for treatment. Nasal washings and aspirates are unacceptable for Xpert Xpress SARS-CoV-2/FLU/RSV testing.  Fact Sheet for Patients: EntrepreneurPulse.com.au  Fact Sheet for Healthcare Providers: IncredibleEmployment.be  This test is not yet  approved or cleared by the [place] FDA and has been authorized for detection and/or diagnosis of SARS-CoV-2 by FDA under an Emergency Use Authorization (EUA). This EUA will remain in effect (meaning this test can be used) for the duration of the COVID-19 declaration under Section 564(b)(1) of the Act, 21 U.S.C. section 360bbb-3(b)(1), unless the authorization is terminated or revoked.  Performed at [facility], [address] [place], [place] [address]   Urine Culture     Status: Abnormal   Collection Time: 09/05/22  4:39 PM   Specimen: Urine, Clean Catch  Result Value Ref Range Status   Specimen Description URINE, CLEAN CATCH  Final   Special Requests NONE  Final   Culture (A)  Final    <10,000 COLONIES/mL INSIGNIFICANT GROWTH Performed at [facility], [place]. [address], [place], [place] [address]    Report Status 09/06/2022 FINAL  Final         Radiology Studies: MR BRAIN WO CONTRAST  Result Date: 09/12/2022 CLINICAL DATA:  Initial evaluation for acute TIA. EXAM: MRI HEAD WITHOUT CONTRAST TECHNIQUE: Multiplanar, multiecho pulse sequences of the brain and surrounding structures were obtained without intravenous contrast. COMPARISON:  Prior CT from earlier the same day. FINDINGS: Brain: Cerebral volume within normal limits. Mild chronic microvascular ischemic disease. No evidence for acute or subacute ischemia. Gray-white matter differentiation maintained. No areas of chronic cortical infarction. No acute or chronic intracranial blood products. No mass lesion, midline shift or mass effect. No hydrocephalus or extra-axial fluid collection. Pituitary gland suprasellar region normal. Vascular: Major intracranial vascular flow voids are maintained. Skull and upper cervical spine: Craniocervical junction normal. Bone marrow signal intensity diffusely decreased on T1 weighted sequence, nonspecific, but most commonly related to anemia, smoking or obesity. No focal  marrow replacing lesion. No scalp soft tissue abnormality. Sinuses/Orbits: Patient status post ocular lens replacement on the right. 9 mm FLAIR hyperintense lesion at the medial aspect of the right globe again noted, stable, and indeterminate. P mild mucosal thickening within the ethmoidal air cells. Paranasal sinuses are otherwise largely clear. No significant mastoid effusion. Other: Small retention cyst noted within the nasopharynx. IMPRESSION: 1. No acute intracranial abnormality. 2. Mild chronic microvascular ischemic disease. 3. 9 mm FLAIR hyperintense lesion at the medial aspect of the right globe, stable, and indeterminate. Correlation with physical exam recommended. Electronically Signed   By: Jeannine Boga M.D.   On: 09/12/2022 00:28   CT HEAD CODE STROKE WO CONTRAST  Result Date: 09/11/2022 CLINICAL DATA:  Code stroke.  Speech and vision deficit EXAM: CT HEAD WITHOUT CONTRAST TECHNIQUE: Contiguous axial images were obtained from the base of the skull through the vertex without intravenous contrast. RADIATION DOSE REDUCTION: This exam was performed according to the departmental dose-optimization program which includes automated exposure control, adjustment of the mA and/or  kV according to patient size and/or use of iterative reconstruction technique. COMPARISON:  09/04/2022 FINDINGS: Evaluation is somewhat limited by motion artifact. Brain: No evidence of acute infarction, hemorrhage, cerebral edema, mass, mass effect, or midline shift. No hydrocephalus or extra-axial collection. Vascular: No hyperdense vessel. Atherosclerotic calcifications in the intracranial carotid and vertebral arteries. Skull: Hyperostosis frontalis. Negative for fracture or focal lesion. Sinuses/Orbits: No acute finding.  Right lens replacement. Other: The mastoid air cells are well aerated. ASPECTS [facility] Stroke Program Early CT Score) - Ganglionic level infarction (caudate, lentiform nuclei, internal capsule, insula,  M1-M3 cortex): 7 - Supraganglionic infarction (M4-M6 cortex): 3 Total score (0-10 with 10 being normal): 10 IMPRESSION: 1. No acute intracranial process. 2. ASPECTS is 10 Code stroke imaging results were communicated on 09/11/2022 at 4:30 pm to provider Dr. Cheral Marker via secure text paging. Electronically Signed   By: Merilyn Baba M.D.   On: 09/11/2022 16:31        Scheduled Meds:   stroke: early stages of recovery book   Does not apply Once   apixaban  5 mg Oral BID   ezetimibe  10 mg Oral Daily   fenofibrate  160 mg Oral Daily   fluticasone furoate-vilanterol  1 puff Inhalation Daily   gabapentin  300 mg Oral TID   isosorbide mononitrate  60 mg Oral Daily   levothyroxine  50 mcg Oral QAC breakfast   pantoprazole  40 mg Oral Daily   Continuous Infusions:   LOS: 0 days   Time spent= 35 mins    Lyris Hitchman Arsenio Loader, MD [facility]  If 7PM-7AM, please contact night-coverage  09/12/2022, 9:03 AM

## 2022-09-12 NOTE — Discharge Summary (Signed)
Physician Discharge Summary  Briana Hartman IRS:[id number] DOB: 1934-09-20 DOA: 09/11/2022  PCP: Curt Jews, PA-C  Admit date: 09/11/2022 Discharge date: 09/12/2022  Admitted From: Disposition:    Recommendations for Outpatient Follow-up:  Follow up with PCP in 1-2 weeks Please obtain BMP/CBC in one week your next doctors visit.    Home Health: Equipment/Devices: Discharge Condition: Stable CODE STATUS:  Diet recommendation:   Brief/Interim Summary:    Assessment and Plan: * Aphasia -recent TIA in 08/2022 and transition from warfarin to Eliquis due to frequent subtherapeutic INR.  Presents today with aphasia and left-sided upper and lower extremity weakness. -Has been evaluated by neurology with initial concerns of left temporal arthritis although patient endorsed to me that she had right-sided headache that has now radiated as a frontal headache.  CRP is only mildly elevated with a normal sed rate.  Low suspicion for temporal arthritis at this time -CT head negative - MRI brain without contrast is pending - Continue Eliquis per neurology pending MRI - Continue Zetia and Tricor - Allow for permissive hypertension - Frequent neurochecks  CAD S/P percutaneous coronary angioplasty Asymptomatic from cardiac standpoint.  Continue home medication.  Type 2 diabetes mellitus without complication, without long-term current use of insulin (HCC) Controlled.  Last hemoglobin A1c in the on 9/16 of 6.5.  Hyperlipidemia Continue Zetia and Tricor due to statin allergy  Factor 5 Leiden mutation, heterozygous with hx of PE On Eliquis  Stage 2 moderate COPD by GOLD classification ([place]) Stable.  Not in any acute exacerbation.  -Has nocturnal hypoxia-recently recommended to be on night time O2 but has not received equipment yet. Follows with [facility] pulmonology. Will start 2L via [place] qHS here. Main O2 88-92%  Continue home bronchodilator.  Stage 3a chronic kidney disease  (HCC) Creatinine is stable.  Hypothyroidism Continue levothyroxine  HTN (hypertension) Allow for permissive hypertension       Body mass index is 29.38 kg/m.       Discharge Diagnoses:  Principal Problem:   Aphasia Active Problems:   CAD S/P percutaneous coronary angioplasty   Type 2 diabetes mellitus without complication, without long-term current use of insulin (HCC)   Hyperlipidemia   Factor 5 Leiden mutation, heterozygous with hx of PE   Stage 2 moderate COPD by GOLD classification (HCC)   Stage 3a chronic kidney disease (HCC)   Hypothyroidism   HTN (hypertension)      Consultations: ***  Subjective:   Discharge Exam: Vitals:   09/12/22 1434 09/12/22 1600  BP: (!) 127/59 (!) 131/58  Pulse: 61 (!) 59  Resp: 18 18  Temp: 98 F (36.7 C) 98.4 F (36.9 C)  SpO2: 96% 92%   Vitals:   09/12/22 1300 09/12/22 1330 09/12/22 1434 09/12/22 1600  BP: (!) 148/63 (!) 149/84 (!) 127/59 (!) 131/58  Pulse: (!) 57 64 61 (!) 59  Resp: 17 18 18 18   Temp:   98 F (36.7 C) 98.4 F (36.9 C)  TempSrc:   Oral Oral  SpO2: 97% 98% 96% 92%  Weight:      Height:        General: Pt is alert, awake, not in acute distress Cardiovascular: RRR, S1/S2 +, no rubs, no gallops Respiratory: CTA bilaterally, no wheezing, no rhonchi Abdominal: Soft, NT, ND, bowel sounds + Extremities: no edema, no cyanosis  Discharge Instructions   Allergies as of 09/12/2022       Reactions   Aspirin    unknown   Caffeine  unknown   Codeine Hives   Hydroxyzine    Other reaction(s): Other (See Comments) Nightmares and shaky hands   Lipitor [atorvastatin] Other (See Comments)   Headache, numbness   Ampicillin Rash   Ketoconazole Rash   Topical cream caused rash   Penicillins Rash        Medication List     TAKE these medications    Abdominal Binder/Elastic XL Misc 1 Device by Does not apply route as needed (to prevent abdominal pain from hernia).   albuterol 108 (90  Base) MCG/ACT inhaler Commonly known as: VENTOLIN HFA Inhale 1 puff into the lungs every 6 (six) hours as needed for shortness of breath.   amLODipine 10 MG tablet Commonly known as: NORVASC Take 10 mg by mouth daily.   apixaban 5 MG Tabs tablet Commonly known as: ELIQUIS Take 1 tablet (5 mg total) by mouth 2 (two) times daily. Kindly provide 1 month supply with the free 1 month coupon, future supplies per PCP   B-D SINGLE USE SWABS REGULAR Pads   Breo Ellipta 100-25 MCG/ACT Aepb Generic drug: fluticasone furoate-vilanterol Inhale 1 puff into the lungs daily.   carvedilol 6.25 MG tablet Commonly known as: Coreg Take 1 tablet (6.25 mg total) by mouth 2 (two) times daily.   ezetimibe 10 MG tablet Commonly known as: ZETIA Take 1 tablet (10 mg total) by mouth daily.   fenofibrate 145 MG tablet Commonly known as: Tricor Take 1 tablet (145 mg total) by mouth daily.   fluticasone 220 MCG/ACT inhaler Commonly known as: FLOVENT HFA Inhale 1 puff into the lungs daily as needed (For shortness of breath).   furosemide 40 MG tablet Commonly known as: LASIX Take 1 tablet (40 mg total) by mouth 2 (two) times daily. What changed: when to take this   gabapentin 300 MG capsule Commonly known as: NEURONTIN Take 300 mg by mouth 3 (three) times daily.   isosorbide mononitrate 60 MG 24 hr tablet Commonly known as: IMDUR Take 1 tablet (60 mg total) by mouth daily.   levothyroxine 50 MCG tablet Commonly known as: SYNTHROID Take 50 mcg by mouth daily before breakfast.   metFORMIN 500 MG tablet Commonly known as: GLUCOPHAGE Take 500 mg by mouth daily with breakfast.   nitroGLYCERIN 0.4 MG SL tablet Commonly known as: NITROSTAT Place 1 tablet (0.4 mg total) under the tongue every 5 (five) minutes as needed. What changed: reasons to take this   omeprazole 20 MG capsule Commonly known as: PRILOSEC Take 20 mg by mouth daily.   telmisartan 40 MG tablet Commonly known as:  MICARDIS Take 1 tablet (40 mg total) by mouth daily.   True Metrix Air Glucose Meter w/Device Kit   True Metrix Blood Glucose Test test strip Generic drug: glucose blood TEST TWO TIMES DAILY   TRUEplus Lancets 33G Misc        Follow-up Information     [facility] Follow up.   Why: resume PT OT Contact information: [place] [address] [address], [place] [facility] Follow up.   Why: your oxygen provider Contact information: [address] [place] [address] [phone]         Diamantina Providence H, PA-C Follow up in 1 week(s).   Specialty: Physician Assistant Contact information: [address] [place] [address] [phone]                Allergies  Allergen Reactions   Aspirin     unknown   Caffeine     unknown   Codeine Hives   Hydroxyzine     Other reaction(s): Other (See Comments) Nightmares and shaky hands   Lipitor [Atorvastatin] Other (See Comments)    Headache, numbness   Ampicillin Rash   Ketoconazole Rash    Topical cream caused rash   Penicillins Rash    You were cared for by a hospitalist during your hospital stay. If you have any questions about your discharge medications or the care you received while you were in the hospital after you are discharged, you can call the unit and asked to speak with the hospitalist on call if the hospitalist that took care of you is not available. Once you are discharged, your primary care physician will handle any further medical issues. Please note that no refills for any discharge medications will be authorized once you are discharged, as it is imperative that you return to your primary care physician (or establish a relationship with a primary care physician if you do not have one) for your aftercare needs so that they can reassess your need for medications and monitor your lab values.   Procedures/Studies: MR  BRAIN WO CONTRAST  Result Date: 09/12/2022 CLINICAL DATA:  Initial evaluation for acute TIA. EXAM: MRI HEAD WITHOUT CONTRAST TECHNIQUE: Multiplanar, multiecho pulse sequences of the brain and surrounding structures were obtained without intravenous contrast. COMPARISON:  Prior CT from earlier the same day. FINDINGS: Brain: Cerebral volume within normal limits. Mild chronic microvascular ischemic disease. No evidence for acute or subacute ischemia. Gray-white matter differentiation maintained. No areas of chronic cortical infarction. No acute or chronic intracranial blood products. No mass lesion, midline shift or mass effect. No hydrocephalus or extra-axial fluid collection. Pituitary gland suprasellar region normal. Vascular: Major intracranial vascular flow voids are maintained. Skull and upper cervical spine: Craniocervical junction normal. Bone marrow signal intensity diffusely decreased on T1 weighted sequence, nonspecific, but most commonly related to anemia, smoking or obesity. No focal marrow replacing lesion. No scalp soft tissue abnormality. Sinuses/Orbits: Patient status post ocular lens replacement on the right. 9 mm FLAIR hyperintense lesion at the medial aspect of the right globe again noted, stable, and indeterminate. P mild mucosal thickening within the ethmoidal air cells. Paranasal sinuses are otherwise largely clear. No significant mastoid effusion. Other: Small retention cyst noted within the nasopharynx. IMPRESSION: 1. No acute intracranial abnormality. 2. Mild chronic microvascular ischemic disease. 3. 9 mm FLAIR hyperintense lesion at the medial aspect of the right globe, stable, and indeterminate. Correlation with physical exam recommended. Electronically Signed   By: Jeannine Boga M.D.   On: 09/12/2022 00:28   CT HEAD CODE STROKE WO CONTRAST  Result Date: 09/11/2022 CLINICAL DATA:  Code stroke.  Speech and vision deficit EXAM: CT HEAD WITHOUT CONTRAST TECHNIQUE: Contiguous axial  images were obtained from the base of the skull through the vertex without intravenous contrast. RADIATION DOSE REDUCTION: This exam was performed according to the departmental dose-optimization program which includes automated exposure control, adjustment of the mA and/or kV according to patient size and/or use of iterative reconstruction technique. COMPARISON:  09/04/2022 FINDINGS: Evaluation is somewhat limited by motion artifact. Brain: No evidence of acute infarction, hemorrhage, cerebral edema, mass, mass effect, or midline shift. No hydrocephalus or extra-axial collection. Vascular: No hyperdense vessel. Atherosclerotic calcifications in the intracranial carotid and vertebral arteries. Skull: Hyperostosis frontalis. Negative for fracture or focal lesion. Sinuses/Orbits: No acute finding.  Right  lens replacement. Other: The mastoid air cells are well aerated. ASPECTS [facility] Stroke Program Early CT Score) - Ganglionic level infarction (caudate, lentiform nuclei, internal capsule, insula, M1-M3 cortex): 7 - Supraganglionic infarction (M4-M6 cortex): 3 Total score (0-10 with 10 being normal): 10 IMPRESSION: 1. No acute intracranial process. 2. ASPECTS is 10 Code stroke imaging results were communicated on 09/11/2022 at 4:30 pm to provider Dr. Cheral Marker via secure text paging. Electronically Signed   By: Merilyn Baba M.D.   On: 09/11/2022 16:31   ECHOCARDIOGRAM COMPLETE  Result Date: 09/06/2022    ECHOCARDIOGRAM REPORT   Patient Name:   Briana Hartman Date of Exam: 09/06/2022 Medical Rec #:  [id number]           Height:       66.0 in Accession #:    [id number]          Weight:       182.2 lb Date of Birth:  Mar 26, 1934           BSA:          1.922 m Patient Age:    30 years            BP:           124/51 mmHg Patient Gender: F                   HR:           64 bpm. Exam Location:  Inpatient Procedure: 2D Echo, Cardiac Doppler and Color Doppler Indications:    TIA  History:        Patient has prior history  of Echocardiogram examinations, most                 recent 02/20/2020. CAD, COPD; Risk Factors:Hypertension, Diabetes                 and Dyslipidemia. CKD. Hx pulmonary embolism.  Sonographer:    Clayton Lefort RDCS (AE) Referring Phys: [id number] [place]  1. Left ventricular ejection fraction, by estimation, is 60 to 65%. The left ventricle has normal function. The left ventricle has no regional wall motion abnormalities. There is moderate concentric left ventricular hypertrophy. Left ventricular diastolic parameters are consistent with Grade I diastolic dysfunction (impaired relaxation). Elevated left atrial pressure.  2. Right ventricular systolic function is normal. The right ventricular size is normal.  3. The mitral valve is normal in structure. No evidence of mitral valve regurgitation. No evidence of mitral stenosis.  4. The aortic valve is normal in structure. There is mild calcification of the aortic valve. Aortic valve regurgitation is not visualized. Aortic valve sclerosis is present, with no evidence of aortic valve stenosis.  5. The inferior vena cava is normal in size with greater than 50% respiratory variability, suggesting right atrial pressure of 3 mmHg. FINDINGS  Left Ventricle: Left ventricular ejection fraction, by estimation, is 60 to 65%. The left ventricle has normal function. The left ventricle has no regional wall motion abnormalities. The left ventricular internal cavity size was normal in size. There is  moderate concentric left ventricular hypertrophy. Left ventricular diastolic parameters are consistent with Grade I diastolic dysfunction (impaired relaxation). Elevated left atrial pressure. Right Ventricle: The right ventricular size is normal. No increase in right ventricular wall thickness. Right ventricular systolic function is normal. Left Atrium: Left atrial size was normal in size. Right Atrium: Right atrial size was normal in size. Pericardium: There is no evidence of  pericardial effusion. Mitral Valve: The mitral valve is normal in structure. No evidence of mitral valve regurgitation. No evidence of mitral valve stenosis. Tricuspid Valve: The tricuspid valve is normal in structure. Tricuspid valve regurgitation is not demonstrated. No evidence of tricuspid stenosis. Aortic Valve: The aortic valve is normal in structure. There is mild calcification of the aortic valve. Aortic valve regurgitation is not visualized. Aortic valve sclerosis is present, with no evidence of aortic valve stenosis. Aortic valve mean gradient  measures 3.0 mmHg. Aortic valve peak gradient measures 6.5 mmHg. Aortic valve area, by VTI measures 2.89 cm. Pulmonic Valve: The pulmonic valve was not well visualized. Pulmonic valve regurgitation is not visualized. No evidence of pulmonic stenosis. Aorta: The aortic root is normal in size and structure. Venous: The inferior vena cava is normal in size with greater than 50% respiratory variability, suggesting right atrial pressure of 3 mmHg. IAS/Shunts: No atrial level shunt detected by color flow Doppler.  LEFT VENTRICLE PLAX 2D LVIDd:         4.90 cm   Diastology LVIDs:         3.20 cm   LV e' medial:    5.22 cm/s LV PW:         1.44 cm   LV E/e' medial:  14.4 LV IVS:        1.40 cm   LV e' lateral:   5.22 cm/s LVOT diam:     2.30 cm   LV E/e' lateral: 14.4 LV SV:         84 LV SV Index:   44 LVOT Area:     4.15 cm  RIGHT VENTRICLE             IVC RV Basal diam:  3.00 cm     IVC diam: 1.70 cm RV S prime:     14.00 cm/s TAPSE (M-mode): 2.0 cm LEFT ATRIUM             Index        RIGHT ATRIUM           Index LA diam:        3.30 cm 1.72 cm/m   RA Area:     13.20 cm LA Vol (A2C):   44.1 ml 22.94 ml/m  RA Volume:   31.00 ml  16.13 ml/m LA Vol (A4C):   37.9 ml 19.72 ml/m LA Biplane Vol: 44.9 ml 23.36 ml/m  AORTIC VALVE AV Area (Vmax):    3.18 cm AV Area (Vmean):   3.06 cm AV Area (VTI):     2.89 cm AV Vmax:           127.00 cm/s AV Vmean:          84.100  cm/s AV VTI:            0.290 m AV Peak Grad:      6.5 mmHg AV Mean Grad:      3.0 mmHg LVOT Vmax:         97.30 cm/s LVOT Vmean:        61.900 cm/s LVOT VTI:          0.202 m LVOT/AV VTI ratio: 0.70  AORTA Ao Root diam: 3.40 cm Ao Asc diam:  3.50 cm MITRAL VALVE MV Area (PHT): 2.55 cm    SHUNTS MV Decel Time: 298 msec    Systemic VTI:  0.20 m MV E velocity: 75.20 cm/s  Systemic Diam: 2.30 cm MV A velocity: 85.90 cm/s MV  E/A ratio:  0.88 Mihai Croitoru MD Electronically signed by Sanda Klein MD Signature Date/Time: 09/06/2022/2:35:54 PM    Final    MR BRAIN WO CONTRAST  Result Date: 09/05/2022 CLINICAL DATA:  Acute neuro deficit.  Rule out stroke EXAM: MRI HEAD WITHOUT CONTRAST TECHNIQUE: Multiplanar, multiecho pulse sequences of the brain and surrounding structures were obtained without intravenous contrast. COMPARISON:  CT head 09/04/2022 FINDINGS: Brain: No acute infarction, hemorrhage, hydrocephalus, extra-axial collection or mass lesion. Mild white matter changes with patchy periventricular deep white matter hyperintensities bilaterally. Brainstem intact. Vascular: Normal arterial flow voids Skull and upper cervical spine: No focal skeletal abnormality Sinuses/Orbits: 9 mm mass medial to the right orbit as noted on CT yesterday. Possible hemangioma. Right cataract extraction paranasal sinuses clear. Other: None IMPRESSION: Negative for acute infarct Mild chronic microvascular ischemic change 9 mm mass medial to the right globe. Electronically Signed   By: Franchot Gallo M.D.   On: 09/05/2022 18:57   CT ANGIO HEAD NECK W WO CM  Result Date: 09/04/2022 CLINICAL DATA:  Stroke follow-up. Right-sided facial droop with slurred speech. EXAM: CT ANGIOGRAPHY HEAD AND NECK TECHNIQUE: Multidetector CT imaging of the head and neck was performed using the standard protocol during bolus administration of intravenous contrast. Multiplanar CT image reconstructions and MIPs were obtained to evaluate the vascular  anatomy. Carotid stenosis measurements (when applicable) are obtained utilizing NASCET criteria, using the distal internal carotid diameter as the denominator. RADIATION DOSE REDUCTION: This exam was performed according to the departmental dose-optimization program which includes automated exposure control, adjustment of the mA and/or kV according to patient size and/or use of iterative reconstruction technique. CONTRAST:  53m OMNIPAQUE IOHEXOL 350 MG/ML SOLN COMPARISON:  Head CT 12/11/2012 FINDINGS: CT HEAD FINDINGS Brain: There is no evidence of an acute infarct, intracranial hemorrhage, mass, midline shift, or extra-axial fluid collection. The ventricles and sulci are normal for age. Hypodensities in the cerebral white matter bilaterally are unchanged and nonspecific but compatible with mild chronic small vessel ischemic disease. Vascular: Calcified atherosclerosis at the skull base. No hyperdense vessel. Skull: No fracture or suspicious osseous lesion. Sinuses/Orbits: Paranasal sinuses and mastoid air cells are clear. Right cataract extraction. 9 mm mass in the anterior aspect of the right orbit medial to the globe which was also present in 2020 though is slightly larger today, partially enhances on the CTA images, and appears to be associated with a vein, overall benign in appearance and potentially reflecting a cavernous venous malformation or varix. Other: None. Review of the MIP images confirms the above findings CTA NECK FINDINGS Aortic arch: Standard 3 vessel aortic arch with mild atherosclerotic plaque. Patent brachiocephalic and subclavian arteries with mixed soft and calcified plaque in the proximal aspects of both subclavian arteries resulting in luminal irregularity but no flow limiting stenosis. Right carotid system: Patent with a small amount of mixed calcified and soft plaque in the carotid bulb. No evidence of a significant stenosis or dissection. Tortuous common and proximal internal carotid  arteries. Left carotid system: Patent with a small amount of predominantly calcified plaque at the carotid bifurcation. No evidence of a significant stenosis or dissection. Tortuous mid cervical ICA. Vertebral arteries: Patent without evidence of a significant stenosis or dissection. Codominant. Skeleton: Advanced disc degeneration in the lower cervical spine. Asymmetrically advanced right-sided cervical facet arthrosis, greatest at C4-5. Moderate spinal stenosis at C6-7 due to prominent posterior vertebral osteophytes. Other neck: No evidence of cervical lymphadenopathy or mass. Upper chest: Clear lung apices. Review of the  MIP images confirms the above findings CTA HEAD FINDINGS Anterior circulation: The internal carotid arteries are patent from skull base to carotid termini with mild-to-moderate atherosclerotic plaque bilaterally not resulting in significant stenosis. ACAs and MCAs are patent without evidence of a proximal branch occlusion or a significant A1 or left M1 stenosis. There is a severe distal right M1 stenosis, and there is a moderate proximal right A2 stenosis. No aneurysm is identified. Posterior circulation: The intracranial vertebral arteries are patent to the basilar with mild atherosclerotic irregularity but no significant stenosis. Patent PICA, AICA, and SCA origins are seen bilaterally. The basilar artery is widely patent. There are small posterior communicating arteries bilaterally. Both PCAs are patent with atherosclerotic irregularity involving the branch vessels but no evidence of a flow limiting proximal stenosis. There is an early bifurcation of the left PCA. No aneurysm is identified. Venous sinuses: Patent. Anatomic variants: None. Review of the MIP images confirms the above findings IMPRESSION: 1. No evidence of acute intracranial abnormality. 2. No large vessel occlusion. 3. Intracranial atherosclerosis including severe distal right M1 and moderate right A2 stenoses. 4. Mild cervical  carotid artery atherosclerosis without significant stenosis. 5. 9 mm mass in the anterior right orbit, benign in appearance and potentially reflecting a cavernous venous malformation or varix. 6. Aortic Atherosclerosis (ICD10-I70.0). Electronically Signed   By: Logan Bores M.D.   On: 09/04/2022 18:03   DG Chest Portable 1 View  Result Date: 09/04/2022 CLINICAL DATA:  Chest pain EXAM: PORTABLE CHEST 1 VIEW COMPARISON:  05/31/2013, 02/22/2021 FINDINGS: Cardiomegaly. Aortic atherosclerosis. Central pulmonary vessels appears slightly enlarged. No pleural effusion or pneumothorax. IMPRESSION: 1. Cardiomegaly without edema or pleural effusion 2. Prominent central pulmonary vessels suggest arterial hypertension Electronically Signed   By: Donavan Foil M.D.   On: 09/04/2022 16:56     The results of significant diagnostics from this hospitalization (including imaging, microbiology, ancillary and laboratory) are listed below for reference.     Microbiology: Recent Results (from the past 240 hour(s))  Resp Panel by RT-PCR (Flu A&B, Covid) Anterior Nasal Swab     Status: None   Collection Time: 09/04/22  4:10 PM   Specimen: Anterior Nasal Swab  Result Value Ref Range Status   SARS Coronavirus 2 by RT PCR NEGATIVE NEGATIVE Final    Comment: (NOTE) SARS-CoV-2 target nucleic acids are NOT DETECTED.  The SARS-CoV-2 RNA is generally detectable in upper respiratory specimens during the acute phase of infection. The lowest concentration of SARS-CoV-2 viral copies this assay can detect is 138 copies/mL. A negative result does not preclude SARS-Cov-2 infection and should not be used as the sole basis for treatment or other patient management decisions. A negative result may occur with  improper specimen collection/handling, submission of specimen other than nasopharyngeal swab, presence of viral mutation(s) within the areas targeted by this assay, and inadequate number of viral copies(<138 copies/mL). A  negative result must be combined with clinical observations, patient history, and epidemiological information. The expected result is Negative.  Fact Sheet for Patients:  EntrepreneurPulse.com.au  Fact Sheet for Healthcare Providers:  IncredibleEmployment.be  This test is no t yet approved or cleared by the [place] FDA and  has been authorized for detection and/or diagnosis of SARS-CoV-2 by FDA under an Emergency Use Authorization (EUA). This EUA will remain  in effect (meaning this test can be used) for the duration of the COVID-19 declaration under Section 564(b)(1) of the Act, 21 U.S.C.section 360bbb-3(b)(1), unless the authorization is terminated  or revoked sooner.  Influenza A by PCR NEGATIVE NEGATIVE Final   Influenza B by PCR NEGATIVE NEGATIVE Final    Comment: (NOTE) The Xpert Xpress SARS-CoV-2/FLU/RSV plus assay is intended as an aid in the diagnosis of influenza from Nasopharyngeal swab specimens and should not be used as a sole basis for treatment. Nasal washings and aspirates are unacceptable for Xpert Xpress SARS-CoV-2/FLU/RSV testing.  Fact Sheet for Patients: EntrepreneurPulse.com.au  Fact Sheet for Healthcare Providers: IncredibleEmployment.be  This test is not yet approved or cleared by the [place] FDA and has been authorized for detection and/or diagnosis of SARS-CoV-2 by FDA under an Emergency Use Authorization (EUA). This EUA will remain in effect (meaning this test can be used) for the duration of the COVID-19 declaration under Section 564(b)(1) of the Act, 21 U.S.C. section 360bbb-3(b)(1), unless the authorization is terminated or revoked.  Performed at [facility], [address] [place], [place] [address]   Urine Culture     Status: Abnormal   Collection Time: 09/05/22  4:39 PM   Specimen: Urine, Clean Catch  Result Value Ref Range Status    Specimen Description URINE, CLEAN CATCH  Final   Special Requests NONE  Final   Culture (A)  Final    <10,000 COLONIES/mL INSIGNIFICANT GROWTH Performed at [facility], [place]. [address], [place], [place] [address]    Report Status 09/06/2022 FINAL  Final     Labs: BNP (last 3 results) No results for input(s): "BNP" in the last 8760 hours. Basic Metabolic Panel: Recent Labs  Lab 09/07/22 0631 09/11/22 1616 09/11/22 1628  NA 140 138 138  K 3.8 4.0 4.0  CL 105 100 99  CO2 27 29  --   GLUCOSE 95 119* 113*  BUN 19 26* 29*  CREATININE 0.92 0.94 0.90  CALCIUM 8.7* 9.5  --    Liver Function Tests: Recent Labs  Lab 09/11/22 1616  AST 17  ALT 12  ALKPHOS 37*  BILITOT 0.4  PROT 6.3*  ALBUMIN 3.3*   No results for input(s): "LIPASE", "AMYLASE" in the last 168 hours. No results for input(s): "AMMONIA" in the last 168 hours. CBC: Recent Labs  Lab 09/11/22 1616 09/11/22 1628  WBC 11.2*  --   NEUTROABS 6.8  --   HGB 13.0 13.9  HCT 41.7 41.0  MCV 91.2  --   PLT 310  --    Cardiac Enzymes: No results for input(s): "CKTOTAL", "CKMB", "CKMBINDEX", "TROPONINI" in the last 168 hours. BNP: Invalid input(s): "POCBNP" CBG: Recent Labs  Lab 09/06/22 2143 09/07/22 0757 09/11/22 1621 09/12/22 1203 09/12/22 1704  GLUCAP 125* 111* 114* 106* 106*   D-Dimer No results for input(s): "DDIMER" in the last 72 hours. Hgb A1c No results for input(s): "HGBA1C" in the last 72 hours. Lipid Profile No results for input(s): "CHOL", "HDL", "LDLCALC", "TRIG", "CHOLHDL", "LDLDIRECT" in the last 72 hours. Thyroid function studies No results for input(s): "TSH", "T4TOTAL", "T3FREE", "THYROIDAB" in the last 72 hours.  Invalid input(s): "FREET3" Anemia work up No results for input(s): "VITAMINB12", "FOLATE", "FERRITIN", "TIBC", "IRON", "RETICCTPCT" in the last 72 hours. Urinalysis    Component Value Date/Time   COLORURINE YELLOW 09/12/2022 [place] 09/12/2022  0655   LABSPEC 1.019 09/12/2022 0655   PHURINE 5.0 09/12/2022 0655   GLUCOSEU NEGATIVE 09/12/2022 0655   GLUCOSEU NEGATIVE 05/10/2014 [place] 09/12/2022 0655   BILIRUBINUR NEGATIVE 09/12/2022 0655   KETONESUR NEGATIVE 09/12/2022 0655   PROTEINUR NEGATIVE 09/12/2022  3825   UROBILINOGEN 0.2 05/10/2014 1424   NITRITE NEGATIVE 09/12/2022 0655   LEUKOCYTESUR NEGATIVE 09/12/2022 0655   Sepsis Labs Recent Labs  Lab 09/11/22 1616  WBC 11.2*   Microbiology Recent Results (from the past 240 hour(s))  Resp Panel by RT-PCR (Flu A&B, Covid) Anterior Nasal Swab     Status: None   Collection Time: 09/04/22  4:10 PM   Specimen: Anterior Nasal Swab  Result Value Ref Range Status   SARS Coronavirus 2 by RT PCR NEGATIVE NEGATIVE Final    Comment: (NOTE) SARS-CoV-2 target nucleic acids are NOT DETECTED.  The SARS-CoV-2 RNA is generally detectable in upper respiratory specimens during the acute phase of infection. The lowest concentration of SARS-CoV-2 viral copies this assay can detect is 138 copies/mL. A negative result does not preclude SARS-Cov-2 infection and should not be used as the sole basis for treatment or other patient management decisions. A negative result may occur with  improper specimen collection/handling, submission of specimen other than nasopharyngeal swab, presence of viral mutation(s) within the areas targeted by this assay, and inadequate number of viral copies(<138 copies/mL). A negative result must be combined with clinical observations, patient history, and epidemiological information. The expected result is Negative.  Fact Sheet for Patients:  EntrepreneurPulse.com.au  Fact Sheet for Healthcare Providers:  IncredibleEmployment.be  This test is no t yet approved or cleared by the [place] FDA and  has been authorized for detection and/or diagnosis of SARS-CoV-2 by FDA under an Emergency Use Authorization  (EUA). This EUA will remain  in effect (meaning this test can be used) for the duration of the COVID-19 declaration under Section 564(b)(1) of the Act, 21 U.S.C.section 360bbb-3(b)(1), unless the authorization is terminated  or revoked sooner.       Influenza A by PCR NEGATIVE NEGATIVE Final   Influenza B by PCR NEGATIVE NEGATIVE Final    Comment: (NOTE) The Xpert Xpress SARS-CoV-2/FLU/RSV plus assay is intended as an aid in the diagnosis of influenza from Nasopharyngeal swab specimens and should not be used as a sole basis for treatment. Nasal washings and aspirates are unacceptable for Xpert Xpress SARS-CoV-2/FLU/RSV testing.  Fact Sheet for Patients: EntrepreneurPulse.com.au  Fact Sheet for Healthcare Providers: IncredibleEmployment.be  This test is not yet approved or cleared by the [place] FDA and has been authorized for detection and/or diagnosis of SARS-CoV-2 by FDA under an Emergency Use Authorization (EUA). This EUA will remain in effect (meaning this test can be used) for the duration of the COVID-19 declaration under Section 564(b)(1) of the Act, 21 U.S.C. section 360bbb-3(b)(1), unless the authorization is terminated or revoked.  Performed at [facility], [address] [place], [place] [address]   Urine Culture     Status: Abnormal   Collection Time: 09/05/22  4:39 PM   Specimen: Urine, Clean Catch  Result Value Ref Range Status   Specimen Description URINE, CLEAN CATCH  Final   Special Requests NONE  Final   Culture (A)  Final    <10,000 COLONIES/mL INSIGNIFICANT GROWTH Performed at [facility], [place]. [address], [place], [place] [address]    Report Status 09/06/2022 FINAL  Final     Time coordinating discharge:  I have spent 35 minutes face to face with the patient and on the ward discussing the patients care, assessment, plan and disposition with other care givers. >50% of the time was devoted  counseling the patient about the risks and benefits of treatment/Discharge disposition and coordinating  care.   SIGNED:   Damita Lack, MD  [facility] 09/12/2022, 5:36 PM   If 7PM-7AM, please contact night-coverage

## 2022-09-12 NOTE — Evaluation (Signed)
Occupational Therapy Evaluation Patient Details Name: Briana Hartman MRN: [id number] DOB: May 03, 1934 Today's Date: 09/12/2022   History of Present Illness Pt is an 88 y/o female admitted secondary to aphasia. MRI negative. Recent admission for TIA. PMH inlcudes factor V leiden mutation with hx of PE, DM2, CAD with PCI (2022), HTN, HLD, CKD III, hypothyroidism, COPD.   Clinical Impression   Patient admitted for the diagnosis above.  Recent admission for similar event last week.  Patient lives with her son, and was participating with both [facility] and OT.  She would like to return home and continue those services.  OT will follow in the acute setting.       Recommendations for follow up therapy are one component of a multi-disciplinary discharge planning process, led by the attending physician.  Recommendations may be updated based on patient status, additional functional criteria and insurance authorization.   Follow Up Recommendations  Home health OT    Assistance Recommended at Discharge Intermittent Supervision/Assistance  Patient can return home with the following A little help with walking and/or transfers;A little help with bathing/dressing/bathroom;Assistance with cooking/housework;Direct supervision/assist for financial management;Direct supervision/assist for medications management;Help with stairs or ramp for entrance;Assist for transportation    Functional Status Assessment  Patient has had a recent decline in their functional status and demonstrates the ability to make significant improvements in function in a reasonable and predictable amount of time.  Equipment Recommendations  None recommended by OT    Recommendations for Other Services       Precautions / Restrictions Precautions Precautions: Fall Restrictions Weight Bearing Restrictions: No      Mobility Bed Mobility Overal bed mobility: Needs Assistance Bed Mobility: Supine to Sit     Supine to sit:  Supervision          Transfers Overall transfer level: Needs assistance Equipment used: Rolling walker (2 wheels) Transfers: Bed to chair/wheelchair/BSC, Sit to/from Stand Sit to Stand: Min guard     Step pivot transfers: Min guard            Balance Overall balance assessment: Needs assistance Sitting-balance support: Feet supported Sitting balance-Leahy Scale: Good     Standing balance support: Reliant on assistive device for balance Standing balance-Leahy Scale: Poor                             ADL either performed or assessed with clinical judgement   ADL       Grooming: Min guard;Standing           Upper Body Dressing : Supervision/safety;Sitting   Lower Body Dressing: Moderate assistance;Sit to/from stand   Toilet Transfer: Min guard;Rolling walker (2 wheels);Ambulation;Regular Toilet                   Vision Baseline Vision/History: 1 Wears glasses Patient Visual Report: No change from baseline Additional Comments: baseline diplopia looking up and to the R.  R eye tracks higher than L - baseline for 30 years.     Perception Perception Perception: Within Functional Limits   Praxis Praxis Praxis: Intact    Pertinent Vitals/Pain Pain Assessment Pain Assessment: No/denies pain     Hand Dominance Right   Extremity/Trunk Assessment Upper Extremity Assessment Upper Extremity Assessment: Overall WFL for tasks assessed   Lower Extremity Assessment Lower Extremity Assessment: Defer to PT evaluation   Cervical / Trunk Assessment Cervical / Trunk Assessment: Kyphotic   Communication Communication Communication: No difficulties  Cognition Arousal/Alertness: Awake/alert Behavior During Therapy: WFL for tasks assessed/performed Overall Cognitive Status: Within Functional Limits for tasks assessed                                       General Comments   VSS on RA and supplemental O2     Exercises      Shoulder Instructions      Home Living Family/patient expects to be discharged to:: Private residence Living Arrangements: Children Available Help at Discharge: Family Type of Home: House Home Access: Level entry     Home Layout: One level     Bathroom Shower/Tub: Occupational psychologist: Standard     Home Equipment: Tub bench;Rollator (4 wheels)   Additional Comments: Recently moved in with her son      Prior Functioning/Environment Prior Level of Function : Independent/Modified Independent             Mobility Comments: uses rollator ADLs Comments: does ADLs, was doing IADLs until she moved in with son (~3 weeks ago)        OT Problem List: Decreased range of motion;Decreased strength;Decreased activity tolerance;Impaired balance (sitting and/or standing);Decreased safety awareness;Decreased knowledge of use of DME or AE;Decreased cognition      OT Treatment/Interventions: Self-care/ADL training;DME and/or AE instruction;Therapeutic activities;Balance training;Patient/family education    OT Goals(Current goals can be found in the care plan section) Acute Rehab OT Goals Patient Stated Goal: Return home OT Goal Formulation: With patient Time For Goal Achievement: 10/03/22 Potential to Achieve Goals: Good ADL Goals Pt Will Perform Grooming: with modified independence;standing Pt Will Perform Lower Body Dressing: with modified independence;sit to/from stand Pt Will Transfer to Toilet: with modified independence;ambulating;regular height toilet  OT Frequency: Min 2X/week    Co-evaluation              AM-PAC OT "6 Clicks" Daily Activity     Outcome Measure Help from another person eating meals?: None Help from another person taking care of personal grooming?: A Little Help from another person toileting, which includes using toliet, bedpan, or urinal?: A Little Help from another person bathing (including washing, rinsing, drying)?: A Lot Help  from another person to put on and taking off regular upper body clothing?: None Help from another person to put on and taking off regular lower body clothing?: A Lot 6 Click Score: 18   End of Session Equipment Utilized During Treatment: Gait belt;Rolling walker (2 wheels) Nurse Communication: Mobility status  Activity Tolerance: Patient tolerated treatment well Patient left: in chair;with call bell/phone within reach;with family/visitor present  OT Visit Diagnosis: Unsteadiness on feet (R26.81);Other abnormalities of gait and mobility (R26.89);Muscle weakness (generalized) (M62.81);Other symptoms and signs involving cognitive function                Time: 4174-0814 OT Time Calculation (min): 21 min Charges:  OT General Charges $OT Visit: 1 Visit OT Evaluation $OT Eval Moderate Complexity: 1 Mod  09/12/2022  RP, OTR/L  Acute Rehabilitation Services  Office:  [phone]   Metta Clines 09/12/2022, 3:49 PM

## 2022-09-12 NOTE — Evaluation (Signed)
Physical Therapy Evaluation Patient Details Name: Briana Hartman MRN: [id number] DOB: October 17, 1934 Today's Date: 09/12/2022  History of Present Illness  Pt is an 88 y/o female admitted secondary to aphasia. MRI negative. Recent admission for TIA. PMH inlcudes factor V leiden mutation with hx of PE, DM2, CAD with PCI (2022), HTN, HLD, CKD III, hypothyroidism, COPD.  Clinical Impression  Pt admitted secondary to problem above with deficits below. Pt requiring Min guard to min A to stand and perform transfers to the [facility] in the ED. Did not have portable oxygen tank, so further mobility deferred.  Pt currently lives with her son and plan is to return home with him at d/c. Currently receiving HHPT and recommend resumption at d/c. Will continue to follow acutely.      Recommendations for follow up therapy are one component of a multi-disciplinary discharge planning process, led by the attending physician.  Recommendations may be updated based on patient status, additional functional criteria and insurance authorization.  Follow Up Recommendations Home health PT      Assistance Recommended at Discharge Intermittent Supervision/Assistance  Patient can return home with the following  A little help with walking and/or transfers;A little help with bathing/dressing/bathroom;Assistance with cooking/housework;Assist for transportation    Equipment Recommendations None recommended by PT  Recommendations for Other Services       Functional Status Assessment Patient has had a recent decline in their functional status and demonstrates the ability to make significant improvements in function in a reasonable and predictable amount of time.     Precautions / Restrictions Precautions Precautions: Fall Restrictions Weight Bearing Restrictions: No      Mobility  Bed Mobility Overal bed mobility: Needs Assistance Bed Mobility: Supine to Sit, Sit to Supine     Supine to sit: Min assist Sit to  supine: Min guard   General bed mobility comments: Min A for trunk elevation to come to sitting. Min guard for safety to return to supine on stretcher.    Transfers Overall transfer level: Needs assistance Equipment used: 1 person hand held assist Transfers: Bed to chair/wheelchair/BSC, Sit to/from Stand Sit to Stand: Min assist Stand pivot transfers: Min guard         General transfer comment: Min A for lift assist to stand and min guard for safety to transfer to/from [facility]. Holding to PT arm for support    Ambulation/Gait                  Stairs            Wheelchair Mobility    Modified Rankin (Stroke Patients Only)       Balance Overall balance assessment: Needs assistance Sitting-balance support: Feet supported Sitting balance-Leahy Scale: Good     Standing balance support: Single extremity supported, During functional activity Standing balance-Leahy Scale: Poor Standing balance comment: Reliant on at least 1 UE support                             Pertinent Vitals/Pain Pain Assessment Pain Assessment: No/denies pain    Home Living Family/patient expects to be discharged to:: Private residence Living Arrangements: Children Available Help at Discharge: Family Type of Home: House Home Access: Level entry       Home Layout: One level Home Equipment: Tub bench;Rollator (4 wheels) Additional Comments: Recently moved in with her son after her husband passed.    Prior Function Prior Level of Function : Independent/Modified Independent  Mobility Comments: uses rollator ADLs Comments: does ADLs, was doing IADLs until she moved in with son (~3 weeks ago)     Hand Dominance   Dominant Hand: Right    Extremity/Trunk Assessment   Upper Extremity Assessment Upper Extremity Assessment: Defer to OT evaluation    Lower Extremity Assessment Lower Extremity Assessment: Generalized weakness    Cervical / Trunk  Assessment Cervical / Trunk Assessment: Kyphotic  Communication   Communication: No difficulties  Cognition Arousal/Alertness: Awake/alert Behavior During Therapy: WFL for tasks assessed/performed Overall Cognitive Status: No family/caregiver present to determine baseline cognitive functioning                                 General Comments: Pleasant throughout. Following simple cues throughout.        General Comments      Exercises     Assessment/Plan    PT Assessment Patient needs continued PT services  PT Problem List Decreased strength;Decreased mobility;Decreased balance;Decreased knowledge of use of DME;Decreased safety awareness;Decreased activity tolerance;Cardiopulmonary status limiting activity       PT Treatment Interventions DME instruction;Therapeutic activities;Gait training;Therapeutic exercise;Patient/family education;Balance training;Stair training;Functional mobility training    PT Goals (Current goals can be found in the Care Plan section)  Acute Rehab PT Goals Patient Stated Goal: for episodes to stop happening PT Goal Formulation: With patient Time For Goal Achievement: 09/26/22 Potential to Achieve Goals: Good    Frequency Min 3X/week     Co-evaluation               AM-PAC PT "6 Clicks" Mobility  Outcome Measure Help needed turning from your back to your side while in a flat bed without using bedrails?: None Help needed moving from lying on your back to sitting on the side of a flat bed without using bedrails?: A Little Help needed moving to and from a bed to a chair (including a wheelchair)?: A Little Help needed standing up from a chair using your arms (e.g., wheelchair or bedside chair)?: A Little Help needed to walk in hospital room?: A Little Help needed climbing 3-5 steps with a railing? : A Little 6 Click Score: 19    End of Session Equipment Utilized During Treatment: Oxygen Activity Tolerance: Patient tolerated  treatment well Patient left: in bed;with call bell/phone within reach (on sstretcher in ED) Nurse Communication: Mobility status PT Visit Diagnosis: Unsteadiness on feet (R26.81);Muscle weakness (generalized) (M62.81)    Time: 8144-8185 PT Time Calculation (min) (ACUTE ONLY): 16 min   Charges:   PT Evaluation $PT Eval Low Complexity: 1 Low          Reuel Derby, PT, DPT  Acute Rehabilitation Services  Office: [phone]   Rudean Hitt 09/12/2022, 12:47 PM

## 2022-09-12 NOTE — Care Management Obs Status (Signed)
[place] NOTIFICATION   Patient Details  Name: Briana Hartman MRN: [id number] Date of Birth: March 14, 1934   Medicare Observation Status Notification Given:  Yes    Verdell Carmine, RN 09/12/2022, 2:56 PM

## 2022-09-12 NOTE — ED Notes (Signed)
Patient is now urinating, will continue to monitor

## 2022-09-12 NOTE — Progress Notes (Addendum)
STROKE TEAM PROGRESS NOTE   INTERVAL HISTORY Seen in room with family at the bedside. She lives with her son and daughter in law who manage her medications. She tells me that her husband died about a month ago and she is having a hard time coping. She was very upset both times these episodes happened and is in agreement that it may be partially due to her grief and increased stress and anxiety. She has not missed any of her eliquis dosing and she did not miss her coumadin dosing when she was on that. She does report occasional headaches. She dies headache currently. She states that she feels tired and weak right now from not sleeping but she denies focal neurological deficits at this time.   Vitals:   09/12/22 0500 09/12/22 0530 09/12/22 0615 09/12/22 0645  BP: (!) 106/49 (!) 121/57 (!) 141/61 (!) 171/78  Pulse: (!) 54 63 62 62  Resp: 15 16 18 16   Temp:      TempSrc:      SpO2: 97% 97% 96% 97%  Weight:      Height:       CBC:  Recent Labs  Lab 09/11/22 1616 09/11/22 1628  WBC 11.2*  --   NEUTROABS 6.8  --   HGB 13.0 13.9  HCT 41.7 41.0  MCV 91.2  --   PLT 310  --    Basic Metabolic Panel:  Recent Labs  Lab 09/07/22 0631 09/11/22 1616 09/11/22 1628  NA 140 138 138  K 3.8 4.0 4.0  CL 105 100 99  CO2 27 29  --   GLUCOSE 95 119* 113*  BUN 19 26* 29*  CREATININE 0.92 0.94 0.90  CALCIUM 8.7* 9.5  --    Lipid Panel:  Recent Labs  Lab 09/06/22 0744  CHOL 113  TRIG 120  HDL 30*  CHOLHDL 3.8  VLDL 24  LDLCALC 59   HgbA1c:  Recent Labs  Lab 09/06/22 0744  HGBA1C 6.5*   Urine Drug Screen: No results for input(s): "LABOPIA", "COCAINSCRNUR", "LABBENZ", "AMPHETMU", "THCU", "LABBARB" in the last 168 hours.  Alcohol Level  Recent Labs  Lab 09/11/22 1616  ETH <10    IMAGING past 24 hours MR BRAIN WO CONTRAST  Result Date: 09/12/2022 CLINICAL DATA:  Initial evaluation for acute TIA. EXAM: MRI HEAD WITHOUT CONTRAST TECHNIQUE: Multiplanar, multiecho pulse sequences  of the brain and surrounding structures were obtained without intravenous contrast. COMPARISON:  Prior CT from earlier the same day. FINDINGS: Brain: Cerebral volume within normal limits. Mild chronic microvascular ischemic disease. No evidence for acute or subacute ischemia. Gray-white matter differentiation maintained. No areas of chronic cortical infarction. No acute or chronic intracranial blood products. No mass lesion, midline shift or mass effect. No hydrocephalus or extra-axial fluid collection. Pituitary gland suprasellar region normal. Vascular: Major intracranial vascular flow voids are maintained. Skull and upper cervical spine: Craniocervical junction normal. Bone marrow signal intensity diffusely decreased on T1 weighted sequence, nonspecific, but most commonly related to anemia, smoking or obesity. No focal marrow replacing lesion. No scalp soft tissue abnormality. Sinuses/Orbits: Patient status post ocular lens replacement on the right. 9 mm FLAIR hyperintense lesion at the medial aspect of the right globe again noted, stable, and indeterminate. P mild mucosal thickening within the ethmoidal air cells. Paranasal sinuses are otherwise largely clear. No significant mastoid effusion. Other: Small retention cyst noted within the nasopharynx. IMPRESSION: 1. No acute intracranial abnormality. 2. Mild chronic microvascular ischemic disease. 3. 9 mm FLAIR hyperintense  lesion at the medial aspect of the right globe, stable, and indeterminate. Correlation with physical exam recommended. Electronically Signed   By: Jeannine Boga M.D.   On: 09/12/2022 00:28   CT HEAD CODE STROKE WO CONTRAST  Result Date: 09/11/2022 CLINICAL DATA:  Code stroke.  Speech and vision deficit EXAM: CT HEAD WITHOUT CONTRAST TECHNIQUE: Contiguous axial images were obtained from the base of the skull through the vertex without intravenous contrast. RADIATION DOSE REDUCTION: This exam was performed according to the departmental  dose-optimization program which includes automated exposure control, adjustment of the mA and/or kV according to patient size and/or use of iterative reconstruction technique. COMPARISON:  09/04/2022 FINDINGS: Evaluation is somewhat limited by motion artifact. Brain: No evidence of acute infarction, hemorrhage, cerebral edema, mass, mass effect, or midline shift. No hydrocephalus or extra-axial collection. Vascular: No hyperdense vessel. Atherosclerotic calcifications in the intracranial carotid and vertebral arteries. Skull: Hyperostosis frontalis. Negative for fracture or focal lesion. Sinuses/Orbits: No acute finding.  Right lens replacement. Other: The mastoid air cells are well aerated. ASPECTS [facility] Stroke Program Early CT Score) - Ganglionic level infarction (caudate, lentiform nuclei, internal capsule, insula, M1-M3 cortex): 7 - Supraganglionic infarction (M4-M6 cortex): 3 Total score (0-10 with 10 being normal): 10 IMPRESSION: 1. No acute intracranial process. 2. ASPECTS is 10 Code stroke imaging results were communicated on 09/11/2022 at 4:30 pm to provider Dr. Cheral Marker via secure text paging. Electronically Signed   By: Merilyn Baba M.D.   On: 09/11/2022 16:31    PHYSICAL EXAM  Physical Exam  Constitutional: Appears well-developed and well-nourished.   Cardiovascular: Normal rate and regular rhythm.  Respiratory: Effort normal, non-labored breathing  Neuro: Mental Status: Patient is awake, alert, oriented to person, place, month, year, and situation. Patient is able to give a clear and coherent history. No signs of aphasia or neglect Cranial Nerves: II: Visual Fields are full. PERRL asymmetric with L 45m, R 335m chronic  III,IV, VI: EOMI with dysconjugate gaze, right eye is deviated upward at rest and ptosis- also chronic V: Facial sensation is symmetric to temperature VII: Facial movement is symmetric resting and smiling VIII: Hearing is intact to voice X: Palate elevates  symmetrically XI: Shoulder shrug is symmetric. XII: Tongue protrudes midline without atrophy or fasciculations.  Motor: Tone is normal. Bulk is normal.  Generalized weakness with drift in bilateral lower extremities.  Sensory: Sensation is symmetric to light touch and temperature in the arms, diminished in lower legs- peripheral neuropathy. No extinction to DSS present.  Deep Tendon Reflexes: 2+ and symmetric in the biceps and patellae.  Plantars: Toes are downgoing bilaterally.  Cerebellar: FNF and HKS are intact bilaterally   ASSESSMENT/PLAN Ms. FrBARRY CULVERHOUSEs a [age].o. female with history of factor V Leiden mutation/PE on eliquis, DM II, HTN, HLD CKD stage IIIa CAD s/p PCI 2022, COPD and hypothyroidism presenting with acute onset of aphasia, weakness, left side headache while in the car with her son. She recently lost her husband and moved in with her son. She states that she has had a hard time with this life change and has been very upset and stressed. Aphasia improved in ED but she is still having generalized weakness. MRI is negative. Reports compliance with medications. ESR and CRP negative.   Likely complicated migraine / tension headache in the setting of grieving Pt had acute onset aphasia and confusion followed by severe HA 15 to 20 minutes later Speech resolved in 30 min and HA resolved overnight CT  no acute finding MRI no acute infarct CRP 1.1 ESR 20 Per son, pt husband passed away one month ago and pt has difficult time for grieving Last admission, pt also had slurry speech, right facial droop and chest tingling for 30 min associated with HA.  Pt has pressure HA and behind right eye from time to time at home Family will take her for grief counseling  Was on lexpro but d/c due to coumadin drug drug interaction, now in eliquis, will restart lexpro - discussed with Dr. Reesa Chew  Recent TIA Code Stroke CT head No acute abnormality. ASPECTS 10.    CTA head & neck   intracranial atherosclerosis including severe distal right M1 and moderate right A2 stenosis.  Cervical carotid artery atherosclerosis without significant stenosis MRI no acute infarct.  9 mm FLAIR hyperintense lesion at the medial aspect of the right globe, stable, and indeterminate. 2D Echo EF 60-65% LDL 59 HgbA1c 6.5 VTE prophylaxis - Eliquis Eliquis (apixaban) daily prior to admission, now on Eliquis (apixaban) daily.  Therapy recommendations:  HH Disposition: Likely home  Hypertension Home meds:  Norvasc, telmisartan, coreg Stable- Resume medications Long-term BP goal normotensive  Hyperlipidemia Home meds:  zetia, tricor, resumed in hospital LDL 59, goal < 70 History of statin intolerance Continue Zetia and Tricor at discharge  Diabetes type II Controlled Home meds:  metformin HgbA1c 6.5, goal < 7.0 CBGs SSI  Other Stroke Risk Factors Advanced Age >/= 38  Coronary artery disease Congestive heart failure Imdur  Other Active Problems Depression Taken off lexapro due to drug interaction with coumadin- consider resuming now that she is on eliquis Factor V leiden with PE Eliquis- reports compliance CKD Stage 3a Stable COPD stage 2 No exacerbation, resume medications Maintain O2 88-92% Hypothyroidism- resume synthroid  Hospital day # 0  Patient seen and examined by NP/APP with MD. MD to update note as needed.   Janine Ores, DNP, FNP-BC [facility] Pager: [phone]  ATTENDING NOTE: I reviewed above note and agree with the assessment and plan. Pt was seen and examined.   88 year old female with history of factor V Leyden deficiency and PE recently changed from Coumadin to Eliquis, hypertension, hyperlipidemia, diabetes, CKD 3A, CAD status post PCI 2022, recent TIA episode admitted for recurrent episode of speech difficulty, headache.  Symptoms resolved in 30 minutes.  With detailed questioning, patient had speech difficulty confusion followed by  severe headache 20 minutes later.  Difficulty and confusion last about 30 minutes, headache with it overnight.  Patient recently lost her husband 4 months ago and currently undergoing difficult time for grieving, admitted likely tension headache and pressure behind right eye from time to time.  Concerning for complicated migraine in the setting of emotional disturbance.  CT and MRI no acute abnormality. CRP 1.1, ESR within normal limits.  Creatinine 0.9, WBC 11.2, BP fluctuate.  Patient had recent admission 1 week ago for similar presentation but also with chest tingling at the time.  INR was low 1.6 on Coumadin, switched to Eliquis.  CT head and neck showed distal right M1 stenosis and right A2 stenosis, likely chronic.  EF 60 to 65%, LDL 59, A1c 6.5, UDS negative.  Found UTI and treated with Rocephin.    Patient was on Lexapro in the past but tapered off due to drug drug interaction with Coumadin.  Currently now on Eliquis, recommend to resume Lexapro for depression.  Discussed with family and Dr. Reesa Chew.  Continue Eliquis, fenofibrate and Zetia.   For detailed  assessment and plan, please refer to above/below as I have made changes wherever appropriate.   Neurology will sign off. Please call with questions. Pt will follow up with stroke clinic NP at [facility] in about 4 weeks. Thanks for the consult.   Rosalin Hawking, MD PhD Stroke Neurology 09/12/2022 7:05 PM    To contact Stroke Continuity provider, please refer to [email or web address] After hours, contact General Neurology

## 2022-09-12 NOTE — ED Notes (Signed)
Patient resting comfortably. No s/s of any distress. VSS. Will continue to monitor

## 2022-09-13 ENCOUNTER — Other Ambulatory Visit: Payer: Self-pay

## 2022-09-13 ENCOUNTER — Emergency Department (HOSPITAL_COMMUNITY): Payer: Medicare HMO

## 2022-09-13 ENCOUNTER — Observation Stay (HOSPITAL_COMMUNITY)
Admission: EM | Admit: 2022-09-13 | Discharge: 2022-09-14 | Disposition: A | Discharge status: Home / Self Care | Payer: Medicare HMO | Attending: Internal Medicine | Admitting: Internal Medicine

## 2022-09-13 ENCOUNTER — Encounter (HOSPITAL_COMMUNITY): Payer: Self-pay | Admitting: Emergency Medicine

## 2022-09-13 DIAGNOSIS — Z955 Presence of coronary angioplasty implant and graft: Secondary | ICD-10-CM | POA: Insufficient documentation

## 2022-09-13 DIAGNOSIS — E1122 Type 2 diabetes mellitus with diabetic chronic kidney disease: Secondary | ICD-10-CM | POA: Insufficient documentation

## 2022-09-13 DIAGNOSIS — Z86718 Personal history of other venous thrombosis and embolism: Secondary | ICD-10-CM | POA: Diagnosis not present

## 2022-09-13 DIAGNOSIS — R4701 Aphasia: Secondary | ICD-10-CM

## 2022-09-13 DIAGNOSIS — N1831 Chronic kidney disease, stage 3a: Secondary | ICD-10-CM | POA: Insufficient documentation

## 2022-09-13 DIAGNOSIS — Z7901 Long term (current) use of anticoagulants: Secondary | ICD-10-CM | POA: Diagnosis not present

## 2022-09-13 DIAGNOSIS — G43811 Other migraine, intractable, with status migrainosus: Secondary | ICD-10-CM

## 2022-09-13 DIAGNOSIS — J449 Chronic obstructive pulmonary disease, unspecified: Secondary | ICD-10-CM | POA: Insufficient documentation

## 2022-09-13 DIAGNOSIS — E039 Hypothyroidism, unspecified: Secondary | ICD-10-CM | POA: Insufficient documentation

## 2022-09-13 DIAGNOSIS — J45909 Unspecified asthma, uncomplicated: Secondary | ICD-10-CM | POA: Diagnosis not present

## 2022-09-13 DIAGNOSIS — Z85828 Personal history of other malignant neoplasm of skin: Secondary | ICD-10-CM | POA: Insufficient documentation

## 2022-09-13 DIAGNOSIS — R531 Weakness: Principal | ICD-10-CM

## 2022-09-13 DIAGNOSIS — R55 Syncope and collapse: Secondary | ICD-10-CM

## 2022-09-13 DIAGNOSIS — I129 Hypertensive chronic kidney disease with stage 1 through stage 4 chronic kidney disease, or unspecified chronic kidney disease: Secondary | ICD-10-CM | POA: Diagnosis not present

## 2022-09-13 DIAGNOSIS — Z79899 Other long term (current) drug therapy: Secondary | ICD-10-CM | POA: Diagnosis not present

## 2022-09-13 DIAGNOSIS — Z7984 Long term (current) use of oral hypoglycemic drugs: Secondary | ICD-10-CM | POA: Insufficient documentation

## 2022-09-13 LAB — CBC WITH DIFFERENTIAL/PLATELET
Abs Immature Granulocytes: 0.18 10*3/uL — ABNORMAL HIGH (ref 0.00–0.07)
Basophils Absolute: 0.1 10*3/uL (ref 0.0–0.1)
Basophils Relative: 1 %
Eosinophils Absolute: 0.2 10*3/uL (ref 0.0–0.5)
Eosinophils Relative: 2 %
HCT: 40.1 % (ref 36.0–46.0)
Hemoglobin: 12.7 g/dL (ref 12.0–15.0)
Immature Granulocytes: 2 %
Lymphocytes Relative: 24 %
Lymphs Abs: 2.6 10*3/uL (ref 0.7–4.0)
MCH: 28.7 pg (ref 26.0–34.0)
MCHC: 31.7 g/dL (ref 30.0–36.0)
MCV: 90.5 fL (ref 80.0–100.0)
Monocytes Absolute: 0.8 10*3/uL (ref 0.1–1.0)
Monocytes Relative: 7 %
Neutro Abs: 6.9 10*3/uL (ref 1.7–7.7)
Neutrophils Relative %: 64 %
Platelets: 290 10*3/uL (ref 150–400)
RBC: 4.43 MIL/uL (ref 3.87–5.11)
RDW: 14.3 % (ref 11.5–15.5)
WBC: 10.7 10*3/uL — ABNORMAL HIGH (ref 4.0–10.5)
nRBC: 0 % (ref 0.0–0.2)

## 2022-09-13 LAB — TROPONIN I (HIGH SENSITIVITY): Troponin I (High Sensitivity): 16 ng/L (ref <18)

## 2022-09-13 LAB — CBG MONITORING, ED: Glucose-Capillary: 149 mg/dL — ABNORMAL HIGH (ref 70–99)

## 2022-09-13 LAB — BASIC METABOLIC PANEL
Anion gap: 8 (ref 5–15)
Anion gap: 8 (ref 5–15)
BUN: 24 mg/dL — ABNORMAL HIGH (ref 8–23)
BUN: 22 mg/dL (ref 8–23)
CO2: 27 mmol/L (ref 22–32)
CO2: 28 mmol/L (ref 22–32)
Calcium: 9.2 mg/dL (ref 8.9–10.3)
Calcium: 9.1 mg/dL (ref 8.9–10.3)
Chloride: 102 mmol/L (ref 98–111)
Chloride: 103 mmol/L (ref 98–111)
Creatinine, Ser: 1.25 mg/dL — ABNORMAL HIGH (ref 0.44–1.00)
Creatinine, Ser: 1.06 mg/dL — ABNORMAL HIGH (ref 0.44–1.00)
GFR, Estimated: 42 mL/min — ABNORMAL LOW (ref >60)
GFR, Estimated: 51 mL/min — ABNORMAL LOW (ref >60)
Glucose, Bld: 111 mg/dL — ABNORMAL HIGH (ref 70–99)
Glucose, Bld: 107 mg/dL — ABNORMAL HIGH (ref 70–99)
Potassium: 6 mmol/L — ABNORMAL HIGH (ref 3.5–5.1)
Potassium: 4.1 mmol/L (ref 3.5–5.1)
Sodium: 137 mmol/L (ref 135–145)
Sodium: 139 mmol/L (ref 135–145)

## 2022-09-13 MED ORDER — TETRACAINE HCL 0.5 % OP SOLN
1.0000 [drp] | Freq: Once | OPHTHALMIC | Status: AC
  Administered 2022-09-13: 1 [drp] via OPHTHALMIC
  Filled 2022-09-13: qty 4

## 2022-09-13 MED ORDER — SODIUM CHLORIDE 0.9 % IV SOLN
500.0000 mg | Freq: Once | INTRAVENOUS | Status: AC
  Administered 2022-09-13: 500 mg via INTRAVENOUS
  Filled 2022-09-13: qty 4

## 2022-09-13 MED ORDER — FENTANYL CITRATE PF 50 MCG/ML IJ SOSY
25.0000 ug | PREFILLED_SYRINGE | Freq: Once | INTRAMUSCULAR | Status: AC
  Administered 2022-09-13: 25 ug via INTRAVENOUS
  Filled 2022-09-13: qty 1

## 2022-09-13 MED ORDER — METOCLOPRAMIDE HCL 5 MG/ML IJ SOLN
10.0000 mg | Freq: Once | INTRAMUSCULAR | Status: AC
  Administered 2022-09-13: 10 mg via INTRAVENOUS
  Filled 2022-09-13: qty 2

## 2022-09-13 MED ORDER — DIPHENHYDRAMINE HCL 50 MG/ML IJ SOLN
12.5000 mg | Freq: Once | INTRAMUSCULAR | Status: AC
  Administered 2022-09-13: 12.5 mg via INTRAVENOUS
  Filled 2022-09-13: qty 1

## 2022-09-13 MED ORDER — FENTANYL CITRATE PF 50 MCG/ML IJ SOSY
50.0000 ug | PREFILLED_SYRINGE | Freq: Once | INTRAMUSCULAR | Status: AC
  Administered 2022-09-13: 50 ug via INTRAVENOUS
  Filled 2022-09-13: qty 1

## 2022-09-13 MED ORDER — SODIUM CHLORIDE 0.9 % IV BOLUS
500.0000 mL | Freq: Once | INTRAVENOUS | Status: AC
  Administered 2022-09-13: 500 mL via INTRAVENOUS

## 2022-09-13 MED ORDER — MAGNESIUM SULFATE 2 GM/50ML IV SOLN
2.0000 g | Freq: Once | INTRAVENOUS | Status: AC
  Administered 2022-09-13: 2 g via INTRAVENOUS
  Filled 2022-09-13: qty 50

## 2022-09-13 NOTE — ED Provider Notes (Signed)
3:57 PM Patient signed out to me by previous ED physician. Pt is a 88 yo female presenting for headache, vision changes, and syncope. Left sided minor weakness on PE. Was seen recently twice for weakness, numbness, speech changes.   Reglan, mag, fentanyl given for HA. Stable CT Head MRI pending  Plan: F/u MRI and re-eval headache status.   Physical Exam  BP (!) 187/82   Pulse 71   Temp 97.6 F (36.4 C) (Oral)   Resp (!) 22   Ht 5' 6" (1.676 m)   Wt 82.6 kg   LMP  (LMP Unknown)   SpO2 92%   BMI 29.39 kg/m   Physical Exam  Procedures  Procedures  ED Course / MDM    Medical Decision Making Amount and/or Complexity of Data Reviewed Labs: ordered. Radiology: ordered.  Risk Prescription drug management.   Patient is awake and alert on exam.  Patient denies any improvement of headache after Reglan, fentanyl, and magnesium given by previous provider.  Patient remains neurovascular intact with no difficulty with word finding or slurred speech at this time.  MRI demonstrates no strokelike findings.  Patient does have a dacryocystoele.  Patient has now been seen 3 times in the past month for these similar symptoms.  Symptoms likely complex migraine with neurological symptoms.  I spoke with on-call neurologist Dr. Aurora who has previously met this patient.  I added IV fluids and repeat pain control.  Also added Reglan and Benadryl.  He recommends attempting further migraine control with Solu-Medrol.  Neurology recommends patient be admitted for EEG otherwise discharged home with Topamax.   Syncopize wise, patient has not syncopized since arrival to the emergency department.  EKG demonstrates normal sinus rhythm.  Patient was recently admitted and had a full cardiac work-up including a normal echocardiogram.  Stable electrolytes.  No further interventions for syncope at this time. Pt recommended for obs. Accepted by Dr. Hall.         Gray, Alicia P, DO 09/13/22 2347  

## 2022-09-13 NOTE — Consult Note (Signed)
Neurology Consultation  Reason for Consult: Intractable headache, left-sided numbness and weakness Referring Physician: Dr. Pearline Cables, ER  CC: Intractable headache, left-sided numbness and weakness  History is obtained from: Patient, chart, patient's family at bedside  HPI: GENEVE KIMPEL is a 88 y.o. female past medical history of factor V Leiden mutation/PE, diabetes, hypertension and hyperlipidemia, CKD stage IIIa, coronary artery disease status post PCI, COPD, hypothyroidism, currently on Eliquis, recently seen by neurology in the past couple of weeks for speech related deficits and left-sided headache along with weakness is, received a full stroke/TIA work-up with symptoms likely concerning for complex migraine as well as related to grief on the loss of her husband recently, presented back to the ER with complaints of left-sided headache, left-sided numbness and an episode of unresponsiveness. Unclear last known well. EMS was called due to sudden onset of headache as well as dizziness and nausea along with left arm and leg numbness.  Family also reported that her speech had become slurred.  They have an audio recording where they are trying to talk to the patient and her speech appears somewhat slurred and garbled.  She complains of a severe headache on the left head that has now spread to the whole head.  She has tenderness to palpation all over her head.  Work-up for giant cell arteritis-ESR was normal during last admission.  Does report some wavy lines when she has headaches more so in the right eye.  Also has She also complains of nausea.  She denies any chest pain.  Reports significant left-sided sensorimotor deficits at this time.  In the ER, received migraine cocktail.  No relief of headache.  ED provider called for further recommendations on headache medications as well as preventatives.  Oncological examination February 2023-reports cataracts, epiretinal membrane in the right eye,  hypertropia of the right eye, keratoconjunctivitis sicca, and ptosis of the right upper eyelid along with presbyopia.  She reports her headache and visual symptoms starting and originating more so from the right eye than the left.   LKW: Unclear IV thrombolysis given?: no, unclear last known well, on Eliquis Premorbid modified Rankin scale (mRS): 3-documented in the prior note as well  ROS: Full ROS was performed and is negative except as noted in the HPI.   Past Medical History:  Diagnosis Date   ACID REFLUX DISEASE    Adenomatous colon polyp    Allergy    Anemia    Anxiety    Arthritis    Asthma    Blood transfusion without reported diagnosis    Cataract    Cervical radiculopathy    COPD (chronic obstructive pulmonary disease) ([place])    Diabetes mellitus    Enlarged heart    born with   Fundic gland polyps of stomach, benign 03/06/2015   Hyperlipidemia    Hyperplastic polyp of stomach 03/06/2015   HYPERTENSION    HYPOTHYROIDISM    Irritable bowel syndrome    Long term current use of anticoagulant    Lumbar spinal stenosis    MITRAL VALVE PROLAPSE    Peripheral neuropathy    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 She has never used smokeless tobacco. She reports that she does not drink alcohol and does not use drugs.  Medications  Current Facility-Administered Medications:    methylPREDNISolone sodium succinate (SOLU-MEDROL) 500 mg in sodium chloride 0.9 % 50 mL IVPB, 500 mg, Intravenous, Once, Lianne Cure, DO  Current Outpatient Medications:    albuterol (VENTOLIN HFA) 108 (90 Base) MCG/ACT inhaler, Inhale 1 puff into the lungs every 6 (six) hours as needed for shortness of breath., Disp: , Rfl:    Alcohol Swabs (B-D SINGLE USE SWABS REGULAR) PADS, , Disp: , Rfl:    amLODipine (NORVASC) 10 MG tablet, Take 10 mg by mouth daily., Disp: , Rfl:    apixaban (ELIQUIS) 5 MG TABS tablet, Take 1 tablet (5 mg total) by mouth 2 (two) times daily. Kindly provide 1 month supply with the free 1 month coupon, future supplies per PCP, Disp: 60 tablet, Rfl: 0   Blood Glucose Monitoring Suppl (TRUE METRIX AIR GLUCOSE METER) w/Device KIT, , Disp: , Rfl:    BREO ELLIPTA 100-25 MCG/ACT AEPB, Inhale 1 puff into the lungs daily., Disp: , Rfl:    carvedilol (COREG) 6.25 MG tablet, Take 1 tablet (6.25 mg total) by mouth 2 (two) times daily., Disp: 60 tablet, Rfl: 0   Elastic Bandages & Supports (ABDOMINAL BINDER/ELASTIC XL) MISC, 1 Device by Does not apply route as needed (to prevent abdominal pain from hernia)., Disp: 1 each, Rfl: 0   ezetimibe (ZETIA) 10 MG tablet, Take 1 tablet (10 mg total) by mouth daily., Disp: 30 tablet, Rfl: 0   fenofibrate (TRICOR) 145 MG tablet, Take 1 tablet (145 mg total) by mouth daily., Disp: 90 tablet, Rfl: 3   fluticasone (FLOVENT HFA) 220 MCG/ACT inhaler, Inhale 1 puff into the lungs daily as needed (For shortness of breath)., Disp: , Rfl:    furosemide (LASIX) 40 MG tablet, Take 1 tablet (40 mg total) by mouth 2 (two) times daily. (Patient taking differently: Take 40 mg by mouth daily.), Disp: 60 tablet, Rfl: 11   gabapentin (NEURONTIN) 300 MG capsule, Take 300 mg by mouth 3 (three) times daily., Disp: , Rfl:    glucose blood (TRUE METRIX BLOOD GLUCOSE TEST) test strip, TEST TWO TIMES DAILY, Disp: , Rfl:    isosorbide mononitrate (IMDUR) 60 MG 24 hr tablet, Take 1 tablet (60 mg total) by mouth daily., Disp: 30 tablet, Rfl: 0   levothyroxine (SYNTHROID) 50 MCG tablet, Take 50 mcg by mouth  daily before breakfast., Disp: , Rfl:    metFORMIN (GLUCOPHAGE) 500 MG tablet, Take 500 mg by mouth daily with breakfast., Disp: , Rfl:    nitroGLYCERIN (NITROSTAT) 0.4 MG SL tablet, Place 1 tablet (0.4 mg total) under the tongue every 5 (five) minutes as needed. (Patient taking differently: Place 0.4 mg under the tongue every 5 (five) minutes as needed for chest pain.), Disp: 75 tablet, Rfl: 3   omeprazole (PRILOSEC) 20 MG capsule, Take 20 mg by mouth daily., Disp: , Rfl:    telmisartan (MICARDIS) 40 MG tablet, Take 1 tablet (40 mg total) by mouth daily., Disp: 30 tablet, Rfl: 6   TRUEplus Lancets 33G  MISC, , Disp: , Rfl:   Exam: Current vital signs: BP (!) 136/57   Pulse 70   Temp 98 F (36.7 C) (Oral)   Resp (!) 24   Ht _0  (1.676 m)   Wt 82.6 kg   LMP  (LMP Unknown)   SpO2 100%   BMI 29.39 kg/m  Vital signs in last 24 hours: Temp:  [97.6 F (36.4 C)-98 F (36.7 C)] 98 F (36.7 C) (09/23 1707) Pulse Rate:  [63-76] 70 (09/23 2200) Resp:  [16-24] 24 (09/23 2200) BP: (120-187)/(57-86) 136/57 (09/23 2200) SpO2:  [90 %-100 %] 100 % (09/23 2200) Weight:  [82.6 kg] 82.6 kg (09/23 1323) General: Awake alert, distressed looking due to headache HEENT: Normocephalic, atraumatic, palpable tenderness all over the head to light palpation.  Intact temporal pulses CVS: Regular rhythm Respiratory: Requiring supplemental oxygen due to desaturation while sleeping after receiving migraine cocktail. Abdomen nondistended nontender Neurological exam She is awake alert distress due to headache Mildly dysarthric No aphasia Cranial nerves II to XII: Pupils are asymmetric with right pupil 2 mm reactive, left pupil 3 mm reactive, mild right ptosis, mild right exotropia, intact extraocular movements, intact visual fields, diminished facial sensation to vibration on the right forehead in comparison to the left, symmetric facies, diminished auditory acuity bilaterally, midline tongue and palate. Motor  examination with effort dependent left-sided weakness. Sensation diminished in a stocking type pattern up to the knees bilaterally and in a glove like pattern on the hand. Coordination with no dysmetria  Labs I have reviewed labs in epic and the results pertinent to this consultation are:  CBC    Component Value Date/Time   WBC 10.7 (H) 09/13/2022 1332   RBC 4.43 09/13/2022 1332   HGB 12.7 09/13/2022 1332   HGB 13.4 01/26/2020 1621   HCT 40.1 09/13/2022 1332   HCT 40.1 01/26/2020 1621   PLT 290 09/13/2022 1332   PLT 295 01/26/2020 1621   MCV 90.5 09/13/2022 1332   MCV 83 01/26/2020 1621   MCH 28.7 09/13/2022 1332   MCHC 31.7 09/13/2022 1332   RDW 14.3 09/13/2022 1332   RDW 14.1 01/26/2020 1621   LYMPHSABS 2.6 09/13/2022 1332   MONOABS 0.8 09/13/2022 1332   EOSABS 0.2 09/13/2022 1332   BASOSABS 0.1 09/13/2022 1332    CMP     Component Value Date/Time   NA 137 09/13/2022 1332   NA 144 01/26/2020 1621   K 6.0 (H) 09/13/2022 1332   CL 102 09/13/2022 1332   CO2 27 09/13/2022 1332   GLUCOSE 111 (H) 09/13/2022 1332   BUN 24 (H) 09/13/2022 1332   BUN 12 01/26/2020 1621   CREATININE 1.25 (H) 09/13/2022 1332   CALCIUM 9.2 09/13/2022 1332   PROT 6.3 (L) 09/11/2022 1616   PROT 6.5 01/26/2020 1621   ALBUMIN 3.3 (L) 09/11/2022 1616   ALBUMIN 4.3 01/26/2020 1621   AST 17 09/11/2022 1616   ALT 12 09/11/2022 1616   ALKPHOS 37 (L) 09/11/2022 1616   BILITOT 0.4 09/11/2022 1616   BILITOT <0.2 01/26/2020 1621   GFRNONAA 42 (L) 09/13/2022 1332   GFRAA 64 01/26/2020 1621    Lipid Panel     Component Value Date/Time   CHOL 113 09/06/2022 0744   CHOL 223 (H) 01/26/2020 1621   TRIG 120 09/06/2022 0744   HDL 30 (L) 09/06/2022 0744   HDL 33 (L) 01/26/2020 1621   CHOLHDL 3.8 09/06/2022 0744   VLDL 24 09/06/2022 0744   LDLCALC 59 09/06/2022  [place] 93 01/26/2020 1621   LDLDIRECT 113.4 05/31/2013 1425     Imaging I have reviewed the images obtained: MRI of the brain  from 09/05/2022, 09/11/2022 and 09/13/2022-no acute changes.  No stroke.  Stable atrophy and white matter disease.  Lesion superior medial to the right lobe likely represent a dacryocystocele  Assessment:  88 year old woman with above past medical history presenting for the third time in the past couple of weeks with headaches and left-sided numbness and weakness. On examination the weakness symptoms are somewhat effort dependent.  The sensory symptoms also have a functional component due to splitting of vibration on the forehead. She has palpable tenderness all over her head. Her symptoms are not resolved with the preliminary migraine cocktail. I am not sure if this truly reflects a complex migraine or she has an underlying psychogenic etiology for these headaches.  That said, I would treat the headaches and send her out on Topamax for prevention for outpatient neurology and ophthalmology follow-up at this time. She had the episode of syncope due to distraught by the family-her echocardiogram is unremarkable.  She has had cardiac monitoring.  The only other thing I can think of of pursuing is an EEG which can be done in the morning.  Impression -?Intractable headache-question complex migraine versus psychogenic component to the headache -Less likely giant cell arteritis-ESR was stone cold normal. -Imaging negative for bleed or mass -Left-sided numbness and weakness symptoms-some giveaway weakness as well as multiple negative MRIs within the last couple of weeks raising suspicion for an underlying psychogenic etiology -Episode of syncope-negative echo and cardiac monitoring on previous admissions.  Can consider EEG for further work-up  Recommendations: Can admit to Obs since EEG likely will not happen till tomorrow and she is still symptomatic. For acute headache treatment, I would recommend high-dose Solu-Medrol-500 mg IV x1.  If this does not resolve the headache, I would try valproate 500 mg IV  x1. For headache prevention, at discharge I would recommend that we send her home on Topamax.  Dosage to start 25 mg at night for 1 week followed by escalations by 25 mg every week until a 50 mg twice daily dosing is reached. I would recommend an EEG to complete syncope work-up. Follow-up with outpatient neurology and ophthalmology-she has those both scheduled for next week. Plan discussed with Dr. Pearline Cables.   -- Amie Portland, MD Neurologist [facility] Pager: [phone]

## 2022-09-13 NOTE — ED Notes (Signed)
Patient transported to MRI 

## 2022-09-13 NOTE — ED Notes (Signed)
Pt given crackers, applesauce and juice.  Pt states her head pain is now 7/10

## 2022-09-13 NOTE — ED Triage Notes (Signed)
Pt BIB [facility] from home due to hypotension of 60/40.  Pt reports blurred vision.  Pt family told EMS of pt showing signs of dysphagia and near syncope.  Pt reports headache.  Hx of migraines.  VS BP 148/68, HR 68, SpO2 95%, CBG 114.  20g left AC.

## 2022-09-13 NOTE — ED Provider Notes (Signed)
[facility] EMERGENCY DEPARTMENT Provider Note   CSN: [id number] Arrival date & time: 09/13/22  1311     History  Chief Complaint  Patient presents with   Hypotension    PIER LAUX is a 88 y.o. female.  Pt is an 88y/o female with hx of factor V leiden mutation with hx of PE currently transitioned from coumadin to eliquis due to subtherapeutic INRs and now 2 visits this month for possible TIA, T2DM, CAD with PCI in 2022, HTN, HLD, CKD stage 3a, hypothyroidism, COPD, GAD who is presenting today with EMS for sudden onset of headache as well as dizziness, nausea and left-sided arm and leg numbness and weakness.  Family reported some slurred speech at home and when they checked her blood pressure it was low however EMS reports when they arrived patient's speech was normal and she has had no readings of low blood pressure.  Patient continues to complain of severe headache in the front and back of her head and reports it started suddenly but prior to starting she had spots in waves in her vision.  She still complains of some spots in waves in her visual field.  She denies any chest pain, shortness of breath, back pain or abdominal pain.  She is complaining of nausea.  She reports her left arm and leg feels numb and tingling.  She denies any falls.  The history is provided by the patient, the EMS personnel, a relative and medical records.       Home Medications Prior to Admission medications   Medication Sig Start Date End Date Taking? Authorizing Provider  albuterol (VENTOLIN HFA) 108 (90 Base) MCG/ACT inhaler Inhale 1 puff into the lungs every 6 (six) hours as needed for shortness of breath. 12/08/19   [provider]  Alcohol Swabs (B-D SINGLE USE SWABS REGULAR) PADS  11/15/19   [provider]  amLODipine (NORVASC) 10 MG tablet Take 10 mg by mouth daily. 01/18/20   [provider]  apixaban (ELIQUIS) 5 MG TABS tablet Take 1 tablet (5 mg  total) by mouth 2 (two) times daily. Kindly provide 1 month supply with the free 1 month coupon, future supplies per PCP 09/07/22   Thurnell Lose, MD  Blood Glucose Monitoring Suppl (TRUE METRIX AIR GLUCOSE METER) w/Device KIT  07/04/19   [provider]  BREO ELLIPTA 100-25 MCG/ACT AEPB Inhale 1 puff into the lungs daily. 08/29/22   [provider]  carvedilol (COREG) 6.25 MG tablet Take 1 tablet (6.25 mg total) by mouth 2 (two) times daily. 09/07/22 09/07/23  Thurnell Lose, MD  Elastic Bandages & Supports (ABDOMINAL BINDER/ELASTIC XL) MISC 1 Device by Does not apply route as needed (to prevent abdominal pain from hernia). 03/02/15   Gatha Mayer, MD  ezetimibe (ZETIA) 10 MG tablet Take 1 tablet (10 mg total) by mouth daily. 09/07/22   Thurnell Lose, MD  fenofibrate (TRICOR) 145 MG tablet Take 1 tablet (145 mg total) by mouth daily. 10/03/21   Richardo Priest, MD  fluticasone (FLOVENT HFA) 220 MCG/ACT inhaler Inhale 1 puff into the lungs daily as needed (For shortness of breath). 10/26/18   [provider]  furosemide (LASIX) 40 MG tablet Take 1 tablet (40 mg total) by mouth 2 (two) times daily. Patient taking differently: Take 40 mg by mouth daily. 01/10/14   Biagio Borg, MD  gabapentin (NEURONTIN) 300 MG capsule Take 300 mg by mouth 3 (three) times daily. 01/03/20  [provider]  glucose blood (TRUE METRIX BLOOD GLUCOSE TEST) test strip TEST TWO TIMES DAILY 06/23/19   [provider]  isosorbide mononitrate (IMDUR) 60 MG 24 hr tablet Take 1 tablet (60 mg total) by mouth daily. 09/07/22   Thurnell Lose, MD  levothyroxine (SYNTHROID) 50 MCG tablet Take 50 mcg by mouth daily before breakfast.    [provider]  metFORMIN (GLUCOPHAGE) 500 MG tablet Take 500 mg by mouth daily with breakfast. 01/18/20   [provider]  nitroGLYCERIN (NITROSTAT) 0.4 MG SL tablet Place 1 tablet (0.4 mg total) under the tongue every 5 (five) minutes  as needed. Patient taking differently: Place 0.4 mg under the tongue every 5 (five) minutes as needed for chest pain. 03/15/20   Richardo Priest, MD  omeprazole (PRILOSEC) 20 MG capsule Take 20 mg by mouth daily. 12/26/19   [provider]  telmisartan (MICARDIS) 40 MG tablet Take 1 tablet (40 mg total) by mouth daily. 02/21/21   Richardo Priest, MD  TRUEplus Lancets 33G MISC  11/15/19   [provider]      Allergies    Aspirin, Caffeine, Codeine, Hydroxyzine, Lipitor [atorvastatin], Ampicillin, Ketoconazole, and Penicillins    Review of Systems   Review of Systems  Physical Exam Updated Vital Signs BP (!) 167/70   Pulse 63   Temp 97.6 F (36.4 C) (Oral)   Resp (!) 23   Ht _0  (1.676 m)   Wt 82.6 kg   LMP  (LMP Unknown)   SpO2 96%   BMI 29.39 kg/m  Physical Exam Vitals and nursing note reviewed.  Constitutional:      General: She is not in acute distress.    Appearance: She is well-developed.     Comments: Appears uncomfortable  HENT:     Head: Normocephalic and atraumatic.  Eyes:     Extraocular Movements: Extraocular movements intact.     Pupils: Pupils are equal, round, and reactive to light.     Comments: Photophobic  Cardiovascular:     Rate and Rhythm: Normal rate and regular rhythm.     Pulses: Normal pulses.     Heart sounds: Normal heart sounds. No murmur heard.    No friction rub.  Pulmonary:     Effort: Pulmonary effort is normal.     Breath sounds: Normal breath sounds. No wheezing or rales.  Abdominal:     General: Bowel sounds are normal. There is no distension.     Palpations: Abdomen is soft.     Tenderness: There is no abdominal tenderness. There is no guarding or rebound.  Musculoskeletal:        General: No tenderness. Normal range of motion.     Cervical back: Normal range of motion and neck supple.     Comments: No edema  Skin:    General: Skin is warm and dry.     Findings: No rash.  Neurological:     Mental Status: She  is alert and oriented to person, place, and time.     Cranial Nerves: No cranial nerve deficit.     Comments: 4 out of 5 strength noted in the left upper and lower extremity.  No notable pronator drift.  5 out of 5 strength present in the right upper and lower extremity.  No visual Field cuts noted.  Normal speech.  Psychiatric:     Comments: anxious     ED Results / Procedures / Treatments   Labs (all  labs ordered are listed, but only abnormal results are displayed) Labs Reviewed  CBC WITH DIFFERENTIAL/PLATELET - Abnormal; Notable for the following components:      Result Value   WBC 10.7 (*)    Abs Immature Granulocytes 0.18 (*)    All other components within normal limits  BASIC METABOLIC PANEL - Abnormal; Notable for the following components:   Potassium 6.0 (*)    Glucose, Bld 111 (*)    BUN 24 (*)    Creatinine, Ser 1.25 (*)    GFR, Estimated 42 (*)    All other components within normal limits    EKG None  Radiology CT Head Wo Contrast  Result Date: 09/13/2022 CLINICAL DATA:  Headache, hypertension, blurred vision, dysphagia in near syncope. History of migraine headaches. EXAM: CT HEAD WITHOUT CONTRAST TECHNIQUE: Contiguous axial images were obtained from the base of the skull through the vertex without intravenous contrast. RADIATION DOSE REDUCTION: This exam was performed according to the departmental dose-optimization program which includes automated exposure control, adjustment of the mA and/or kV according to patient size and/or use of iterative reconstruction technique. COMPARISON:  Head CT and brain MR dated 09/11/2022 FINDINGS: Brain: Stable mildly enlarged ventricles and cortical sulci. Stable mild patchy white matter low density in both cerebral hemispheres. No intracranial hemorrhage, mass lesion or CT evidence of acute infarction. Vascular: No hyperdense vessel or unexpected calcification. Skull: Stable bilateral hyperostosis frontalis. Sinuses/Orbits: Stable small  oval mass at the medial aspect of the upper right globe. Unremarkable paranasal sinuses. Other: Bilateral concha bullosa, greater on the right. IMPRESSION: 1. No acute abnormality. 2. Stable mild atrophy and mild chronic small-vessel white matter ischemic changes. 3. Stable small, oval mass at the medial aspect of the upper right globe. Electronically Signed   By: Claudie Revering M.D.   On: 09/13/2022 14:26   MR BRAIN WO CONTRAST  Result Date: 09/12/2022 CLINICAL DATA:  Initial evaluation for acute TIA. EXAM: MRI HEAD WITHOUT CONTRAST TECHNIQUE: Multiplanar, multiecho pulse sequences of the brain and surrounding structures were obtained without intravenous contrast. COMPARISON:  Prior CT from earlier the same day. FINDINGS: Brain: Cerebral volume within normal limits. Mild chronic microvascular ischemic disease. No evidence for acute or subacute ischemia. Gray-white matter differentiation maintained. No areas of chronic cortical infarction. No acute or chronic intracranial blood products. No mass lesion, midline shift or mass effect. No hydrocephalus or extra-axial fluid collection. Pituitary gland suprasellar region normal. Vascular: Major intracranial vascular flow voids are maintained. Skull and upper cervical spine: Craniocervical junction normal. Bone marrow signal intensity diffusely decreased on T1 weighted sequence, nonspecific, but most commonly related to anemia, smoking or obesity. No focal marrow replacing lesion. No scalp soft tissue abnormality. Sinuses/Orbits: Patient status post ocular lens replacement on the right. 9 mm FLAIR hyperintense lesion at the medial aspect of the right globe again noted, stable, and indeterminate. P mild mucosal thickening within the ethmoidal air cells. Paranasal sinuses are otherwise largely clear. No significant mastoid effusion. Other: Small retention cyst noted within the nasopharynx. IMPRESSION: 1. No acute intracranial abnormality. 2. Mild chronic microvascular  ischemic disease. 3. 9 mm FLAIR hyperintense lesion at the medial aspect of the right globe, stable, and indeterminate. Correlation with physical exam recommended. Electronically Signed   By: Jeannine Boga M.D.   On: 09/12/2022 00:28   CT HEAD CODE STROKE WO CONTRAST  Result Date: 09/11/2022 CLINICAL DATA:  Code stroke.  Speech and vision deficit EXAM: CT HEAD WITHOUT CONTRAST TECHNIQUE: Contiguous axial images were obtained  from the base of the skull through the vertex without intravenous contrast. RADIATION DOSE REDUCTION: This exam was performed according to the departmental dose-optimization program which includes automated exposure control, adjustment of the mA and/or kV according to patient size and/or use of iterative reconstruction technique. COMPARISON:  09/04/2022 FINDINGS: Evaluation is somewhat limited by motion artifact. Brain: No evidence of acute infarction, hemorrhage, cerebral edema, mass, mass effect, or midline shift. No hydrocephalus or extra-axial collection. Vascular: No hyperdense vessel. Atherosclerotic calcifications in the intracranial carotid and vertebral arteries. Skull: Hyperostosis frontalis. Negative for fracture or focal lesion. Sinuses/Orbits: No acute finding.  Right lens replacement. Other: The mastoid air cells are well aerated. ASPECTS [facility] Stroke Program Early CT Score) - Ganglionic level infarction (caudate, lentiform nuclei, internal capsule, insula, M1-M3 cortex): 7 - Supraganglionic infarction (M4-M6 cortex): 3 Total score (0-10 with 10 being normal): 10 IMPRESSION: 1. No acute intracranial process. 2. ASPECTS is 10 Code stroke imaging results were communicated on 09/11/2022 at 4:30 pm to provider Dr. Cheral Marker via secure text paging. Electronically Signed   By: Merilyn Baba M.D.   On: 09/11/2022 16:31    Procedures Procedures    Medications Ordered in ED Medications  magnesium sulfate IVPB 2 g 50 mL (has no administration in time range)  fentaNYL  (SUBLIMAZE) injection 25 mcg (has no administration in time range)  metoCLOPramide (REGLAN) injection 10 mg (10 mg Intravenous Given 09/13/22 1333)    ED Course/ Medical Decision Making/ A&P                           Medical Decision Making Amount and/or Complexity of Data Reviewed External Data Reviewed: notes.    Details: Recent hospitalizations Labs: ordered. Decision-making details documented in ED Course. Radiology: ordered and independent interpretation performed. Decision-making details documented in ED Course.  Risk Prescription drug management.   Pt with multiple medical problems and comorbidities and presenting today with a complaint that caries a high risk for morbidity and mortality.  With recent admission for TIA work-up x2 this month.  Patient had no findings of stroke.  She had an MRI yesterday that showed no acute findings.  Patient was recently transitioned to Eliquis due to subtherapeutic INRs.  Patient today developed a sudden headache but seemed to have possible aura before her symptoms started.  Based on family report may have had a near syncopal event but has been hemodynamically stable since EMS has been present.  Patient is still complaining of severe headache.  Concern for migraine versus subarachnoid hemorrhage.  Patient is noted to have some mild weakness in the left upper and lower extremity and she was last normal at noon.  Will discuss with neurology.  We will do a head CT to ensure no evidence of bleed given she was recently transitioned to Eliquis.  Patient given Reglan for headache.  2:56 PM Patient received meds and is still having significant headache. I have independently visualized and interpreted pt's images today.  CT shows no acute findings for bleeding.  Radiology reports that she has stable chronic small vessel disease.  I independently interpreted patient's labs and BMP shows a potassium of 6.0 however mom most likely related to hemolysis as her  creatinine is normal, CBC without acute findings other than a white count of 10.7.  Discussed the patient with neurology and discussed prior testing.  They recommended repeat MRI even though she had had an MRI yesterday given her new symptoms.  If  this is negative they recommend an outpatient work-up.  We will give patient on medication for patient's headache.  Findings discussed with the patient and her family members.  Patient will be checked out to Dr. Billy Fischer          Final Clinical Impression(s) / ED Diagnoses Final diagnoses:  None    Rx / DC Orders ED Discharge Orders     None         Blanchie Dessert, MD 09/13/22 1458

## 2022-09-13 NOTE — H&P (Signed)
History and Physical  [name] EOF:[id number] DOB: Feb 09, 1934 DOA: 09/13/2022  Referring physician: Dr. Pearline Cables, [place]  PCP: Curt Jews PA-C  Outpatient Specialists: ENT, pulmonology, cardiology. Patient coming from: Home  Chief Complaint: Left-sided headache, left-sided weakness numbness, and passing out.  HPI: Briana Hartman is a 88 y.o. female with medical history significant for factor V Leiden on Eliquis, type 2 diabetes, hypothyroidism, hypertension, hyperlipidemia, CKD 3A, coronary artery disease status post PCI, COPD, who presented to [facility] ED from home due to left-sided headache, left-sided weakness numbness and syncope.  In the past 2 weeks patient has had multiple neurology evaluation with work-up negative for stroke or acute findings.  Seen by neurology/stroke team in the ED, MRI brain negative for acute CVA.  Per neurology, question complex migraine versus psychogenic component to the headache.  Recommended EEG, admission to observation.    At the time of this visit, the patient's symptoms had resolved.  The patient was admitted by [facility], hospitalist service  ED Course: Tmax 98.2.  BP 119/48, pulse 55, respiratory 14, O2 saturation 96% on room air.  Review of Systems: Review of systems as noted in the HPI. All other systems reviewed and are negative.   Past Medical History:  Diagnosis Date   ACID REFLUX DISEASE    Adenomatous colon polyp    Allergy    Anemia    Anxiety    Arthritis    Asthma    Blood transfusion without reported diagnosis    Cataract    Cervical radiculopathy    COPD (chronic obstructive pulmonary disease) ([place])    Diabetes mellitus    Enlarged heart    born with   Fundic gland polyps of stomach, benign 03/06/2015   Hyperlipidemia    Hyperplastic polyp of stomach 03/06/2015   HYPERTENSION    HYPOTHYROIDISM    Irritable bowel syndrome    Long term current use of anticoagulant    Lumbar spinal stenosis    MITRAL VALVE PROLAPSE     Peripheral neuropathy    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 She has never used smokeless tobacco. She reports that she does not drink alcohol and does not use drugs.   Allergies  Allergen Reactions   Aspirin     unknown   Caffeine     unknown   Codeine Hives   Hydroxyzine     Other reaction(s): Other (See Comments) Nightmares and shaky hands   Lipitor [Atorvastatin] Other (See Comments)    Headache, numbness   Ampicillin Rash   Ketoconazole Rash    Topical cream caused rash   Penicillins Rash    Family History  Problem Relation Age of Onset   Heart disease Mother    Clotting disorder Mother    Pancreatic cancer Brother    Pancreatic disease Sister    Pancreatic cancer Sister    Breast cancer Sister    Diabetes Sister        x 3  Heart disease Sister    Diabetes Brother        x 3   Cancer Brother        lower bowel   Liver cancer Sister       Prior to Admission medications   Medication Sig Start Date End Date Taking? Authorizing Provider  albuterol (VENTOLIN HFA) 108 (90 Base) MCG/ACT inhaler Inhale 1 puff into the lungs every 6 (six) hours as needed for shortness of breath. 12/08/19   [provider]  Alcohol Swabs (B-D SINGLE USE SWABS REGULAR) PADS  11/15/19   [provider]  amLODipine (NORVASC) 10 MG tablet Take 10 mg by mouth daily. 01/18/20   [provider]  apixaban (ELIQUIS) 5 MG TABS tablet Take 1 tablet (5 mg total) by mouth 2 (two) times daily. Kindly provide 1 month supply with the free 1  month coupon, future supplies per PCP 09/07/22   Thurnell Lose, MD  Blood Glucose Monitoring Suppl (TRUE METRIX AIR GLUCOSE METER) w/Device KIT  07/04/19   [provider]  BREO ELLIPTA 100-25 MCG/ACT AEPB Inhale 1 puff into the lungs daily. 08/29/22   [provider]  carvedilol (COREG) 6.25 MG tablet Take 1 tablet (6.25 mg total) by mouth 2 (two) times daily. 09/07/22 09/07/23  Thurnell Lose, MD  Elastic Bandages & Supports (ABDOMINAL BINDER/ELASTIC XL) MISC 1 Device by Does not apply route as needed (to prevent abdominal pain from hernia). 03/02/15   Gatha Mayer, MD  ezetimibe (ZETIA) 10 MG tablet Take 1 tablet (10 mg total) by mouth daily. 09/07/22   Thurnell Lose, MD  fenofibrate (TRICOR) 145 MG tablet Take 1 tablet (145 mg total) by mouth daily. 10/03/21   Richardo Priest, MD  fluticasone (FLOVENT HFA) 220 MCG/ACT inhaler Inhale 1 puff into the lungs daily as needed (For shortness of breath). 10/26/18   [provider]  furosemide (LASIX) 40 MG tablet Take 1 tablet (40 mg total) by mouth 2 (two) times daily. Patient taking differently: Take 40 mg by mouth daily. 01/10/14   Biagio Borg, MD  gabapentin (NEURONTIN) 300 MG capsule Take 300 mg by mouth 3 (three) times daily. 01/03/20   [provider]  glucose blood (TRUE METRIX BLOOD GLUCOSE TEST) test strip TEST TWO TIMES DAILY 06/23/19   [provider]  isosorbide mononitrate (IMDUR) 60 MG 24 hr tablet Take 1 tablet (60 mg total) by mouth daily. 09/07/22   Thurnell Lose, MD  levothyroxine (SYNTHROID) 50 MCG tablet Take 50 mcg by mouth daily before breakfast.    [provider]  metFORMIN (GLUCOPHAGE) 500 MG tablet Take 500 mg by mouth daily with breakfast. 01/18/20   [provider]  nitroGLYCERIN (NITROSTAT) 0.4 MG SL tablet Place 1 tablet (0.4 mg total) under the tongue every 5 (five) minutes as needed. Patient taking differently: Place 0.4 mg under the tongue every 5 (five)  minutes as needed for chest pain. 03/15/20   Richardo Priest, MD  omeprazole (PRILOSEC) 20 MG capsule Take 20 mg by mouth daily. 12/26/19   [provider]  telmisartan (MICARDIS) 40 MG tablet Take 1 tablet (40 mg total) by mouth daily. 02/21/21   Richardo Priest, MD  TRUEplus Lancets 33G MISC  11/15/19   [provider]    Physical Exam: BP (!) 103/46   Pulse 64   Temp 98 F (36.7 C) (Oral)   Resp 20   Ht _0  (1.676 m)  Wt 82.6 kg   LMP  (LMP Unknown)   SpO2 98%   BMI 29.39 kg/m   General: 88 y.o. year-old female well developed well nourished in no acute distress.  Alert and interactive. Cardiovascular: Regular rate and rhythm with no rubs or gallops.  No thyromegaly or JVD noted.  No lower extremity edema. 2/4 pulses in all 4 extremities. Respiratory: Clear to auscultation with no wheezes or rales. Good inspiratory effort. Abdomen: Soft nontender nondistended with normal bowel sounds x4 quadrants. Muskuloskeletal: No cyanosis, clubbing or edema noted bilaterally Neuro: CN II-XII intact, strength, sensation, reflexes Skin: No ulcerative lesions noted or rashes Psychiatry: Mood is appropriate for condition and setting          Labs on Admission:  Basic Metabolic Panel: Recent Labs  Lab 09/07/22 0631 09/11/22 1616 09/11/22 1628 09/13/22 1332 09/13/22 2203  NA 140 138 138 137 139  K 3.8 4.0 4.0 6.0* 4.1  CL 105 100 99 102 103  CO2 27 29  --  27 28  GLUCOSE 95 119* 113* 111* 107*  BUN 19 26* 29* 24* 22  CREATININE 0.92 0.94 0.90 1.25* 1.06*  CALCIUM 8.7* 9.5  --  9.2 9.1   Liver Function Tests: Recent Labs  Lab 09/11/22 1616  AST 17  ALT 12  ALKPHOS 37*  BILITOT 0.4  PROT 6.3*  ALBUMIN 3.3*   No results for input(s): "LIPASE", "AMYLASE" in the last 168 hours. No results for input(s): "AMMONIA" in the last 168 hours. CBC: Recent Labs  Lab 09/11/22 1616 09/11/22 1628 09/13/22 1332  WBC 11.2*  --  10.7*  NEUTROABS 6.8  --  6.9  HGB 13.0  13.9 12.7  HCT 41.7 41.0 40.1  MCV 91.2  --  90.5  PLT 310  --  290   Cardiac Enzymes: No results for input(s): "CKTOTAL", "CKMB", "CKMBINDEX", "TROPONINI" in the last 168 hours.  BNP (last 3 results) No results for input(s): "BNP" in the last 8760 hours.  ProBNP (last 3 results) No results for input(s): "PROBNP" in the last 8760 hours.  CBG: Recent Labs  Lab 09/07/22 0757 09/11/22 1621 09/12/22 1203 09/12/22 1704 09/13/22 2259  GLUCAP 111* 114* 106* 106* 149*    Radiological Exams on Admission: MR BRAIN WO CONTRAST  Result Date: 09/13/2022 CLINICAL DATA:  Headache, chronic, new features are increased frequency. Patient discharge from hospital yesterday. Slurred speech today. Hypotension. EXAM: MRI HEAD WITHOUT CONTRAST TECHNIQUE: Multiplanar, multiecho pulse sequences of the brain and surrounding structures were obtained without intravenous contrast. COMPARISON:  MR head without contrast 09/11/2022 FINDINGS: Brain: No acute infarct, hemorrhage, or mass lesion is present. Periventricular and subcortical T2 hyperintensities are mildly advanced for age, stable. The ventricles are of normal size. No significant extraaxial fluid collection is present. The internal auditory canals are within normal limits. A remote lacunar infarct is present in the medial left cerebellum. Vascular: Flow is present in the major intracranial arteries. Skull and upper cervical spine: The craniocervical junction is normal. Upper cervical spine is within normal limits. Marrow signal is unremarkable. Small Tornwaldt cyst again noted in the midline. Sinuses/Orbits: T2 and FLAIR hyperintense lesion is noted superior and medial to the right globe. Right nasolacrimal duct is obstructed. 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 Right lens replacement is noted. The globes and orbits are otherwise unremarkable. The paranasal sinuses and mastoid air cells are clear. IMPRESSION: 1. No acute intracranial abnormality or  significant interval change. 2. Stable atrophy and white matter disease. This likely reflects the sequela  of chronic microvascular ischemia. 3. 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 Electronically Signed   By: San Morelle M.D.   On: 09/13/2022 17:22   CT Head Wo Contrast  Result Date: 09/13/2022 CLINICAL DATA:  Headache, hypertension, blurred vision, dysphagia in near syncope. History of migraine headaches. EXAM: CT HEAD WITHOUT CONTRAST TECHNIQUE: Contiguous axial images were obtained from the base of the skull through the vertex without intravenous contrast. RADIATION DOSE REDUCTION: This exam was performed according to the departmental dose-optimization program which includes automated exposure control, adjustment of the mA and/or kV according to patient size and/or use of iterative reconstruction technique. COMPARISON:  Head CT and brain MR dated 09/11/2022 FINDINGS: Brain: Stable mildly enlarged ventricles and cortical sulci. Stable mild patchy white matter low density in both cerebral hemispheres. No intracranial hemorrhage, mass lesion or CT evidence of acute infarction. Vascular: No hyperdense vessel or unexpected calcification. Skull: Stable bilateral hyperostosis frontalis. Sinuses/Orbits: Stable small oval mass at the medial aspect of the upper right globe. Unremarkable paranasal sinuses. Other: Bilateral concha bullosa, greater on the right. IMPRESSION: 1. No acute abnormality. 2. Stable mild atrophy and mild chronic small-vessel white matter ischemic changes. 3. Stable small, oval mass at the medial aspect of the upper right globe. Electronically Signed   By: Claudie Revering M.D.   On: 09/13/2022 14:26    EKG: I independently viewed the EKG done and my findings are as followed: Sinus rhythm rate of 64.  Nonspecific ST-T changes.  QTc 484.  Assessment/Plan Present on Admission: **None**  Principal Problem:   Left-sided weakness  Resolved left-sided  weakness MRI brain negative for acute CVA  Question complex migraine versus psychogenic component to the headache.  Seen by neurology, recommended EEG.   Will need to follow-up with neurology outpatient-discharge home on Topamax.- Follow neurology's recommendations.  Factor V Leiden, on Eliquis Continue home regimen  Hyperlipidemia Continue home regimen  Hypothyroidism Resume home levothyroxine  GERD Resume home p.o. daily Protonix   DVT prophylaxis: Eliquis  Code Status: Full code  Family Communication: None at bedside  Disposition Plan: Admitted to telemetry medical unit  Consults called: Neurology  Admission status: Observation status   Status is: Observation    Kayleen Memos MD [facility] Pager [phone]  If 7PM-7AM, please contact night-coverage [email or web address] Password [facility]  09/13/2022, 11:48 PM

## 2022-09-14 ENCOUNTER — Observation Stay (HOSPITAL_COMMUNITY): Payer: Medicare HMO

## 2022-09-14 DIAGNOSIS — R55 Syncope and collapse: Secondary | ICD-10-CM | POA: Diagnosis not present

## 2022-09-14 DIAGNOSIS — R531 Weakness: Secondary | ICD-10-CM | POA: Diagnosis not present

## 2022-09-14 DIAGNOSIS — G43809 Other migraine, not intractable, without status migrainosus: Secondary | ICD-10-CM

## 2022-09-14 LAB — TROPONIN I (HIGH SENSITIVITY): Troponin I (High Sensitivity): 17 ng/L (ref <18)

## 2022-09-14 LAB — BASIC METABOLIC PANEL
Anion gap: 10 (ref 5–15)
BUN: 23 mg/dL (ref 8–23)
CO2: 26 mmol/L (ref 22–32)
Calcium: 9.1 mg/dL (ref 8.9–10.3)
Chloride: 103 mmol/L (ref 98–111)
Creatinine, Ser: 1.01 mg/dL — ABNORMAL HIGH (ref 0.44–1.00)
GFR, Estimated: 54 mL/min — ABNORMAL LOW (ref >60)
Glucose, Bld: 189 mg/dL — ABNORMAL HIGH (ref 70–99)
Potassium: 4.2 mmol/L (ref 3.5–5.1)
Sodium: 139 mmol/L (ref 135–145)

## 2022-09-14 LAB — LIPID PANEL
Cholesterol: 157 mg/dL (ref 0–200)
HDL: 35 mg/dL — ABNORMAL LOW (ref >40)
LDL Cholesterol: 104 mg/dL — ABNORMAL HIGH (ref 0–99)
Total CHOL/HDL Ratio: 4.5 RATIO
Triglycerides: 88 mg/dL (ref <150)
VLDL: 18 mg/dL (ref 0–40)

## 2022-09-14 LAB — CBC
HCT: 40 % (ref 36.0–46.0)
Hemoglobin: 12.8 g/dL (ref 12.0–15.0)
MCH: 28.6 pg (ref 26.0–34.0)
MCHC: 32 g/dL (ref 30.0–36.0)
MCV: 89.3 fL (ref 80.0–100.0)
Platelets: 258 10*3/uL (ref 150–400)
RBC: 4.48 MIL/uL (ref 3.87–5.11)
RDW: 13.8 % (ref 11.5–15.5)
WBC: 8 10*3/uL (ref 4.0–10.5)
nRBC: 0 % (ref 0.0–0.2)

## 2022-09-14 LAB — MAGNESIUM: Magnesium: 2.3 mg/dL (ref 1.7–2.4)

## 2022-09-14 MED ORDER — SODIUM CHLORIDE 0.9 % IV SOLN: INTRAVENOUS | Status: DC

## 2022-09-14 MED ORDER — TOPIRAMATE 25 MG PO TABS: 25.0000 mg | ORAL_TABLET | Freq: Every day | ORAL | 0 refills | Status: DC

## 2022-09-14 MED ORDER — FLUTICASONE FUROATE-VILANTEROL 100-25 MCG/ACT IN AEPB
1.0000 | INHALATION_SPRAY | Freq: Every day | RESPIRATORY_TRACT | Status: DC
  Administered 2022-09-14: 1 via RESPIRATORY_TRACT
  Filled 2022-09-14: qty 28

## 2022-09-14 MED ORDER — TELMISARTAN 20 MG PO TABS: 20.0000 mg | ORAL_TABLET | Freq: Every day | ORAL | 0 refills | Status: DC

## 2022-09-14 MED ORDER — EZETIMIBE 10 MG PO TABS
10.0000 mg | ORAL_TABLET | Freq: Every day | ORAL | Status: DC
  Administered 2022-09-14: 10 mg via ORAL
  Filled 2022-09-14: qty 1

## 2022-09-14 MED ORDER — APIXABAN 5 MG PO TABS
5.0000 mg | ORAL_TABLET | Freq: Two times a day (BID) | ORAL | Status: DC
  Administered 2022-09-14: 5 mg via ORAL
  Filled 2022-09-14: qty 1

## 2022-09-14 MED ORDER — LEVOTHYROXINE SODIUM 25 MCG PO TABS
50.0000 ug | ORAL_TABLET | Freq: Every day | ORAL | Status: DC
  Administered 2022-09-14: 50 ug via ORAL
  Filled 2022-09-14: qty 2

## 2022-09-14 MED ORDER — ROSUVASTATIN CALCIUM 10 MG PO TABS: 10.0000 mg | ORAL_TABLET | Freq: Every day | ORAL | 0 refills | Status: AC

## 2022-09-14 MED ORDER — ISOSORBIDE MONONITRATE ER 30 MG PO TB24: 60.0000 mg | ORAL_TABLET | Freq: Every day | ORAL | 0 refills | Status: DC

## 2022-09-14 MED ORDER — TELMISARTAN 20 MG PO TABS: 20.0000 mg | ORAL_TABLET | Freq: Every day | ORAL | 0 refills | Status: AC

## 2022-09-14 MED ORDER — PANTOPRAZOLE SODIUM 40 MG PO TBEC
40.0000 mg | DELAYED_RELEASE_TABLET | Freq: Every day | ORAL | Status: DC
  Administered 2022-09-14: 40 mg via ORAL
  Filled 2022-09-14: qty 1

## 2022-09-14 MED ORDER — ROSUVASTATIN CALCIUM 10 MG PO TABS: 10.0000 mg | ORAL_TABLET | Freq: Every day | ORAL | 0 refills | Status: DC

## 2022-09-14 MED ORDER — SODIUM CHLORIDE 0.9 % IV BOLUS
500.0000 mL | Freq: Once | INTRAVENOUS | Status: AC
  Administered 2022-09-14: 500 mL via INTRAVENOUS

## 2022-09-14 MED ORDER — FENOFIBRATE 54 MG PO TABS
54.0000 mg | ORAL_TABLET | Freq: Every day | ORAL | Status: DC
  Administered 2022-09-14: 54 mg via ORAL
  Filled 2022-09-14: qty 1

## 2022-09-14 MED ORDER — ISOSORBIDE MONONITRATE ER 30 MG PO TB24: 60.0000 mg | ORAL_TABLET | Freq: Every day | ORAL | 0 refills | Status: AC

## 2022-09-14 NOTE — Evaluation (Addendum)
Clinical/Bedside Swallow Evaluation Patient Details  Name: JANESA DOCKERY MRN: [id number] Date of Birth: [birth year]  Today's Date: 09/14/2022 Time: SLP Start Time (ACUTE ONLY): 47 SLP Stop Time (ACUTE ONLY): 0840 SLP Time Calculation (min) (ACUTE ONLY): 13 min  Past Medical History:  Past Medical History:  Diagnosis Date   ACID REFLUX DISEASE    Adenomatous colon polyp    Allergy    Anemia    Anxiety    Arthritis    Asthma    Blood transfusion without reported diagnosis    Cataract    Cervical radiculopathy    COPD (chronic obstructive pulmonary disease) ([place])    Diabetes mellitus    Enlarged heart    born with   Fundic gland polyps of stomach, benign 03/06/2015   Hyperlipidemia    Hyperplastic polyp of stomach 03/06/2015   HYPERTENSION    HYPOTHYROIDISM    Irritable bowel syndrome    Long term current use of anticoagulant    Lumbar spinal stenosis    MITRAL VALVE PROLAPSE    Peripheral neuropathy    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 Of note pt recently discharged 09/12/2022 with negative workup for CVA. MRI 9/23 negative. Questionable complex migraine dx. PMH includes HTN, HLD, CKDIII, CAD s/p PCI, COPD.    Assessment / Plan / Recommendation  Clinical Impression  Pt presents with normal swallowing function c/b thorough mastication, the appearance of a brisk swallow response, no oral residue post-swallow, no s/s of aspiration.  No focal CN deficits impacting oral motor  function.  Continue current diet of regular solids/thin liquids. No dysphagia. SLP service will sign off. SLP Visit Diagnosis: Dysphagia, unspecified (R13.10)    Aspiration Risk  No limitations    Diet Recommendation   Regular solids, thin liquids  Medication Administration: Whole meds with liquid    Other  Recommendations Oral Care Recommendations: Oral care BID    Recommendations for follow up therapy are one component of a multi-disciplinary discharge planning process, led by the attending physician.  Recommendations may be updated based on patient status, additional functional criteria and insurance authorization.  Follow up Recommendations   No f/u for swallowing       Swallow Study   General Date of Onset: 09/13/22 HPI: 88 yo female presents to [facility] on 09/13/2022 with HA, L weakness and numbness, and syncope. Of note pt recently discharged 09/12/2022 with negative workup for CVA. MRI 9/23 negative. Questionable complex migraine dx. PMH includes HTN, HLD, CKDIII, CAD s/p PCI, COPD. Type of Study: Bedside Swallow Evaluation Previous Swallow Assessment: no Diet Prior to this Study: Regular;Thin liquids Temperature Spikes Noted: No Respiratory Status: Room air History of Recent Intubation: No Behavior/Cognition: Alert;Cooperative;Pleasant mood Oral Cavity Assessment: Within Functional Limits Oral Care Completed by SLP: No Oral Cavity - Dentition: Missing dentition Vision: Functional for self-feeding Self-Feeding Abilities: Able to feed self Patient Positioning: Upright in bed Baseline Vocal  Quality: Normal Volitional Cough: Strong Volitional Swallow: Able to elicit    Oral/Motor/Sensory Function Overall Oral Motor/Sensory Function: Within functional limits   Ice Chips Ice chips: Within functional limits   Thin Liquid Thin Liquid: Within functional limits    Nectar Thick Nectar Thick Liquid: Not tested   Honey Thick Honey Thick Liquid: Not tested   Puree Puree: Within  functional limits   Solid     Solid: Within functional limits      Juan Quam Laurice 09/14/2022,8:46 AM   Estill Bamberg L. Tivis Ringer, MA CCC/SLP Clinical Specialist - [place] Office number [phone]

## 2022-09-14 NOTE — ED Notes (Addendum)
SLP at bedside.

## 2022-09-14 NOTE — Care Management (Signed)
SATURATION QUALIFICATIONS: (This note is used to comply with regulatory documentation for home oxygen)  Patient Saturations on Room Air at Rest = 89%  Patient Saturations on Room Air while Ambulating = 88%  Patient Saturations on 2 Liters of oxygen while Ambulating = 95%  Please briefly explain why patient needs home oxygen: desaturates on sleeping and ambulation

## 2022-09-14 NOTE — Procedures (Signed)
Patient Name: Briana Hartman  MRN: [id number]  Epilepsy Attending: Lora Havens  Referring Physician/Provider: Kayleen Memos, DO  Date: 09/14/2022 Duration: 25.22 mins  Patient history: 88 year old woman with above past medical history presenting for the third time in the past couple of weeks with headaches and left-sided numbness and weakness. Also reports having an episode of syncope. EEG to evaluate for seizure.   Level of alertness: Asleep  AEDs during EEG study: None  Technical aspects: This EEG study was done with scalp electrodes positioned according to the 10-20 International system of electrode placement. Electrical activity was reviewed with band pass filter of 1-'70Hz'$ , sensitivity of 7 uV/mm, display speed of 60m/sec with a '60Hz'$  notched filter applied as appropriate. EEG data were recorded continuously and digitally stored.  Video monitoring was available and reviewed as appropriate.  Description: Sleep was characterized by vertex waves, sleep spindles (12 to 14 Hz), maximal frontocentral region.  Hyperventilation and photic stimulation were not performed.     IMPRESSION: This study during sleep only is within normal limits. No seizures or epileptiform discharges were seen throughout the recording.  A normal interictal EEG does not exclude nor support the diagnosis of epilepsy. If suspicion for interictal activity remains a concern, a repeat study can be considered.   [name] [name]

## 2022-09-14 NOTE — Discharge Instructions (Addendum)
Follow with Primary MD Curt Jews, PA-C in 7 days.  Please check your blood pressure twice a day write down the results in a notebook and show it to your physician within a week.  Get CBC, CMP, Magnesium -  checked next visit within 1 week by Primary MD    Activity: As tolerated with Full fall precautions use walker/cane & assistance as needed  Disposition Home    Diet: Heart Healthy Low Carb  Special Instructions: If you have smoked or chewed Tobacco  in the last 2 yrs please stop smoking, stop any regular Alcohol  and or any Recreational drug use.  On your next visit with your primary care physician please Get Medicines reviewed and adjusted.  Please request your Prim.MD to go over all Hospital Tests and Procedure/Radiological results at the follow up, please get all Hospital records sent to your Prim MD by signing hospital release before you go home.  If you experience worsening of your admission symptoms, develop shortness of breath, life threatening emergency, suicidal or homicidal thoughts you must seek medical attention immediately by calling 911 or calling your MD immediately  if symptoms less severe.  You Must read complete instructions/literature along with all the possible adverse reactions/side effects for all the Medicines you take and that have been prescribed to you. Take any new Medicines after you have completely understood and accpet all the possible adverse reactions/side effects.

## 2022-09-14 NOTE — ED Notes (Signed)
PT at bedside.

## 2022-09-14 NOTE — ED Notes (Signed)
EEG tech at bedside. 

## 2022-09-14 NOTE — TOC Transition Note (Addendum)
Transition of Care [facility]) - CM/SW Discharge Note   Patient Details  Name: Briana Hartman MRN: [id number] Date of Birth: 26-Sep-1934  [facility] CM/SW Contact:  Verdell Carmine, RN Phone Number: 09/14/2022, 2:46 PM   Clinical Narrative:     Met with patient at the bedside earlier this AM sons were present . Introduced self and role. Discussed [facility], she is serviced by [employer]. She has been back to the hospital twice since first admission for various neurological events.  Discussed oxygen levels at night. The patient stated she did testing with pulmonary. Cannot find documentation, Asked for walking oxygen qualifiers. Discussed xoygen at home. Patient goes between both her son's houses, so she would like a concentrator. Called [employer] to see availability, but without parameters we cannot get the oxygen 1420 Parameters performed and documented. Qualifies for oxygen called [employer] back,  discussed plan again. Encouraged ASAP as the patient is discharged. Charge RN aware of plan. Spoke to patient at bedside apologizing for delays.  And that the tank should be up soon and then she can be DC They are in agreement with this plan ( patient and son)     Barriers to Discharge: No Barriers Identified   Patient Goals and CMS Choice        Discharge Placement                       Discharge Plan and Services                DME Arranged: Oxygen DME Agency: [employer] Date DME Agency Contacted: 09/14/22 Time DME Agency Contacted: 54 Representative spoke with at DME Agency: [place]:  (Active with [place])          Social Determinants of Health ([place]) Interventions     Readmission Risk Interventions     No data to display

## 2022-09-14 NOTE — Progress Notes (Signed)
EEG complete - results pending 

## 2022-09-14 NOTE — Care Management Obs Status (Signed)
[place] NOTIFICATION   Patient Details  Name: Briana Hartman MRN: [id number] Date of Birth: 02/12/1934   Medicare Observation Status Notification Given:  Yes    Verdell Carmine, RN 09/14/2022, 10:08 AM

## 2022-09-14 NOTE — Discharge Summary (Signed)
Briana Hartman LZJ:[id number] DOB: 06-27-1934 DOA: 09/13/2022  PCP: Curt Jews, PA-C  Admit date: 09/13/2022  Discharge date: 09/14/2022  Admitted From: Home   Disposition:  Home   Recommendations for Outpatient Follow-up:   Follow up with PCP in 1-2 weeks  PCP Please obtain BMP/CBC, 2 view CXR in 1week,  (see Discharge instructions)   PCP Please follow up on the following pending results: Better blood pressure closely.   Home Health: PT, RN if qualifies   Equipment/Devices: None  Consultations: Neuro Discharge Condition: Stable    CODE STATUS: Full    Diet Recommendation: Heart Healthy Low Carb  Chief Complaint  Patient presents with   Hypotension     Brief history of present illness from the day of admission and additional interim summary    88 y.o. female past medical history of factor V Leiden mutation/PE, diabetes, hypertension and hyperlipidemia, CKD stage IIIa, coronary artery disease status post PCI, COPD, hypothyroidism, currently on Eliquis, recently seen by neurology in the past couple of weeks for speech related deficits and left-sided headache along with weakness is, received a full stroke/TIA work-up with symptoms likely concerning for complex migraine as well as related to grief on the loss of her husband recently, presented back to the ER with complaints of left-sided headache, left-sided numbness and an episode of unresponsiveness.                                                                 Hospital Course    Headache and loss of consciousness, questionable left-sided weakness which has resolved.  This is syncope due to severe hypotension, during the time of this episode at home her blood pressure was checked twice with systolic blood pressure being in 60s, she is also on multiple  blood pressure medications, after receiving IV fluids and holding blood pressure medications her blood pressures have improved and she is now symptom-free.  She has had recent full TIA work-up and this admission she had brain imaging and EEG which were unremarkable.  Was question of complex migraine for which she has been placed on Topamax although I think the likelihood of severe hypotension causing her symptoms is more likely.  I have adjusted her discharge blood pressure medications with close outpatient PCP follow-up, I have discussed this with patient's daughter-in-law and she will be checking patient's blood pressure twice a day at home and maintaining a log book showing to PCP within the next 3 to 4 days.  Patient had MRI brain and EEG which were unremarkable.  Also at home PT and RN if she qualifies for the same.   Factor V Leyden deficiency.  On Eliquis continue.  Dyslipidemia.  Placed on Crestor.  Hypothyroidism.  Home dose Synthroid.  GERD.  On PPI.   Discharge diagnosis  Principal Problem:   Left-sided weakness    Discharge instructions    Discharge Instructions     Diet - low sodium heart healthy   Complete by: As directed    Discharge instructions   Complete by: As directed    Follow with Primary MD Curt Jews, PA-C in 7 days.  Please check your blood pressure twice a day write down the results in a notebook and show it to your physician within a week.  Get CBC, CMP, Magnesium -  checked next visit within 1 week by Primary MD    Activity: As tolerated with Full fall precautions use walker/cane & assistance as needed  Disposition Home    Diet: Heart Healthy Low Carb  Special Instructions: If you have smoked or chewed Tobacco  in the last 2 yrs please stop smoking, stop any regular Alcohol  and or any Recreational drug use.  On your next visit with your primary care physician please Get Medicines reviewed and adjusted.  Please request your Prim.MD to go  over all Hospital Tests and Procedure/Radiological results at the follow up, please get all Hospital records sent to your Prim MD by signing hospital release before you go home.  If you experience worsening of your admission symptoms, develop shortness of breath, life threatening emergency, suicidal or homicidal thoughts you must seek medical attention immediately by calling 911 or calling your MD immediately  if symptoms less severe.  You Must read complete instructions/literature along with all the possible adverse reactions/side effects for all the Medicines you take and that have been prescribed to you. Take any new Medicines after you have completely understood and accpet all the possible adverse reactions/side effects.   Increase activity slowly   Complete by: As directed        Discharge Medications   Allergies as of 09/14/2022       Reactions   Aspirin    unknown   Caffeine    unknown   Codeine Hives   Hydroxyzine    Other reaction(s): Other (See Comments) Nightmares and shaky hands   Lipitor [atorvastatin] Other (See Comments)   Headache, numbness   Ampicillin Rash   Ketoconazole Rash   Topical cream caused rash   Penicillins Rash        Medication List     STOP taking these medications    amLODipine 10 MG tablet Commonly known as: NORVASC       TAKE these medications    Abdominal Binder/Elastic XL Misc 1 Device by Does not apply route as needed (to prevent abdominal pain from hernia).   albuterol 108 (90 Base) MCG/ACT inhaler Commonly known as: VENTOLIN HFA Inhale 1 puff into the lungs every 6 (six) hours as needed for shortness of breath.   apixaban 5 MG Tabs tablet Commonly known as: ELIQUIS Take 1 tablet (5 mg total) by mouth 2 (two) times daily. Kindly provide 1 month supply with the free 1 month coupon, future supplies per PCP   B-D SINGLE USE SWABS REGULAR Pads   Breo Ellipta 100-25 MCG/ACT Aepb Generic drug: fluticasone  furoate-vilanterol Inhale 1 puff into the lungs daily.   carvedilol 6.25 MG tablet Commonly known as: Coreg Take 1 tablet (6.25 mg total) by mouth 2 (two) times daily.   ezetimibe 10 MG tablet Commonly known as: ZETIA Take 1 tablet (10 mg total) by mouth daily.   fenofibrate 145 MG tablet Commonly known as: Tricor Take 1 tablet (145 mg total) by mouth daily.  fluticasone 220 MCG/ACT inhaler Commonly known as: FLOVENT HFA Inhale 1 puff into the lungs daily as needed (For shortness of breath).   furosemide 40 MG tablet Commonly known as: LASIX Take 1 tablet (40 mg total) by mouth 2 (two) times daily. What changed: when to take this   gabapentin 300 MG capsule Commonly known as: NEURONTIN Take 300 mg by mouth 3 (three) times daily.   isosorbide mononitrate 30 MG 24 hr tablet Commonly known as: IMDUR Take 2 tablets (60 mg total) by mouth daily. What changed: medication strength   levothyroxine 50 MCG tablet Commonly known as: SYNTHROID Take 50 mcg by mouth daily before breakfast.   metFORMIN 500 MG tablet Commonly known as: GLUCOPHAGE Take 500 mg by mouth daily with breakfast.   nitroGLYCERIN 0.4 MG SL tablet Commonly known as: NITROSTAT Place 1 tablet (0.4 mg total) under the tongue every 5 (five) minutes as needed. What changed: reasons to take this   omeprazole 20 MG capsule Commonly known as: PRILOSEC Take 20 mg by mouth daily.   rosuvastatin 10 MG tablet Commonly known as: Crestor Take 1 tablet (10 mg total) by mouth daily.   telmisartan 20 MG tablet Commonly known as: MICARDIS Take 1 tablet (20 mg total) by mouth daily. What changed:  medication strength how much to take   topiramate 25 MG tablet Commonly known as: Topamax Take 1 tablet (25 mg total) by mouth at bedtime.   True Metrix Air Glucose Meter w/Device Kit   True Metrix Blood Glucose Test test strip Generic drug: glucose blood TEST TWO TIMES DAILY   TRUEplus Lancets 33G Misc                Durable Medical Equipment  (From admission, onward)           Start     Ordered   09/14/22 1030  For home use only DME oxygen  Once       Comments: Patient will need a concentrator unit as she lives between two sons.  Question Answer Comment  Length of Need Lifetime   Mode or (Route) Nasal cannula   Liters per Minute 2   Frequency Only at night (stationary unit needed)   Oxygen conserving device Yes   Oxygen delivery system Gas      09/14/22 1030             Follow-up Information     Justin Mend, Meghan H, PA-C. Schedule an appointment as soon as possible for a visit in 1 week(s).   Specialty: Physician Assistant Contact information: [address] [place] [address] [phone]         [place]. Schedule an appointment as soon as possible for a visit in 1 week(s).   Contact information: [address]     [place]  [id number] [phone]                Major procedures and Radiology Reports - PLEASE review detailed and final reports thoroughly  -       EEG adult  Result Date: 09/14/2022 Lora Havens, MD     09/14/2022  7:59 AM Patient Name: Briana Hartman MRN: [id number] Epilepsy Attending: Lora Havens Referring Physician/Provider: Kayleen Memos, DO Date: 09/14/2022 Duration: 25.22 mins Patient history: 89 year old woman with above past medical history presenting for the third time in the past couple of weeks with headaches and left-sided numbness and weakness. Also reports having an  episode of syncope. EEG to evaluate for seizure. Level of alertness: Asleep AEDs during EEG study: None Technical aspects: This EEG study was done with scalp electrodes positioned according to the 10-20 International system of electrode placement. Electrical activity was reviewed with band pass filter of 1-_0 , sensitivity of 7 uV/mm, display speed of 46m/sec with a _1  notched filter  applied as appropriate. EEG data were recorded continuously and digitally stored.  Video monitoring was available and reviewed as appropriate. Description: Sleep was characterized by vertex waves, sleep spindles (12 to 14 Hz), maximal frontocentral region.  Hyperventilation and photic stimulation were not performed.   IMPRESSION: This study during sleep only is within normal limits. No seizures or epileptiform discharges were seen throughout the recording. A normal interictal EEG does not exclude nor support the diagnosis of epilepsy. If suspicion for interictal activity remains a concern, a repeat study can be considered. PLora Havens  MR BRAIN WO CONTRAST  Result Date: 09/13/2022 CLINICAL DATA:  Headache, chronic, new features are increased frequency. Patient discharge from hospital yesterday. Slurred speech today. Hypotension. EXAM: MRI HEAD WITHOUT CONTRAST TECHNIQUE: Multiplanar, multiecho pulse sequences of the brain and surrounding structures were obtained without intravenous contrast. COMPARISON:  MR head without contrast 09/11/2022 FINDINGS: Brain: No acute infarct, hemorrhage, or mass lesion is present. Periventricular and subcortical T2 hyperintensities are mildly advanced for age, stable. The ventricles are of normal size. No significant extraaxial fluid collection is present. The internal auditory canals are within normal limits. A remote lacunar infarct is present in the medial left cerebellum. Vascular: Flow is present in the major intracranial arteries. Skull and upper cervical spine: The craniocervical junction is normal. Upper cervical spine is within normal limits. Marrow signal is unremarkable. Small Tornwaldt cyst again noted in the midline. Sinuses/Orbits: T2 and FLAIR hyperintense lesion is noted superior and medial to the right globe. Right nasolacrimal duct is obstructed. 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 Right lens replacement is noted. The globes and orbits are otherwise  unremarkable. The paranasal sinuses and mastoid air cells are clear. IMPRESSION: 1. No acute intracranial abnormality or significant interval change. 2. Stable atrophy and white matter disease. This likely reflects the sequela of chronic microvascular ischemia. 3. 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 Electronically Signed   By: CSan MorelleM.D.   On: 09/13/2022 17:22   CT Head Wo Contrast  Result Date: 09/13/2022 CLINICAL DATA:  Headache, hypertension, blurred vision, dysphagia in near syncope. History of migraine headaches. EXAM: CT HEAD WITHOUT CONTRAST TECHNIQUE: Contiguous axial images were obtained from the base of the skull through the vertex without intravenous contrast. RADIATION DOSE REDUCTION: This exam was performed according to the departmental dose-optimization program which includes automated exposure control, adjustment of the mA and/or kV according to patient size and/or use of iterative reconstruction technique. COMPARISON:  Head CT and brain MR dated 09/11/2022 FINDINGS: Brain: Stable mildly enlarged ventricles and cortical sulci. Stable mild patchy white matter low density in both cerebral hemispheres. No intracranial hemorrhage, mass lesion or CT evidence of acute infarction. Vascular: No hyperdense vessel or unexpected calcification. Skull: Stable bilateral hyperostosis frontalis. Sinuses/Orbits: Stable small oval mass at the medial aspect of the upper right globe. Unremarkable paranasal sinuses. Other: Bilateral concha bullosa, greater on the right. IMPRESSION: 1. No acute abnormality. 2. Stable mild atrophy and mild chronic small-vessel white matter ischemic changes. 3. Stable small, oval mass at the medial aspect of the upper right globe. Electronically Signed   By: SClaudie Revering  M.D.   On: 09/13/2022 14:26   MR BRAIN WO CONTRAST  Result Date: 09/12/2022 CLINICAL DATA:  Initial evaluation for acute TIA. EXAM: MRI HEAD WITHOUT CONTRAST  TECHNIQUE: Multiplanar, multiecho pulse sequences of the brain and surrounding structures were obtained without intravenous contrast. COMPARISON:  Prior CT from earlier the same day. FINDINGS: Brain: Cerebral volume within normal limits. Mild chronic microvascular ischemic disease. No evidence for acute or subacute ischemia. Gray-white matter differentiation maintained. No areas of chronic cortical infarction. No acute or chronic intracranial blood products. No mass lesion, midline shift or mass effect. No hydrocephalus or extra-axial fluid collection. Pituitary gland suprasellar region normal. Vascular: Major intracranial vascular flow voids are maintained. Skull and upper cervical spine: Craniocervical junction normal. Bone marrow signal intensity diffusely decreased on T1 weighted sequence, nonspecific, but most commonly related to anemia, smoking or obesity. No focal marrow replacing lesion. No scalp soft tissue abnormality. Sinuses/Orbits: Patient status post ocular lens replacement on the right. 9 mm FLAIR hyperintense lesion at the medial aspect of the right globe again noted, stable, and indeterminate. P mild mucosal thickening within the ethmoidal air cells. Paranasal sinuses are otherwise largely clear. No significant mastoid effusion. Other: Small retention cyst noted within the nasopharynx. IMPRESSION: 1. No acute intracranial abnormality. 2. Mild chronic microvascular ischemic disease. 3. 9 mm FLAIR hyperintense lesion at the medial aspect of the right globe, stable, and indeterminate. Correlation with physical exam recommended. Electronically Signed   By: Jeannine Boga M.D.   On: 09/12/2022 00:28   CT HEAD CODE STROKE WO CONTRAST  Result Date: 09/11/2022 CLINICAL DATA:  Code stroke.  Speech and vision deficit EXAM: CT HEAD WITHOUT CONTRAST TECHNIQUE: Contiguous axial images were obtained from the base of the skull through the vertex without intravenous contrast. RADIATION DOSE REDUCTION:  This exam was performed according to the departmental dose-optimization program which includes automated exposure control, adjustment of the mA and/or kV according to patient size and/or use of iterative reconstruction technique. COMPARISON:  09/04/2022 FINDINGS: Evaluation is somewhat limited by motion artifact. Brain: No evidence of acute infarction, hemorrhage, cerebral edema, mass, mass effect, or midline shift. No hydrocephalus or extra-axial collection. Vascular: No hyperdense vessel. Atherosclerotic calcifications in the intracranial carotid and vertebral arteries. Skull: Hyperostosis frontalis. Negative for fracture or focal lesion. Sinuses/Orbits: No acute finding.  Right lens replacement. Other: The mastoid air cells are well aerated. ASPECTS [facility] Stroke Program Early CT Score) - Ganglionic level infarction (caudate, lentiform nuclei, internal capsule, insula, M1-M3 cortex): 7 - Supraganglionic infarction (M4-M6 cortex): 3 Total score (0-10 with 10 being normal): 10 IMPRESSION: 1. No acute intracranial process. 2. ASPECTS is 10 Code stroke imaging results were communicated on 09/11/2022 at 4:30 pm to provider Dr. Cheral Marker via secure text paging. Electronically Signed   By: Merilyn Baba M.D.   On: 09/11/2022 16:31   ECHOCARDIOGRAM COMPLETE  Result Date: 09/06/2022    ECHOCARDIOGRAM REPORT   Patient Name:   MACKLYN GLANDON Date of Exam: 09/06/2022 Medical Rec #:  [id number]           Height:       66.0 in Accession #:    [id number]          Weight:       182.2 lb Date of Birth:  April 01, 1934           BSA:          1.922 m Patient Age:    88 years  BP:           124/51 mmHg Patient Gender: F                   HR:           64 bpm. Exam Location:  Inpatient Procedure: 2D Echo, Cardiac Doppler and Color Doppler Indications:    TIA  History:        Patient has prior history of Echocardiogram examinations, most                 recent 02/20/2020. CAD, COPD; Risk Factors:Hypertension, Diabetes                  and Dyslipidemia. CKD. Hx pulmonary embolism.  Sonographer:    Clayton Lefort RDCS (AE) Referring Phys: [id number] [place]  1. Left ventricular ejection fraction, by estimation, is 60 to 65%. The left ventricle has normal function. The left ventricle has no regional wall motion abnormalities. There is moderate concentric left ventricular hypertrophy. Left ventricular diastolic parameters are consistent with Grade I diastolic dysfunction (impaired relaxation). Elevated left atrial pressure.  2. Right ventricular systolic function is normal. The right ventricular size is normal.  3. The mitral valve is normal in structure. No evidence of mitral valve regurgitation. No evidence of mitral stenosis.  4. The aortic valve is normal in structure. There is mild calcification of the aortic valve. Aortic valve regurgitation is not visualized. Aortic valve sclerosis is present, with no evidence of aortic valve stenosis.  5. The inferior vena cava is normal in size with greater than 50% respiratory variability, suggesting right atrial pressure of 3 mmHg. FINDINGS  Left Ventricle: Left ventricular ejection fraction, by estimation, is 60 to 65%. The left ventricle has normal function. The left ventricle has no regional wall motion abnormalities. The left ventricular internal cavity size was normal in size. There is  moderate concentric left ventricular hypertrophy. Left ventricular diastolic parameters are consistent with Grade I diastolic dysfunction (impaired relaxation). Elevated left atrial pressure. Right Ventricle: The right ventricular size is normal. No increase in right ventricular wall thickness. Right ventricular systolic function is normal. Left Atrium: Left atrial size was normal in size. Right Atrium: Right atrial size was normal in size. Pericardium: There is no evidence of pericardial effusion. Mitral Valve: The mitral valve is normal in structure. No evidence of mitral valve regurgitation. No  evidence of mitral valve stenosis. Tricuspid Valve: The tricuspid valve is normal in structure. Tricuspid valve regurgitation is not demonstrated. No evidence of tricuspid stenosis. Aortic Valve: The aortic valve is normal in structure. There is mild calcification of the aortic valve. Aortic valve regurgitation is not visualized. Aortic valve sclerosis is present, with no evidence of aortic valve stenosis. Aortic valve mean gradient  measures 3.0 mmHg. Aortic valve peak gradient measures 6.5 mmHg. Aortic valve area, by VTI measures 2.89 cm. Pulmonic Valve: The pulmonic valve was not well visualized. Pulmonic valve regurgitation is not visualized. No evidence of pulmonic stenosis. Aorta: The aortic root is normal in size and structure. Venous: The inferior vena cava is normal in size with greater than 50% respiratory variability, suggesting right atrial pressure of 3 mmHg. IAS/Shunts: No atrial level shunt detected by color flow Doppler.  LEFT VENTRICLE PLAX 2D LVIDd:         4.90 cm   Diastology LVIDs:         3.20 cm   LV e' medial:    5.22 cm/s LV PW:  1.44 cm   LV E/e' medial:  14.4 LV IVS:        1.40 cm   LV e' lateral:   5.22 cm/s LVOT diam:     2.30 cm   LV E/e' lateral: 14.4 LV SV:         84 LV SV Index:   44 LVOT Area:     4.15 cm  RIGHT VENTRICLE             IVC RV Basal diam:  3.00 cm     IVC diam: 1.70 cm RV S prime:     14.00 cm/s TAPSE (M-mode): 2.0 cm LEFT ATRIUM             Index        RIGHT ATRIUM           Index LA diam:        3.30 cm 1.72 cm/m   RA Area:     13.20 cm LA Vol (A2C):   44.1 ml 22.94 ml/m  RA Volume:   31.00 ml  16.13 ml/m LA Vol (A4C):   37.9 ml 19.72 ml/m LA Biplane Vol: 44.9 ml 23.36 ml/m  AORTIC VALVE AV Area (Vmax):    3.18 cm AV Area (Vmean):   3.06 cm AV Area (VTI):     2.89 cm AV Vmax:           127.00 cm/s AV Vmean:          84.100 cm/s AV VTI:            0.290 m AV Peak Grad:      6.5 mmHg AV Mean Grad:      3.0 mmHg LVOT Vmax:         97.30 cm/s LVOT  Vmean:        61.900 cm/s LVOT VTI:          0.202 m LVOT/AV VTI ratio: 0.70  AORTA Ao Root diam: 3.40 cm Ao Asc diam:  3.50 cm MITRAL VALVE MV Area (PHT): 2.55 cm    SHUNTS MV Decel Time: 298 msec    Systemic VTI:  0.20 m MV E velocity: 75.20 cm/s  Systemic Diam: 2.30 cm MV A velocity: 85.90 cm/s MV E/A ratio:  0.88 Mihai Croitoru MD Electronically signed by Sanda Klein MD Signature Date/Time: 09/06/2022/2:35:54 PM    Final    MR BRAIN WO CONTRAST  Result Date: 09/05/2022 CLINICAL DATA:  Acute neuro deficit.  Rule out stroke EXAM: MRI HEAD WITHOUT CONTRAST TECHNIQUE: Multiplanar, multiecho pulse sequences of the brain and surrounding structures were obtained without intravenous contrast. COMPARISON:  CT head 09/04/2022 FINDINGS: Brain: No acute infarction, hemorrhage, hydrocephalus, extra-axial collection or mass lesion. Mild white matter changes with patchy periventricular deep white matter hyperintensities bilaterally. Brainstem intact. Vascular: Normal arterial flow voids Skull and upper cervical spine: No focal skeletal abnormality Sinuses/Orbits: 9 mm mass medial to the right orbit as noted on CT yesterday. Possible hemangioma. Right cataract extraction paranasal sinuses clear. Other: None IMPRESSION: Negative for acute infarct Mild chronic microvascular ischemic change 9 mm mass medial to the right globe. Electronically Signed   By: Franchot Gallo M.D.   On: 09/05/2022 18:57   CT ANGIO HEAD NECK W WO CM  Result Date: 09/04/2022 CLINICAL DATA:  Stroke follow-up. Right-sided facial droop with slurred speech. EXAM: CT ANGIOGRAPHY HEAD AND NECK TECHNIQUE: Multidetector CT imaging of the head and neck was performed using the standard protocol during bolus administration of  intravenous contrast. Multiplanar CT image reconstructions and MIPs were obtained to evaluate the vascular anatomy. Carotid stenosis measurements (when applicable) are obtained utilizing NASCET criteria, using the distal internal  carotid diameter as the denominator. RADIATION DOSE REDUCTION: This exam was performed according to the departmental dose-optimization program which includes automated exposure control, adjustment of the mA and/or kV according to patient size and/or use of iterative reconstruction technique. CONTRAST:  85m OMNIPAQUE IOHEXOL 350 MG/ML SOLN COMPARISON:  Head CT 12/11/2012 FINDINGS: CT HEAD FINDINGS Brain: There is no evidence of an acute infarct, intracranial hemorrhage, mass, midline shift, or extra-axial fluid collection. The ventricles and sulci are normal for age. Hypodensities in the cerebral white matter bilaterally are unchanged and nonspecific but compatible with mild chronic small vessel ischemic disease. Vascular: Calcified atherosclerosis at the skull base. No hyperdense vessel. Skull: No fracture or suspicious osseous lesion. Sinuses/Orbits: Paranasal sinuses and mastoid air cells are clear. Right cataract extraction. 9 mm mass in the anterior aspect of the right orbit medial to the globe which was also present in 2020 though is slightly larger today, partially enhances on the CTA images, and appears to be associated with a vein, overall benign in appearance and potentially reflecting a cavernous venous malformation or varix. Other: None. Review of the MIP images confirms the above findings CTA NECK FINDINGS Aortic arch: Standard 3 vessel aortic arch with mild atherosclerotic plaque. Patent brachiocephalic and subclavian arteries with mixed soft and calcified plaque in the proximal aspects of both subclavian arteries resulting in luminal irregularity but no flow limiting stenosis. Right carotid system: Patent with a small amount of mixed calcified and soft plaque in the carotid bulb. No evidence of a significant stenosis or dissection. Tortuous common and proximal internal carotid arteries. Left carotid system: Patent with a small amount of predominantly calcified plaque at the carotid bifurcation. No  evidence of a significant stenosis or dissection. Tortuous mid cervical ICA. Vertebral arteries: Patent without evidence of a significant stenosis or dissection. Codominant. Skeleton: Advanced disc degeneration in the lower cervical spine. Asymmetrically advanced right-sided cervical facet arthrosis, greatest at C4-5. Moderate spinal stenosis at C6-7 due to prominent posterior vertebral osteophytes. Other neck: No evidence of cervical lymphadenopathy or mass. Upper chest: Clear lung apices. Review of the MIP images confirms the above findings CTA HEAD FINDINGS Anterior circulation: The internal carotid arteries are patent from skull base to carotid termini with mild-to-moderate atherosclerotic plaque bilaterally not resulting in significant stenosis. ACAs and MCAs are patent without evidence of a proximal branch occlusion or a significant A1 or left M1 stenosis. There is a severe distal right M1 stenosis, and there is a moderate proximal right A2 stenosis. No aneurysm is identified. Posterior circulation: The intracranial vertebral arteries are patent to the basilar with mild atherosclerotic irregularity but no significant stenosis. Patent PICA, AICA, and SCA origins are seen bilaterally. The basilar artery is widely patent. There are small posterior communicating arteries bilaterally. Both PCAs are patent with atherosclerotic irregularity involving the branch vessels but no evidence of a flow limiting proximal stenosis. There is an early bifurcation of the left PCA. No aneurysm is identified. Venous sinuses: Patent. Anatomic variants: None. Review of the MIP images confirms the above findings IMPRESSION: 1. No evidence of acute intracranial abnormality. 2. No large vessel occlusion. 3. Intracranial atherosclerosis including severe distal right M1 and moderate right A2 stenoses. 4. Mild cervical carotid artery atherosclerosis without significant stenosis. 5. 9 mm mass in the anterior right orbit, benign in  appearance and potentially  reflecting a cavernous venous malformation or varix. 6. Aortic Atherosclerosis (ICD10-I70.0). Electronically Signed   By: Logan Bores M.D.   On: 09/04/2022 18:03   DG Chest Portable 1 View  Result Date: 09/04/2022 CLINICAL DATA:  Chest pain EXAM: PORTABLE CHEST 1 VIEW COMPARISON:  05/31/2013, 02/22/2021 FINDINGS: Cardiomegaly. Aortic atherosclerosis. Central pulmonary vessels appears slightly enlarged. No pleural effusion or pneumothorax. IMPRESSION: 1. Cardiomegaly without edema or pleural effusion 2. Prominent central pulmonary vessels suggest arterial hypertension Electronically Signed   By: Donavan Foil M.D.   On: 09/04/2022 16:56    Micro Results     Recent Results (from the past 240 hour(s))  Resp Panel by RT-PCR (Flu A&B, Covid) Anterior Nasal Swab     Status: None   Collection Time: 09/04/22  4:10 PM   Specimen: Anterior Nasal Swab  Result Value Ref Range Status   SARS Coronavirus 2 by RT PCR NEGATIVE NEGATIVE Final    Comment: (NOTE) SARS-CoV-2 target nucleic acids are NOT DETECTED.  The SARS-CoV-2 RNA is generally detectable in upper respiratory specimens during the acute phase of infection. The lowest concentration of SARS-CoV-2 viral copies this assay can detect is 138 copies/mL. A negative result does not preclude SARS-Cov-2 infection and should not be used as the sole basis for treatment or other patient management decisions. A negative result may occur with  improper specimen collection/handling, submission of specimen other than nasopharyngeal swab, presence of viral mutation(s) within the areas targeted by this assay, and inadequate number of viral copies(<138 copies/mL). A negative result must be combined with clinical observations, patient history, and epidemiological information. The expected result is Negative.  Fact Sheet for Patients:  EntrepreneurPulse.com.au  Fact Sheet for Healthcare Providers:   IncredibleEmployment.be  This test is no t yet approved or cleared by the [place] FDA and  has been authorized for detection and/or diagnosis of SARS-CoV-2 by FDA under an Emergency Use Authorization (EUA). This EUA will remain  in effect (meaning this test can be used) for the duration of the COVID-19 declaration under Section 564(b)(1) of the Act, 21 U.S.C.section 360bbb-3(b)(1), unless the authorization is terminated  or revoked sooner.       Influenza A by PCR NEGATIVE NEGATIVE Final   Influenza B by PCR NEGATIVE NEGATIVE Final    Comment: (NOTE) The Xpert Xpress SARS-CoV-2/FLU/RSV plus assay is intended as an aid in the diagnosis of influenza from Nasopharyngeal swab specimens and should not be used as a sole basis for treatment. Nasal washings and aspirates are unacceptable for Xpert Xpress SARS-CoV-2/FLU/RSV testing.  Fact Sheet for Patients: EntrepreneurPulse.com.au  Fact Sheet for Healthcare Providers: IncredibleEmployment.be  This test is not yet approved or cleared by the [place] FDA and has been authorized for detection and/or diagnosis of SARS-CoV-2 by FDA under an Emergency Use Authorization (EUA). This EUA will remain in effect (meaning this test can be used) for the duration of the COVID-19 declaration under Section 564(b)(1) of the Act, 21 U.S.C. section 360bbb-3(b)(1), unless the authorization is terminated or revoked.  Performed at [facility], [address] [place], [place] [address]   Urine Culture     Status: Abnormal   Collection Time: 09/05/22  4:39 PM   Specimen: Urine, Clean Catch  Result Value Ref Range Status   Specimen Description URINE, CLEAN CATCH  Final   Special Requests NONE  Final   Culture (A)  Final    <10,000 COLONIES/mL INSIGNIFICANT GROWTH Performed at [facility], [place].  [address], [place], [place] [address]    Report Status 09/06/2022 FINAL   Final    Today   Subjective    Symphoni Helbling today has no headache,no chest abdominal pain,no new weakness tingling or numbness, feels much better wants to go home today.     Objective   Blood pressure (!) 165/69, pulse 74, temperature 97.7 F (36.5 C), temperature source Oral, resp. rate 19, height _0  (1.676 m), weight 82.6 kg, SpO2 93 %.   Intake/Output Summary (Last 24 hours) at 09/14/2022 1032 Last data filed at 09/14/2022 0212 Gross per 24 hour  Intake 1053 ml  Output --  Net 1053 ml    Exam  Awake Alert, No new F.N deficits,    [place].AT,PERRAL Supple Neck,   Symmetrical Chest wall movement, Good air movement bilaterally, CTAB RRR,No Gallops,   +ve B.Sounds, Abd Soft, Non tender,  No Cyanosis, Clubbing or edema    Data Review   Recent Labs  Lab 09/11/22 1616 09/11/22 1628 09/13/22 1332 09/14/22 0514  WBC 11.2*  --  10.7* 8.0  HGB 13.0 13.9 12.7 12.8  HCT 41.7 41.0 40.1 40.0  PLT 310  --  290 258  MCV 91.2  --  90.5 89.3  MCH 28.4  --  28.7 28.6  MCHC 31.2  --  31.7 32.0  RDW 14.2  --  14.3 13.8  LYMPHSABS 3.1  --  2.6  --   MONOABS 0.9  --  0.8  --   EOSABS 0.2  --  0.2  --   BASOSABS 0.1  --  0.1  --     Recent Labs  Lab 09/11/22 1616 09/11/22 1628 09/11/22 2143 09/13/22 1332 09/13/22 2203 09/14/22 0511 09/14/22 0514  NA 138 138  --  137 139 139  --   K 4.0 4.0  --  6.0* 4.1 4.2  --   CL 100 99  --  102 103 103  --   CO2 29  --   --  _1 --   GLUCOSE 119* 113*  --  111* 107* 189*  --   BUN 26* 29*  --  24* 22 23  --   CREATININE 0.94 0.90  --  1.25* 1.06* 1.01*  --   CALCIUM 9.5  --   --  9.2 9.1 9.1  --   AST 17  --   --   --   --   --   --   ALT 12  --   --   --   --   --   --   ALKPHOS 37*  --   --   --   --   --   --   BILITOT 0.4  --   --   --   --   --   --   ALBUMIN 3.3*  --   --   --   --   --   --   MG  --   --   --   --   --   --  2.3  CRP  --   --  1.1*  --   --   --   --   INR 1.4*  --   --   --   --   --   --     Lab Results  Component Value Date   CHOL 157 09/14/2022   HDL 35 (L) 09/14/2022   LDLCALC 104 (H) 09/14/2022  LDLDIRECT 113.4 05/31/2013   TRIG 88 09/14/2022   CHOLHDL 4.5 09/14/2022    Total Time in preparing paper work, data evaluation and todays exam - 84 minutes  [name] M.D on 09/14/2022 at 10:32 AM  [facility]

## 2022-09-14 NOTE — Progress Notes (Addendum)
Neurology Progress Note   S:// Patient laying in bed in NAD. She states her headache is better   O:// Current vital signs: BP (!) 177/74   Pulse 79   Temp 97.7 F (36.5 C) (Oral)   Resp 16   Ht _0  (1.676 m)   Wt 82.6 kg   LMP  (LMP Unknown)   SpO2 94%   BMI 29.39 kg/m  Vital signs in last 24 hours: Temp:  [97.6 F (36.4 C)-98.7 F (37.1 C)] 97.7 F (36.5 C) (09/24 0733) Pulse Rate:  [56-87] 79 (09/24 1215) Resp:  [15-24] 16 (09/24 1215) BP: (94-187)/(34-86) 177/74 (09/24 1215) SpO2:  [87 %-100 %] 94 % (09/24 1215) Weight:  [82.6 kg] 82.6 kg (09/23 1323)  GENERAL: Awake, alert in NAD HEENT: - Normocephalic and atraumatic, dry mm,  LUNGS - Clear to auscultation bilaterally with no wheezes CV - S1S2 RRR, no m/r/g, equal pulses bilaterally. ABDOMEN - Soft, nontender, nondistended with normoactive BS Ext: warm, well perfused, intact peripheral pulses, no edema  NEURO:  Mental Status: AA&Ox3. HOH, no aphasia or dysarthria. Able to follow commands and repeat Language: speech is clear.  Naming, repetition, fluency, and comprehension intact. Cranial Nerves: PERRL. Right eye ptosis. EOMI, visual fields full, no facial asymmetry, facial sensation intact, hearing intact, tongue/uvula/soft palate midline, normal sternocleidomastoid and trapezius muscle strength. No evidence of tongue atrophy or fasciculations Motor: 5/5 in uppers and 4/5 in lowers  Tone: is normal and bulk is normal Sensation- abnormal on bilateral lowers up to knees due to neuropathy Coordination: FTN intact bilaterally, no ataxia in BLE. Gait- deferred   Medications  Current Facility-Administered Medications:    0.9 %  sodium chloride infusion, , Intravenous, Continuous, Kayleen Memos, DO, Last Rate: 50 mL/hr at 09/14/22 4132, New Bag at 09/14/22 4401   apixaban (ELIQUIS) tablet 5 mg, 5 mg, Oral, BID, Hall, Carole N, DO, 5 mg at 09/14/22 0946   ezetimibe (ZETIA) tablet 10 mg, 10 mg, Oral, Daily, Hall,  Carole N, DO, 10 mg at 09/14/22 0946   fenofibrate tablet 54 mg, 54 mg, Oral, Daily, Hall, Carole N, DO, 54 mg at 09/14/22 0946   fluticasone furoate-vilanterol (BREO ELLIPTA) 100-25 MCG/ACT 1 puff, 1 puff, Inhalation, Daily, Hall, Carole N, DO, 1 puff at 09/14/22 0735   levothyroxine (SYNTHROID) tablet 50 mcg, 50 mcg, Oral, QAC breakfast, Irene Pap N, DO, 50 mcg at 09/14/22 0272   pantoprazole (PROTONIX) EC tablet 40 mg, 40 mg, Oral, Daily, Hall, Carole N, DO, 40 mg at 09/14/22 5366  Current Outpatient Medications:    albuterol (VENTOLIN HFA) 108 (90 Base) MCG/ACT inhaler, Inhale 1 puff into the lungs every 6 (six) hours as needed for shortness of breath., Disp: , Rfl:    Alcohol Swabs (B-D SINGLE USE SWABS REGULAR) PADS, , Disp: , Rfl:    apixaban (ELIQUIS) 5 MG TABS tablet, Take 1 tablet (5 mg total) by mouth 2 (two) times daily. Kindly provide 1 month supply with the free 1 month coupon, future supplies per PCP, Disp: 60 tablet, Rfl: 0   Blood Glucose Monitoring Suppl (TRUE METRIX AIR GLUCOSE METER) w/Device KIT, , Disp: , Rfl:    BREO ELLIPTA 100-25 MCG/ACT AEPB, Inhale 1 puff into the lungs daily., Disp: , Rfl:    carvedilol (COREG) 6.25 MG tablet, Take 1 tablet (6.25 mg total) by mouth 2 (two) times daily., Disp: 60 tablet, Rfl: 0   Elastic Bandages & Supports (ABDOMINAL BINDER/ELASTIC XL) MISC, 1 Device by Does not  apply route as needed (to prevent abdominal pain from hernia)., Disp: 1 each, Rfl: 0   ezetimibe (ZETIA) 10 MG tablet, Take 1 tablet (10 mg total) by mouth daily., Disp: 30 tablet, Rfl: 0   fenofibrate (TRICOR) 145 MG tablet, Take 1 tablet (145 mg total) by mouth daily., Disp: 90 tablet, Rfl: 3   fluticasone (FLOVENT HFA) 220 MCG/ACT inhaler, Inhale 1 puff into the lungs daily as needed (For shortness of breath)., Disp: , Rfl:    furosemide (LASIX) 40 MG tablet, Take 1 tablet (40 mg total) by mouth 2 (two) times daily. (Patient taking differently: Take 40 mg by mouth daily.),  Disp: 60 tablet, Rfl: 11   gabapentin (NEURONTIN) 300 MG capsule, Take 300 mg by mouth 3 (three) times daily., Disp: , Rfl:    glucose blood (TRUE METRIX BLOOD GLUCOSE TEST) test strip, TEST TWO TIMES DAILY, Disp: , Rfl:    isosorbide mononitrate (IMDUR) 30 MG 24 hr tablet, Take 2 tablets (60 mg total) by mouth daily., Disp: 30 tablet, Rfl: 0   levothyroxine (SYNTHROID) 50 MCG tablet, Take 50 mcg by mouth daily before breakfast., Disp: , Rfl:    metFORMIN (GLUCOPHAGE) 500 MG tablet, Take 500 mg by mouth daily with breakfast., Disp: , Rfl:    nitroGLYCERIN (NITROSTAT) 0.4 MG SL tablet, Place 1 tablet (0.4 mg total) under the tongue every 5 (five) minutes as needed. (Patient taking differently: Place 0.4 mg under the tongue every 5 (five) minutes as needed for chest pain.), Disp: 75 tablet, Rfl: 3   omeprazole (PRILOSEC) 20 MG capsule, Take 20 mg by mouth daily., Disp: , Rfl:    rosuvastatin (CRESTOR) 10 MG tablet, Take 1 tablet (10 mg total) by mouth daily., Disp: 30 tablet, Rfl: 0   telmisartan (MICARDIS) 20 MG tablet, Take 1 tablet (20 mg total) by mouth daily., Disp: 30 tablet, Rfl: 0   topiramate (TOPAMAX) 25 MG tablet, Take 1 tablet (25 mg total) by mouth at bedtime., Disp: 30 tablet, Rfl: 0   TRUEplus Lancets 33G MISC, , Disp: , Rfl:   Labs CBC    Component Value Date/Time   WBC 8.0 09/14/2022 0514   RBC 4.48 09/14/2022 0514   HGB 12.8 09/14/2022 0514   HGB 13.4 01/26/2020 1621   HCT 40.0 09/14/2022 0514   HCT 40.1 01/26/2020 1621   PLT 258 09/14/2022 0514   PLT 295 01/26/2020 1621   MCV 89.3 09/14/2022 0514   MCV 83 01/26/2020 1621   MCH 28.6 09/14/2022 0514   MCHC 32.0 09/14/2022 0514   RDW 13.8 09/14/2022 0514   RDW 14.1 01/26/2020 1621   LYMPHSABS 2.6 09/13/2022 1332   MONOABS 0.8 09/13/2022 1332   EOSABS 0.2 09/13/2022 1332   BASOSABS 0.1 09/13/2022 1332    CMP     Component Value Date/Time   NA 139 09/14/2022 0511   NA 144 01/26/2020 1621   K 4.2 09/14/2022 0511    CL 103 09/14/2022 0511   CO2 26 09/14/2022 0511   GLUCOSE 189 (H) 09/14/2022 0511   BUN 23 09/14/2022 0511   BUN 12 01/26/2020 1621   CREATININE 1.01 (H) 09/14/2022 0511   CALCIUM 9.1 09/14/2022 0511   PROT 6.3 (L) 09/11/2022 1616   PROT 6.5 01/26/2020 1621   ALBUMIN 3.3 (L) 09/11/2022 1616   ALBUMIN 4.3 01/26/2020 1621   AST 17 09/11/2022 1616   ALT 12 09/11/2022 1616   ALKPHOS 37 (L) 09/11/2022 1616   BILITOT 0.4 09/11/2022 1616   BILITOT <  0.2 01/26/2020 1621   GFRNONAA 54 (L) 09/14/2022 0511   GFRAA 64 01/26/2020 1621    glycosylated hemoglobin  Lipid Panel     Component Value Date/Time   CHOL 157 09/14/2022 0747   CHOL 223 (H) 01/26/2020 1621   TRIG 88 09/14/2022 0747   HDL 35 (L) 09/14/2022 0747   HDL 33 (L) 01/26/2020 1621   CHOLHDL 4.5 09/14/2022 0747   VLDL 18 09/14/2022 0747   LDLCALC 104 (H) 09/14/2022 0747   LDLCALC 93 01/26/2020 1621   LDLDIRECT 113.4 05/31/2013 1425     Imaging I have reviewed images in epic and the results pertinent to this consultation are:  CT-scan of the brain 1. No acute abnormality. 2. Stable mild atrophy and mild chronic small-vessel white matter ischemic changes. 3. Stable small, oval mass at the medial aspect of the upper right globe.  MRI examination of the brain 1. No acute intracranial abnormality or significant interval change. 2. Stable atrophy and white matter disease. This likely reflects the sequela of chronic microvascular ischemia. 3. 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   EEG  This study during sleep only is within normal limits. No seizures or epileptiform discharges were seen throughout the recording. A normal interictal EEG does not exclude nor support the diagnosis of epilepsy. If suspicion for interictal activity remains a concern, a repeat study can be considered  Assessment: 88 year old woman with above past medical history presenting for the third time in the past couple  of weeks with headaches and left-sided numbness and weakness.  Impression: Complex Migraine   Recommendations: - Recommend outpatient follow up with neurology and ophthalmology, which she already has appointments for  - Continue Topamax 25 mg at night for 1 week followed by escalations by 25 mg every week until a 50 mg twice daily dosing is reached  Beulah Gandy DNP, ACNPC-AG    Electronically signed: Dr. Kerney Elbe

## 2022-09-14 NOTE — Evaluation (Signed)
Occupational Therapy Evaluation Patient Details Name: Briana Hartman MRN: [id number] DOB: 1934-08-12 Today's Date: 09/14/2022   History of Present Illness 88 yo female presents to [facility] on 09/13/2022 with HA, L weakness and numbness, and syncope. Of note pt recently discharged 09/12/2022 with negative workup for CVA. MRI 9/23 negative. PMH includes HTN, HLD, CKDIII, CAD s/p PCI, COPD.   Clinical Impression   Pt with recent hospital admission. At baseline, lives with her son and plans to [place] to the beach to live with her other son for the next 6 months. Pt has recently lost her husband of 66 yrs, where she was the primary caregiver. Son and pt state she is "exhausted". Recent decline in strength and functional mobility, increasing her fall risk. Recommend DC home with [place]. Educated pt/son on strategies to reduce risk of falls. Acute OT to follow.      Recommendations for follow up therapy are one component of a multi-disciplinary discharge planning process, led by the attending physician.  Recommendations may be updated based on patient status, additional functional criteria and insurance authorization.   Follow Up Recommendations  Home health OT    Assistance Recommended at Discharge Frequent or constant Supervision/Assistance  Patient can return home with the following A little help with walking and/or transfers;A little help with bathing/dressing/bathroom;Assistance with cooking/housework;Direct supervision/assist for financial management;Direct supervision/assist for medications management;Help with stairs or ramp for entrance;Assist for transportation    Functional Status Assessment  Patient has had a recent decline in their functional status and demonstrates the ability to make significant improvements in function in a reasonable and predictable amount of time.  Equipment Recommendations  None recommended by OT    Recommendations for Other Services       Precautions /  Restrictions Precautions Precautions: Fall Restrictions Weight Bearing Restrictions: No      Mobility Bed Mobility Overal bed mobility: Needs Assistance Bed Mobility: Supine to Sit     Supine to sit: Supervision Sit to supine: Min guard   General bed mobility comments: Min A for trunk elevation to come to sitting. Min guard for safety to return to supine on stretcher.    Transfers Overall transfer level: Needs assistance Equipment used: Rolling walker (2 wheels) Transfers: Bed to chair/wheelchair/BSC, Sit to/from Stand Sit to Stand: Min assist Stand pivot transfers: Min assist   Step pivot transfers: Min assist     General transfer comment: initially unsteady, however improved as session progressed      Balance Overall balance assessment: Needs assistance Sitting-balance support: Feet supported Sitting balance-Leahy Scale: Good Sitting balance - Comments: sits unsupported at EOB, able to reach down to adjust socks   Standing balance support: Reliant on assistive device for balance Standing balance-Leahy Scale: Poor Standing balance comment: Reliant on at least 1 UE support                           ADL either performed or assessed with clinical judgement   ADL Overall ADL's : Needs assistance/impaired Eating/Feeding: Supervision/ safety   Grooming: Min guard;Standing   Upper Body Bathing: Min guard   Lower Body Bathing: Minimal assistance   Upper Body Dressing : Supervision/safety;Sitting   Lower Body Dressing: Sit to/from stand;Minimal assistance   Toilet Transfer: Minimal assistance   Toileting- Clothing Manipulation and Hygiene: Min guard;Sit to/from stand       Functional mobility during ADLs: Rolling walker (2 wheels);Minimal assistance       Vision Baseline Vision/History:  1 Wears glasses Patient Visual Report: No change from baseline       Perception     Praxis      Pertinent Vitals/Pain Pain Assessment Faces Pain Scale:  No hurt     Hand Dominance Right   Extremity/Trunk Assessment         Cervical / Trunk Assessment Cervical / Trunk Assessment: Kyphotic   Communication Communication Communication: No difficulties   Cognition Arousal/Alertness: Awake/alert Behavior During Therapy: WFL for tasks assessed/performed Overall Cognitive Status: Within Functional Limits for tasks assessed Area of Impairment: Attention, Orientation, Memory, Following commands, Awareness                 Orientation Level: Situation Current Attention Level: Focused Memory: Decreased short-term memory Following Commands: Follows one step commands with increased time   Awareness: Emergent   General Comments: Pleasant throughout. Following simple cues throughout.     General Comments       Exercises     Shoulder Instructions      Home Living Family/patient expects to be discharged to:: Private residence Living Arrangements: Children Available Help at Discharge: Family Type of Home: House Home Access: Level entry     Home Layout: One level     Bathroom Shower/Tub: Occupational psychologist: Standard Bathroom Accessibility: Yes How Accessible: Accessible via walker Home Equipment: Tub bench;Rollator (4 wheels)   Additional Comments: Recently moved in with her son  Lives With: Son (recently moved in with him; plans to go to the beach with her other son)    Prior Functioning/Environment Prior Level of Function : Independent/Modified Independent             Mobility Comments: uses rollator ADLs Comments: does ADLs, was doing IADLs until she moved in with son (~3 weeks ago)        OT Problem List: Decreased range of motion;Decreased strength;Decreased activity tolerance;Impaired balance (sitting and/or standing);Decreased safety awareness;Decreased knowledge of use of DME or AE;Decreased cognition      OT Treatment/Interventions: Self-care/ADL training;DME and/or AE  instruction;Therapeutic activities;Balance training;Patient/family education;Therapeutic exercise    OT Goals(Current goals can be found in the care plan section) Acute Rehab OT Goals Patient Stated Goal: return home and go to the beach with her son OT Goal Formulation: With patient Time For Goal Achievement: 09/28/22 Potential to Achieve Goals: Good  OT Frequency: Min 2X/week    Co-evaluation              AM-PAC OT "6 Clicks" Daily Activity     Outcome Measure Help from another person eating meals?: None Help from another person taking care of personal grooming?: A Little Help from another person toileting, which includes using toliet, bedpan, or urinal?: A Little Help from another person bathing (including washing, rinsing, drying)?: A Little Help from another person to put on and taking off regular upper body clothing?: A Little Help from another person to put on and taking off regular lower body clothing?: A Little 6 Click Score: 19   End of Session Equipment Utilized During Treatment: Gait belt Nurse Communication: Mobility status  Activity Tolerance: Patient tolerated treatment well Patient left: with call bell/phone within reach;with family/visitor present;in bed  OT Visit Diagnosis: Unsteadiness on feet (R26.81);Other abnormalities of gait and mobility (R26.89);Muscle weakness (generalized) (M62.81)                Time: 8676-7209 OT Time Calculation (min): 26 min Charges:  OT General Charges $OT Visit: 1 Visit OT Evaluation $  OT Eval Moderate Complexity: [place], OT/L   Acute OT Clinical Specialist Acute Rehabilitation Services Pager [phone] Office [phone]   [facility] 09/14/2022, 12:12 PM

## 2022-09-14 NOTE — Evaluation (Signed)
Physical Therapy Evaluation Patient Details Name: Briana Hartman MRN: [id number] DOB: Mar 10, 1934 Today's Date: 09/14/2022  History of Present Illness  Pt is an 88 y/o female admitted secondary to aphasia. MRI negative. Recent admission for TIA. PMH inlcudes factor V leiden mutation with hx of PE, DM2, CAD with PCI (2022), HTN, HLD, CKD III, hypothyroidism, COPD.  Clinical Impression  Pt presents to PT not far from her recent baseline, ablwe to ambulate with UE support of RW for household distances. Prior to recent weeks the pt was able to ambulate without use of an assistive device, she would like to attempt to get back to this level. Pt has significant sensation deficits in both feet and ankles due to peripheral neuropathy which add to her balance and gait deficits. PT recommends discharge home with continued HHPT.     Recommendations for follow up therapy are one component of a multi-disciplinary discharge planning process, led by the attending physician.  Recommendations may be updated based on patient status, additional functional criteria and insurance authorization.  Follow Up Recommendations Home health PT      Assistance Recommended at Discharge Intermittent Supervision/Assistance  Patient can return home with the following  A little help with walking and/or transfers;A little help with bathing/dressing/bathroom;Assistance with cooking/housework;Assist for transportation    Equipment Recommendations None recommended by PT  Recommendations for Other Services       Functional Status Assessment Patient has had a recent decline in their functional status and demonstrates the ability to make significant improvements in function in a reasonable and predictable amount of time.     Precautions / Restrictions Precautions Precautions: Fall Restrictions Weight Bearing Restrictions: No      Mobility  Bed Mobility Overal bed mobility: Needs Assistance Bed Mobility: Supine to  Sit, Sit to Supine     Supine to sit: Min guard Sit to supine: Min guard        Transfers Overall transfer level: Needs assistance Equipment used: Rolling walker (2 wheels) Transfers: Sit to/from Stand Sit to Stand: Min guard                Ambulation/Gait Ambulation/Gait assistance: Min guard Gait Distance (Feet): 80 Feet Assistive device: Rolling walker (2 wheels) Gait Pattern/deviations: Step-through pattern Gait velocity: reduced Gait velocity interpretation: <1.8 ft/sec, indicate of risk for recurrent falls   General Gait Details: slowed step-through gait  Stairs            Wheelchair Mobility    Modified Rankin (Stroke Patients Only)       Balance Overall balance assessment: Needs assistance Sitting-balance support: No upper extremity supported, Feet supported Sitting balance-Leahy Scale: Good     Standing balance support: Reliant on assistive device for balance Standing balance-Leahy Scale: Poor Standing balance comment: Reliant on at least 1 UE support                             Pertinent Vitals/Pain Pain Assessment Pain Assessment: No/denies pain    Home Living Family/patient expects to be discharged to:: Private residence Living Arrangements: Children Available Help at Discharge: Family Type of Home: House Home Access: Level entry       Home Layout: One level Home Equipment: Tub bench;Rollator (4 wheels) Additional Comments: Recently moved in with her son    Prior Function Prior Level of Function : Independent/Modified Independent             Mobility Comments: ambulates with RW  within home, rollator outside ADLs Comments: does ADLs, was doing IADLs until she moved in with son (~3 weeks ago)     Hand Dominance   Dominant Hand: Right    Extremity/Trunk Assessment   Upper Extremity Assessment Upper Extremity Assessment: Overall WFL for tasks assessed    Lower Extremity Assessment Lower Extremity  Assessment: RLE deficits/detail;LLE deficits/detail RLE Sensation: history of peripheral neuropathy LLE Sensation: history of peripheral neuropathy    Cervical / Trunk Assessment Cervical / Trunk Assessment: Kyphotic  Communication   Communication: No difficulties  Cognition Arousal/Alertness: Awake/alert Behavior During Therapy: WFL for tasks assessed/performed Overall Cognitive Status: Within Functional Limits for tasks assessed                                          General Comments General comments (skin integrity, edema, etc.): VSS on RA    Exercises     Assessment/Plan    PT Assessment Patient needs continued PT services  PT Problem List Decreased strength;Decreased mobility;Decreased balance;Decreased knowledge of use of DME;Decreased safety awareness;Decreased activity tolerance;Cardiopulmonary status limiting activity       PT Treatment Interventions DME instruction;Therapeutic activities;Gait training;Therapeutic exercise;Patient/family education;Balance training;Stair training;Functional mobility training    PT Goals (Current goals can be found in the Care Plan section)  Acute Rehab PT Goals Patient Stated Goal: to go home PT Goal Formulation: With patient Time For Goal Achievement: 09/28/22 Potential to Achieve Goals: Good    Frequency Min 3X/week     Co-evaluation               AM-PAC PT "6 Clicks" Mobility  Outcome Measure Help needed turning from your back to your side while in a flat bed without using bedrails?: None Help needed moving from lying on your back to sitting on the side of a flat bed without using bedrails?: A Little Help needed moving to and from a bed to a chair (including a wheelchair)?: A Little Help needed standing up from a chair using your arms (e.g., wheelchair or bedside chair)?: A Little Help needed to walk in hospital room?: A Little Help needed climbing 3-5 steps with a railing? : A Little 6 Click  Score: 19    End of Session   Activity Tolerance: Patient tolerated treatment well Patient left: in bed;with call bell/phone within reach;with family/visitor present Nurse Communication: Mobility status PT Visit Diagnosis: Unsteadiness on feet (R26.81);Muscle weakness (generalized) (M62.81)    Time: 1751-0258 PT Time Calculation (min) (ACUTE ONLY): 21 min   Charges:   PT Evaluation $PT Eval Low Complexity: [place], PT, DPT Acute Rehabilitation Office [phone]   Zenaida Niece 09/14/2022, 12:22 PM

## 2022-09-25 NOTE — Progress Notes (Signed)
[facility] [address] [place]. [place] [address] [phone]       HOSPITAL FOLLOW UP NOTE  Ms. Briana Hartman Date of Birth:  1934-08-04 Medical Record Number:  [id number]    Primary neurologist: Dr. Leonie Man Reason for Referral:  hospital stroke follow up    SUBJECTIVE:   CHIEF COMPLAINT:  Chief Complaint  Patient presents with   Follow-up    Rm 3 with son Konrad Dolores Pt is well and stable, per son. No residual side affects per son     HPI:   Briana Hartman is a 88 y.o. female with history of factor V Leiden mutation/PE, DM II, HTN, HLD CKD stage IIIa CAD s/p PCI 2022, COPD and hypothyroidism who presented on 09/04/2022 with right-sided facial droop and slurred speech for 30 minutes.  Evaluated by Dr. Erlinda Hong for likely TIA possibly related to subtherapeutic INR on warfarin for factor V Leyden deficiency and history of PE.  MRI negative for acute infarct.  Decided to change warfarin to Eliquis given difficulty with INR control.  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  Also found to have UTI and treated with Rocephin.  Therapies recommended home health PT/OT and discharged home.  She returned on 09/11/2022 with acute onset of aphasia, weakness, and confusion followed by severe left side headache 15-20 min later while in the car with her son. Of note, recently lost her husband 1 month ago and has moved in with her son.  Aphasia improved in ED but generalized weakness persisted.  Evaluated by Dr. Erlinda Hong, stroke work-up unremarkable, ESR and CRP negative, felt symptoms likely in setting of complicated migraine/tension headache in setting of grieving.  Previously on Lexapro but tapered off due to drug to drug interaction with warfarin, now that patient on Eliquis, recommended to resume Lexapro for depression.  Return to ED 9/23 with headache,  visual changes and syncope as well as mild left-sided weakness on exam.  Provided Reglan, fentanyl and magnesium but without great benefit.  MRI negative for acute findings.  EEG negative.  No further syncopal events, completed full cardiac work-up which was largely unremarkable.  Felt symptoms likely in setting of complicated migraine, placed on topiramate     Today, 09/29/2022, patient is being seen for initial follow-up accompanied by her son.  Overall stable since above admissions.  Remains on topiramate 25 mg nightly, denies side effects. Controlled headaches well. Did have 1 headache on 9/25 after ophthalmology visit but no additional headaches or stroke/TIA symptoms since that time.  Does have follow-up visit with ophthalmology in December.  Remains on eliquis, Zetia, Tricor and Crestor, denies side effects. Blood pressure today initially elevated but on recheck, 122/64. Blood pressure at home typically 130s/80s. Has f/u visit today with PCP. Plans on going to stay with her other son at [facility] for the next month, rotates staying with both her sons, she is looking forward to going to stay with him for a little while. No further concerns at this time.      PERTINENT IMAGING/LABS  Per hospitalization 09/04/2022 -09/07/2022 Code Stroke CT head No acute abnormality. ASPECTS 10.    CTA head & neck  intracranial atherosclerosis including severe distal right M1 and moderate right A2 stenosis.  Cervical carotid artery atherosclerosis without significant stenosis MRI no acute infarct.  9 mm FLAIR hyperintense lesion at the medial aspect of the right globe,  stable, and indeterminate. 2D Echo EF 60-65% LDL 59 HgbA1c 6.5   Per hospitalization 09/11/2022 -09/12/2022 CT no acute finding MRI no acute infarct CRP 1.1 ESR 20    ROS:   14 system review of systems performed and negative with exception of those listed in HPI  PMH:  Past Medical History:  Diagnosis Date   ACID REFLUX DISEASE     Adenomatous colon polyp    Allergy    Anemia    Anxiety    Arthritis    Asthma    Blood transfusion without reported diagnosis    Cataract    Cervical radiculopathy    COPD (chronic obstructive pulmonary disease) ([place])    Diabetes mellitus    Enlarged heart    born with   Fundic gland polyps of stomach, benign 03/06/2015   Hyperlipidemia    Hyperplastic polyp of stomach 03/06/2015   HYPERTENSION    HYPOTHYROIDISM    Irritable bowel syndrome    Long term current use of anticoagulant    Lumbar spinal stenosis    MITRAL VALVE PROLAPSE    Peripheral neuropathy    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, chronic anticaog   Skin cancer    VENTRAL HERNIA     PSH:  Past Surgical History:  Procedure Laterality Date   ABDOMINAL HYSTERECTOMY     APPENDECTOMY     CATARACT EXTRACTION Right    CESAREAN SECTION     CHOLECYSTECTOMY     COLONOSCOPY  05/24/2004   interal hemorrhoids, diverticulosis   UPPER GASTROINTESTINAL ENDOSCOPY  12/31/2007   gastric polyp   VARICOSE VEIN SURGERY Right     Social History:  Social History   Socioeconomic History   Marital status: Married    Spouse name: Not on file   Number of children: 3   Years of education: Not on file   Highest education level: Not on file  Occupational History   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 [employer]   Tobacco Use   Smoking status: Never   Smokeless tobacco: Never  Vaping Use   Vaping Use: Never used  Substance and Sexual Activity   Alcohol use: No    Alcohol/week: 0.0 standard drinks of alcohol   Drug use: No   Sexual activity: Yes  Other Topics Concern   Not on file  Social History Narrative   Not on file   Social Determinants of Health   Financial Resource Strain: Not on file  Food Insecurity: No Food Insecurity (09/12/2022)   Hunger Vital Sign    Worried About Running Out of Food in the Last Year: Never true    Ran Out of Food in the Last Year: Never true  Transportation Needs: No Transportation Needs (09/12/2022)    PRAPARE - Transportation    Lack of Transportation (Medical): No    Lack of Transportation (Non-Medical): No  Physical Activity: Not on file  Stress: Not on file  Social Connections: Not on file  Intimate Partner Violence: Not At Risk (09/12/2022)   Humiliation, Afraid, Rape, and Kick questionnaire    Fear of Current or Ex-Partner: No    Emotionally Abused: No    Physically Abused: No    Sexually Abused: No    Family History:  Family History  Problem Relation Age of Onset   Heart disease Mother    Clotting disorder Mother    Pancreatic cancer Brother    Pancreatic disease Sister    Pancreatic cancer Sister    Breast cancer Sister  Diabetes Sister        x 3   Heart disease Sister    Diabetes Brother        x 3   Cancer Brother        lower bowel   Liver cancer Sister     Medications:   Current Outpatient Medications on File Prior to Visit  Medication Sig Dispense Refill   albuterol (VENTOLIN HFA) 108 (90 Base) MCG/ACT inhaler Inhale 1 puff into the lungs every 6 (six) hours as needed for shortness of breath.     Alcohol Swabs (B-D SINGLE USE SWABS REGULAR) PADS      apixaban (ELIQUIS) 5 MG TABS tablet Take 1 tablet (5 mg total) by mouth 2 (two) times daily. Kindly provide 1 month supply with the free 1 month coupon, future supplies per PCP 60 tablet 0   Blood Glucose Monitoring Suppl (TRUE METRIX AIR GLUCOSE METER) w/Device KIT      BREO ELLIPTA 100-25 MCG/ACT AEPB Inhale 1 puff into the lungs daily.     carvedilol (COREG) 6.25 MG tablet Take 1 tablet (6.25 mg total) by mouth 2 (two) times daily. 60 tablet 0   Elastic Bandages & Supports (ABDOMINAL BINDER/ELASTIC XL) MISC 1 Device by Does not apply route as needed (to prevent abdominal pain from hernia). 1 each 0   ezetimibe (ZETIA) 10 MG tablet Take 1 tablet (10 mg total) by mouth daily. 30 tablet 0   fenofibrate (TRICOR) 145 MG tablet Take 1 tablet (145 mg total) by mouth daily. 90 tablet 3   fluticasone (FLOVENT HFA)  220 MCG/ACT inhaler Inhale 1 puff into the lungs daily as needed (For shortness of breath).     furosemide (LASIX) 40 MG tablet Take 1 tablet (40 mg total) by mouth 2 (two) times daily. (Patient taking differently: Take 40 mg by mouth daily.) 60 tablet 11   gabapentin (NEURONTIN) 300 MG capsule Take 300 mg by mouth 3 (three) times daily.     glucose blood (TRUE METRIX BLOOD GLUCOSE TEST) test strip TEST TWO TIMES DAILY     isosorbide mononitrate (IMDUR) 30 MG 24 hr tablet Take 2 tablets (60 mg total) by mouth daily. 30 tablet 0   levothyroxine (SYNTHROID) 50 MCG tablet Take 50 mcg by mouth daily before breakfast.     metFORMIN (GLUCOPHAGE) 500 MG tablet Take 500 mg by mouth daily with breakfast.     nitroGLYCERIN (NITROSTAT) 0.4 MG SL tablet Place 1 tablet (0.4 mg total) under the tongue every 5 (five) minutes as needed. (Patient taking differently: Place 0.4 mg under the tongue every 5 (five) minutes as needed for chest pain.) 75 tablet 3   omeprazole (PRILOSEC) 20 MG capsule Take 20 mg by mouth daily.     rosuvastatin (CRESTOR) 10 MG tablet Take 1 tablet (10 mg total) by mouth daily. 30 tablet 0   telmisartan (MICARDIS) 20 MG tablet Take 1 tablet (20 mg total) by mouth daily. 30 tablet 0   TRUEplus Lancets 33G MISC      No current facility-administered medications on file prior to visit.    Allergies:   Allergies  Allergen Reactions   Aspirin     unknown   Caffeine     unknown   Codeine Hives   Hydroxyzine     Other reaction(s): Other (See Comments) Nightmares and shaky hands   Lipitor [Atorvastatin] Other (See Comments)    Headache, numbness   Ampicillin Rash   Ketoconazole Rash    Topical  cream caused rash   Penicillins Rash      OBJECTIVE:  Physical Exam  Vitals:   09/29/22 0904  BP: 122/64  Pulse: 67  Weight: 173 lb (78.5 kg)  Height: 5' 5"  (1.651 m)   Body mass index is 28.79 kg/m. No results found.   General: well developed, well nourished, very pleasant  elderly Caucasian female, seated, in no evident distress Head: head normocephalic and atraumatic.   Neck: supple with no carotid or supraclavicular bruits Cardiovascular: regular rate and rhythm, no murmurs Musculoskeletal: no deformity Skin:  no rash/petichiae Vascular:  Normal pulses all extremities   Neurologic Exam Mental Status: Awake and fully alert.  Fluent speech and language.  Oriented to place and time. Recent and remote memory intact. Attention span, concentration and fund of knowledge appropriate. Mood and affect appropriate.  Cranial Nerves: Fundoscopic exam reveals sharp disc margins. Pupils equal, briskly reactive to light. Extraocular movements full without nystagmus. Visual fields full to confrontation. Hearing intact. Facial sensation intact. Face, tongue, palate moves normally and symmetrically.  Motor: Normal bulk and tone. Normal strength in all tested extremity muscles Sensory.: intact to touch , pinprick , position and vibratory sensation.  Coordination: Rapid alternating movements normal in all extremities. Finger-to-nose and heel-to-shin performed accurately bilaterally. Gait and Station: Arises from chair without difficulty. Stance is normal. Antalgic gait, holds on to sons arm for assistance.  Tandem walk and heel toe not attempted Reflexes: 1+ and symmetric. Toes downgoing.       ASSESSMENT: Briana Hartman is a 88 y.o. year old female with TIA 09/04/2022 possibly in setting of subtherapeutic INR on warfarin for factor V Leyden history of PE, switched to Eliquis at that time.  Readmission on 09/11/2022 and 2/84 for likely complicated migraine/tension headache in setting of grieving after recent passing of her husband.  Vascular risk factors include HTN, HLD, DM, advanced age, CAD, CHF and factor V Leyden with history of PE.      PLAN:  Strokelike episode:  Suspect more in setting of complicated migraine - continue topiramate 25 mg nightly. Use of as needed  for acute management. Advised to call if headaches should worsen. Refill provided.  Continue Eliquis 63m twice daily and Crestor, Zetia 160mdaily and fenofibrate  for stroke prevention.   Discussed stroke prevention measures and importance of close PCP follow up for aggressive stroke risk factor management including BP goal<130/90, HLD with LDL goal<70 and DM with A1c.<7 .  Stroke labs 08/2022: LDL 59, A1c 6.5 I have gone over the pathophysiology of stroke, warning signs and symptoms, risk factors and their management in some detail with instructions to go to the closest emergency room for symptoms of concern.    Follow up in 6 months or call earlier if needed    CC:  [facility] provider: Dr. SeLeonie ManCP: WeCurt JewsPA-C    I spent 57 minutes of face-to-face and non-face-to-face time with patient and son.  This included previsit chart review including review of recent hospitalization, lab review, study review, order entry, electronic health record documentation, patient education regarding recent stroke like episode including etiology, stroke prevention measures and importance of managing stroke risk factors, ongoing treatment plan for likely complicated migraine and answered all the questions to patient and son satisfaction   JeFrann RiderAGNP-BC  [facility] [address] [id number] [phone]: This document was prepared with digital dictation and possible smart phrase technology. Any transcriptional  errors that result from this process are unintentional.

## 2022-09-29 ENCOUNTER — Encounter: Payer: Self-pay | Admitting: Adult Health

## 2022-09-29 ENCOUNTER — Ambulatory Visit: Payer: Medicare HMO | Admitting: Adult Health

## 2022-09-29 VITALS — BP 122/64 | HR 67 | Ht 65.0 in | Wt 173.0 lb

## 2022-09-29 DIAGNOSIS — R2990 Unspecified symptoms and signs involving the nervous system: Secondary | ICD-10-CM | POA: Diagnosis not present

## 2022-09-29 DIAGNOSIS — Z09 Encounter for follow-up examination after completed treatment for conditions other than malignant neoplasm: Secondary | ICD-10-CM | POA: Diagnosis not present

## 2022-09-29 DIAGNOSIS — G43109 Migraine with aura, not intractable, without status migrainosus: Secondary | ICD-10-CM | POA: Diagnosis not present

## 2022-09-29 MED ORDER — TOPIRAMATE 25 MG PO TABS: 25.0000 mg | ORAL_TABLET | Freq: Every day | ORAL | 3 refills | Status: AC

## 2022-09-29 NOTE — Patient Instructions (Addendum)
Continue topamax '25mg'$  nightly - please let me know if you have any additional headaches or if headaches become more frequent   Continue Eliquis (apixaban) daily  and Crestor and Zetia  for secondary stroke prevention  Continue to follow up with PCP regarding cholesterol and blood pressure management  Maintain strict control of hypertension with blood pressure goal below 130/90 and cholesterol with LDL cholesterol (bad cholesterol) goal below 70 mg/dL.   Signs of a Stroke? Follow the BEFAST method:  Balance Watch for a sudden loss of balance, trouble with coordination or vertigo Eyes Is there a sudden loss of vision in one or both eyes? Or double vision?  Face: Ask the person to smile. Does one side of the face droop or is it numb?  Arms: Ask the person to raise both arms. Does one arm drift downward? Is there weakness or numbness of a leg? Speech: Ask the person to repeat a simple phrase. Does the speech sound slurred/strange? Is the person confused ? Time: If you observe any of these signs, call 911.     Followup in the future with me in 6 months or call earlier if needed       Thank you for coming to see [place] at [facility]. I hope we have been able to provide you high quality care today.  You may receive a patient satisfaction survey over the next few weeks. We would appreciate your feedback and comments so that we may continue to improve ourselves and the health of our patients.      Migraine Headache A migraine headache is a very strong throbbing pain on one side or both sides of your head. This type of headache can also cause other symptoms. It can last from 4 hours to 3 days. Talk with your doctor about what things may bring on (trigger) this condition. What are the causes? The exact cause of this condition is not known. This condition may be triggered or caused by: Drinking alcohol. Smoking. Taking medicines, such as: Medicine used to treat chest pain  (nitroglycerin). Birth control pills. Estrogen. Some blood pressure medicines. Eating or drinking certain products. Doing physical activity. Other things that may trigger a migraine headache include: Having a menstrual period. Pregnancy. Hunger. Stress. Not getting enough sleep or getting too much sleep. Weather changes. Tiredness (fatigue). What increases the risk? Being 34-24 years old. Being female. Having a family history of migraine headaches. Being Caucasian. Having depression or anxiety. Being very overweight. What are the signs or symptoms? A throbbing pain. This pain may: Happen in any area of the head, such as on one side or both sides. Make it hard to do daily activities. Get worse with physical activity. Get worse around bright lights or loud noises. Other symptoms may include: Feeling sick to your stomach (nauseous). Vomiting. Dizziness. Being sensitive to bright lights, loud noises, or smells. Before you get a migraine headache, you may get warning signs (an aura). An aura may include: Seeing flashing lights or having blind spots. Seeing bright spots, halos, or zigzag lines. Having tunnel vision or blurred vision. Having numbness or a tingling feeling. Having trouble talking. Having weak muscles. Some people have symptoms after a migraine headache (postdromal phase), such as: Tiredness. Trouble thinking (concentrating). How is this treated? Taking medicines that: Relieve pain. Relieve the feeling of being sick to your stomach. Prevent migraine headaches. Treatment may also include: Having acupuncture. Avoiding foods that bring on migraine headaches. Learning ways to control your body  functions (biofeedback). Therapy to help you know and deal with negative thoughts (cognitive behavioral therapy). Follow these instructions at home: Medicines Take over-the-counter and prescription medicines only as told by your doctor. Ask your doctor if the medicine  prescribed to you: Requires you to avoid driving or using heavy machinery. Can cause trouble pooping (constipation). You may need to take these steps to prevent or treat trouble pooping: Drink enough fluid to keep your pee (urine) pale yellow. Take over-the-counter or prescription medicines. Eat foods that are high in fiber. These include beans, whole grains, and fresh fruits and vegetables. Limit foods that are high in fat and sugar. These include fried or sweet foods. Lifestyle Do not drink alcohol. Do not use any products that contain nicotine or tobacco, such as cigarettes, e-cigarettes, and chewing tobacco. If you need help quitting, ask your doctor. Get at least 8 hours of sleep every night. Limit and deal with stress. General instructions     Keep a journal to find out what may bring on your migraine headaches. For example, write down: What you eat and drink. How much sleep you get. Any change in what you eat or drink. Any change in your medicines. If you have a migraine headache: Avoid things that make your symptoms worse, such as bright lights. It may help to lie down in a dark, quiet room. Do not drive or use heavy machinery. Ask your doctor what activities are safe for you. Keep all follow-up visits as told by your doctor. This is important. Contact a doctor if: You get a migraine headache that is different or worse than others you have had. You have more than 15 headache days in one month. Get help right away if: Your migraine headache gets very bad. Your migraine headache lasts longer than 72 hours. You have a fever. You have a stiff neck. You have trouble seeing. Your muscles feel weak or like you cannot control them. You start to lose your balance a lot. You start to have trouble walking. You pass out (faint). You have a seizure. Summary A migraine headache is a very strong throbbing pain on one side or both sides of your head. These headaches can also cause  other symptoms. This condition may be treated with medicines and changes to your lifestyle. Keep a journal to find out what may bring on your migraine headaches. Contact a doctor if you get a migraine headache that is different or worse than others you have had. Contact your doctor if you have more than 15 headache days in a month. This information is not intended to replace advice given to you by your health care provider. Make sure you discuss any questions you have with your health care provider. Document Revised: 04/01/2019 Document Reviewed: 01/20/2019 Elsevier Patient Education  [place].

## 2022-09-30 NOTE — Progress Notes (Signed)
I agree with the above plan 

## 2023-03-31 ENCOUNTER — Ambulatory Visit: Payer: Medicare HMO | Admitting: Adult Health
